# Patient Record
Sex: Female | Born: 1940 | Race: White | Hispanic: No | Smoking: Former smoker
Health system: Southern US, Community
[De-identification: ages and names within clinical notes are randomized; demographics above are authoritative.]

## PROBLEM LIST (undated history)

## (undated) DIAGNOSIS — C20 Malignant neoplasm of rectum: Secondary | ICD-10-CM

## (undated) DIAGNOSIS — J449 Chronic obstructive pulmonary disease, unspecified: Secondary | ICD-10-CM

## (undated) DIAGNOSIS — K922 Gastrointestinal hemorrhage, unspecified: Secondary | ICD-10-CM

## (undated) DIAGNOSIS — I10 Essential (primary) hypertension: Secondary | ICD-10-CM

## (undated) HISTORY — PX: HIP ARTHROPLASTY: SHX981

## (undated) HISTORY — PX: COLONOSCOPY: SHX174

## (undated) HISTORY — PX: RECTAL SURGERY: SHX760

---

## 2001-04-18 ENCOUNTER — Encounter: Payer: Self-pay | Admitting: Orthopedic Surgery

## 2001-04-18 ENCOUNTER — Ambulatory Visit: Admission: RE | Admit: 2001-04-18 | Discharge: 2001-04-18 | Payer: Self-pay | Admitting: Orthopedic Surgery

## 2001-04-28 ENCOUNTER — Ambulatory Visit (HOSPITAL_COMMUNITY): Admission: RE | Admit: 2001-04-28 | Discharge: 2001-04-28 | Payer: Self-pay | Admitting: Nephrology

## 2001-04-28 ENCOUNTER — Encounter: Payer: Self-pay | Admitting: Nephrology

## 2001-04-30 ENCOUNTER — Encounter: Payer: Self-pay | Admitting: Nephrology

## 2001-04-30 ENCOUNTER — Ambulatory Visit (HOSPITAL_COMMUNITY): Admission: RE | Admit: 2001-04-30 | Discharge: 2001-04-30 | Payer: Self-pay | Admitting: Nephrology

## 2006-12-23 ENCOUNTER — Ambulatory Visit: Payer: Self-pay | Admitting: Internal Medicine

## 2006-12-23 ENCOUNTER — Inpatient Hospital Stay (HOSPITAL_COMMUNITY): Admission: EM | Admit: 2006-12-23 | Discharge: 2007-01-17 | Payer: Self-pay | Admitting: Emergency Medicine

## 2006-12-24 ENCOUNTER — Encounter (INDEPENDENT_AMBULATORY_CARE_PROVIDER_SITE_OTHER): Payer: Self-pay | Admitting: Cardiology

## 2006-12-25 ENCOUNTER — Encounter (INDEPENDENT_AMBULATORY_CARE_PROVIDER_SITE_OTHER): Payer: Self-pay | Admitting: Specialist

## 2006-12-27 ENCOUNTER — Encounter (INDEPENDENT_AMBULATORY_CARE_PROVIDER_SITE_OTHER): Payer: Self-pay | Admitting: *Deleted

## 2006-12-30 ENCOUNTER — Ambulatory Visit: Payer: Self-pay | Admitting: Infectious Diseases

## 2007-01-09 ENCOUNTER — Ambulatory Visit: Payer: Self-pay | Admitting: Oncology

## 2007-01-14 ENCOUNTER — Encounter: Payer: Self-pay | Admitting: Oncology

## 2007-01-20 ENCOUNTER — Ambulatory Visit: Admission: RE | Admit: 2007-01-20 | Discharge: 2007-03-16 | Payer: Self-pay | Admitting: Radiation Oncology

## 2007-01-21 ENCOUNTER — Encounter: Payer: Self-pay | Admitting: Vascular Surgery

## 2007-01-21 ENCOUNTER — Ambulatory Visit (HOSPITAL_COMMUNITY): Admission: RE | Admit: 2007-01-21 | Discharge: 2007-01-21 | Payer: Self-pay | Admitting: Radiation Oncology

## 2007-01-21 ENCOUNTER — Ambulatory Visit: Payer: Self-pay | Admitting: Vascular Surgery

## 2007-01-21 LAB — CBC WITH DIFFERENTIAL/PLATELET
BASO%: 1.3 % (ref 0.0–2.0)
Eosinophils Absolute: 0.4 10*3/uL (ref 0.0–0.5)
LYMPH%: 15.7 % (ref 14.0–48.0)
MCHC: 32.3 g/dL (ref 32.0–36.0)
MONO#: 0.8 10*3/uL (ref 0.1–0.9)
NEUT#: 4.9 10*3/uL (ref 1.5–6.5)
Platelets: 147 10*3/uL (ref 145–400)
RBC: 4.23 10*6/uL (ref 3.70–5.32)
WBC: 7.4 10*3/uL (ref 3.9–10.0)
lymph#: 1.2 10*3/uL (ref 0.9–3.3)

## 2007-01-28 ENCOUNTER — Ambulatory Visit: Payer: Self-pay | Admitting: Oncology

## 2007-01-28 LAB — CBC & DIFF AND RETIC
BASO%: 1.5 % (ref 0.0–2.0)
Basophils Absolute: 0.1 10*3/uL (ref 0.0–0.1)
Eosinophils Absolute: 0.1 10*3/uL (ref 0.0–0.5)
HCT: 39.3 % (ref 34.8–46.6)
HGB: 12.6 g/dL (ref 11.6–15.9)
IRF: 0.4 — ABNORMAL HIGH (ref 0.130–0.330)
LYMPH%: 12.3 % — ABNORMAL LOW (ref 14.0–48.0)
MCHC: 32.1 g/dL (ref 32.0–36.0)
MONO#: 0.5 10*3/uL (ref 0.1–0.9)
NEUT#: 3 10*3/uL (ref 1.5–6.5)
NEUT%: 71.1 % (ref 39.6–76.8)
Platelets: 104 10*3/uL — ABNORMAL LOW (ref 145–400)
WBC: 4.2 10*3/uL (ref 3.9–10.0)
lymph#: 0.5 10*3/uL — ABNORMAL LOW (ref 0.9–3.3)

## 2007-01-28 LAB — IRON AND TIBC
Iron: 170 ug/dL — ABNORMAL HIGH (ref 42–145)
TIBC: 406 ug/dL (ref 250–470)

## 2007-01-28 LAB — COMPREHENSIVE METABOLIC PANEL
ALT: 37 U/L — ABNORMAL HIGH (ref 0–35)
CO2: 22 mEq/L (ref 19–32)
Calcium: 8.9 mg/dL (ref 8.4–10.5)
Chloride: 102 mEq/L (ref 96–112)
Creatinine, Ser: 0.69 mg/dL (ref 0.40–1.20)
Glucose, Bld: 112 mg/dL — ABNORMAL HIGH (ref 70–99)
Total Bilirubin: 0.9 mg/dL (ref 0.3–1.2)
Total Protein: 6.7 g/dL (ref 6.0–8.3)

## 2007-01-28 LAB — FERRITIN: Ferritin: 173 ng/mL (ref 10–291)

## 2007-01-28 LAB — CHCC SMEAR

## 2007-01-28 LAB — MORPHOLOGY

## 2007-02-04 LAB — COMPREHENSIVE METABOLIC PANEL
AST: 32 U/L (ref 0–37)
Alkaline Phosphatase: 176 U/L — ABNORMAL HIGH (ref 39–117)
BUN: 29 mg/dL — ABNORMAL HIGH (ref 6–23)
Creatinine, Ser: 0.85 mg/dL (ref 0.40–1.20)
Total Bilirubin: 0.9 mg/dL (ref 0.3–1.2)

## 2007-02-04 LAB — CBC WITH DIFFERENTIAL/PLATELET
BASO%: 0 % (ref 0.0–2.0)
Basophils Absolute: 0 10*3/uL (ref 0.0–0.1)
EOS%: 1.6 % (ref 0.0–7.0)
HCT: 36.7 % (ref 34.8–46.6)
HGB: 12.5 g/dL (ref 11.6–15.9)
MCH: 30.1 pg (ref 26.0–34.0)
MCHC: 34 g/dL (ref 32.0–36.0)
MCV: 88.3 fL (ref 81.0–101.0)
MONO%: 13.7 % — ABNORMAL HIGH (ref 0.0–13.0)
NEUT%: 72.5 % (ref 39.6–76.8)

## 2007-02-11 LAB — CBC WITH DIFFERENTIAL/PLATELET
Basophils Absolute: 0 10*3/uL (ref 0.0–0.1)
EOS%: 1.9 % (ref 0.0–7.0)
HCT: 36.5 % (ref 34.8–46.6)
HGB: 12.6 g/dL (ref 11.6–15.9)
LYMPH%: 6.5 % — ABNORMAL LOW (ref 14.0–48.0)
MCH: 31 pg (ref 26.0–34.0)
MCV: 90.2 fL (ref 81.0–101.0)
MONO%: 10.3 % (ref 0.0–13.0)
NEUT%: 81.3 % — ABNORMAL HIGH (ref 39.6–76.8)
Platelets: 102 10*3/uL — ABNORMAL LOW (ref 145–400)
lymph#: 0.3 10*3/uL — ABNORMAL LOW (ref 0.9–3.3)

## 2007-02-11 LAB — COMPREHENSIVE METABOLIC PANEL
AST: 31 U/L (ref 0–37)
BUN: 22 mg/dL (ref 6–23)
Calcium: 8.5 mg/dL (ref 8.4–10.5)
Chloride: 105 mEq/L (ref 96–112)
Creatinine, Ser: 0.77 mg/dL (ref 0.40–1.20)

## 2007-02-20 LAB — COMPREHENSIVE METABOLIC PANEL
BUN: 19 mg/dL (ref 6–23)
CO2: 22 mEq/L (ref 19–32)
Calcium: 8.7 mg/dL (ref 8.4–10.5)
Chloride: 107 mEq/L (ref 96–112)
Creatinine, Ser: 0.77 mg/dL (ref 0.40–1.20)

## 2007-02-20 LAB — CBC WITH DIFFERENTIAL/PLATELET
Basophils Absolute: 0 10*3/uL (ref 0.0–0.1)
HCT: 34 % — ABNORMAL LOW (ref 34.8–46.6)
HGB: 11.7 g/dL (ref 11.6–15.9)
MCH: 31.5 pg (ref 26.0–34.0)
MONO#: 0.3 10*3/uL (ref 0.1–0.9)
NEUT%: 82.8 % — ABNORMAL HIGH (ref 39.6–76.8)
WBC: 3.2 10*3/uL — ABNORMAL LOW (ref 3.9–10.0)
lymph#: 0.1 10*3/uL — ABNORMAL LOW (ref 0.9–3.3)

## 2007-02-28 LAB — BASIC METABOLIC PANEL
BUN: 20 mg/dL (ref 6–23)
CO2: 23 mEq/L (ref 19–32)
Chloride: 106 mEq/L (ref 96–112)
Creatinine, Ser: 0.79 mg/dL (ref 0.40–1.20)

## 2007-02-28 LAB — CBC WITH DIFFERENTIAL/PLATELET
BASO%: 0 % (ref 0.0–2.0)
Basophils Absolute: 0 10*3/uL (ref 0.0–0.1)
HCT: 34.6 % — ABNORMAL LOW (ref 34.8–46.6)
HGB: 12.3 g/dL (ref 11.6–15.9)
MONO#: 0.5 10*3/uL (ref 0.1–0.9)
NEUT%: 80.5 % — ABNORMAL HIGH (ref 39.6–76.8)
WBC: 3.7 10*3/uL — ABNORMAL LOW (ref 3.9–10.0)
lymph#: 0.1 10*3/uL — ABNORMAL LOW (ref 0.9–3.3)

## 2007-03-07 LAB — CBC WITH DIFFERENTIAL/PLATELET
Basophils Absolute: 0 10*3/uL (ref 0.0–0.1)
EOS%: 2.2 % (ref 0.0–7.0)
HGB: 12.3 g/dL (ref 11.6–15.9)
LYMPH%: 6.2 % — ABNORMAL LOW (ref 14.0–48.0)
MCH: 33 pg (ref 26.0–34.0)
MCV: 93.5 fL (ref 81.0–101.0)
MONO%: 8.4 % (ref 0.0–13.0)
Platelets: 116 10*3/uL — ABNORMAL LOW (ref 145–400)
RDW: 30 % — ABNORMAL HIGH (ref 11.3–14.5)

## 2007-03-07 LAB — COMPREHENSIVE METABOLIC PANEL
AST: 34 U/L (ref 0–37)
Alkaline Phosphatase: 183 U/L — ABNORMAL HIGH (ref 39–117)
BUN: 19 mg/dL (ref 6–23)
Creatinine, Ser: 0.7 mg/dL (ref 0.40–1.20)
Potassium: 3.5 mEq/L (ref 3.5–5.3)
Total Bilirubin: 0.9 mg/dL (ref 0.3–1.2)

## 2007-03-12 ENCOUNTER — Ambulatory Visit: Payer: Self-pay | Admitting: Oncology

## 2007-03-14 ENCOUNTER — Ambulatory Visit (HOSPITAL_COMMUNITY): Admission: RE | Admit: 2007-03-14 | Discharge: 2007-03-14 | Payer: Self-pay | Admitting: Oncology

## 2007-03-14 LAB — CBC WITH DIFFERENTIAL/PLATELET
Basophils Absolute: 0 10*3/uL (ref 0.0–0.1)
EOS%: 2.4 % (ref 0.0–7.0)
Eosinophils Absolute: 0.1 10*3/uL (ref 0.0–0.5)
HCT: 33.5 % — ABNORMAL LOW (ref 34.8–46.6)
HGB: 11.8 g/dL (ref 11.6–15.9)
MCH: 34 pg (ref 26.0–34.0)
MCV: 96.7 fL (ref 81.0–101.0)
MONO%: 7.9 % (ref 0.0–13.0)
NEUT#: 4 10*3/uL (ref 1.5–6.5)
NEUT%: 82.6 % — ABNORMAL HIGH (ref 39.6–76.8)
Platelets: 135 10*3/uL — ABNORMAL LOW (ref 145–400)

## 2007-03-14 LAB — URINALYSIS, MICROSCOPIC - CHCC
Blood: NEGATIVE
Glucose: NEGATIVE g/dL
Nitrite: NEGATIVE
Protein: <30 mg/dL

## 2007-03-14 LAB — COMPREHENSIVE METABOLIC PANEL
AST: 34 U/L (ref 0–37)
Albumin: 3.3 g/dL — ABNORMAL LOW (ref 3.5–5.2)
Alkaline Phosphatase: 204 U/L — ABNORMAL HIGH (ref 39–117)
BUN: 23 mg/dL (ref 6–23)
Calcium: 8.9 mg/dL (ref 8.4–10.5)
Creatinine, Ser: 0.77 mg/dL (ref 0.40–1.20)
Glucose, Bld: 114 mg/dL — ABNORMAL HIGH (ref 70–99)

## 2007-03-21 LAB — CBC WITH DIFFERENTIAL/PLATELET
Basophils Absolute: 0 10*3/uL (ref 0.0–0.1)
Eosinophils Absolute: 0.1 10*3/uL (ref 0.0–0.5)
HCT: 35 % (ref 34.8–46.6)
LYMPH%: 8.1 % — ABNORMAL LOW (ref 14.0–48.0)
MCV: 98.8 fL (ref 81.0–101.0)
MONO#: 1 10*3/uL — ABNORMAL HIGH (ref 0.1–0.9)
MONO%: 16.9 % — ABNORMAL HIGH (ref 0.0–13.0)
NEUT#: 4.1 10*3/uL (ref 1.5–6.5)
NEUT%: 72.5 % (ref 39.6–76.8)
Platelets: 162 10*3/uL (ref 145–400)
RBC: 3.54 10*6/uL — ABNORMAL LOW (ref 3.70–5.32)
WBC: 5.7 10*3/uL (ref 3.9–10.0)

## 2007-03-21 LAB — COMPREHENSIVE METABOLIC PANEL
Alkaline Phosphatase: 238 U/L — ABNORMAL HIGH (ref 39–117)
BUN: 16 mg/dL (ref 6–23)
CO2: 23 mEq/L (ref 19–32)
Creatinine, Ser: 0.72 mg/dL (ref 0.40–1.20)
Glucose, Bld: 112 mg/dL — ABNORMAL HIGH (ref 70–99)
Sodium: 136 mEq/L (ref 135–145)
Total Bilirubin: 1.2 mg/dL (ref 0.3–1.2)
Total Protein: 6.1 g/dL (ref 6.0–8.3)

## 2007-04-01 ENCOUNTER — Ambulatory Visit: Payer: Self-pay | Admitting: Internal Medicine

## 2007-04-01 ENCOUNTER — Encounter (INDEPENDENT_AMBULATORY_CARE_PROVIDER_SITE_OTHER): Payer: Self-pay | Admitting: Surgery

## 2007-04-01 ENCOUNTER — Inpatient Hospital Stay (HOSPITAL_COMMUNITY): Admission: RE | Admit: 2007-04-01 | Discharge: 2007-05-26 | Payer: Self-pay | Admitting: Surgery

## 2007-04-21 ENCOUNTER — Encounter: Payer: Self-pay | Admitting: Surgery

## 2007-04-22 ENCOUNTER — Encounter (INDEPENDENT_AMBULATORY_CARE_PROVIDER_SITE_OTHER): Payer: Self-pay | Admitting: Surgery

## 2007-05-13 ENCOUNTER — Ambulatory Visit: Payer: Self-pay | Admitting: Physical Medicine & Rehabilitation

## 2007-05-26 ENCOUNTER — Ambulatory Visit: Payer: Self-pay | Admitting: Vascular Surgery

## 2007-07-08 ENCOUNTER — Ambulatory Visit: Payer: Self-pay | Admitting: Oncology

## 2007-07-10 LAB — CBC WITH DIFFERENTIAL/PLATELET
Basophils Absolute: 0.1 10*3/uL (ref 0.0–0.1)
Eosinophils Absolute: 0.1 10*3/uL (ref 0.0–0.5)
HCT: 33.2 % — ABNORMAL LOW (ref 34.8–46.6)
HGB: 11.1 g/dL — ABNORMAL LOW (ref 11.6–15.9)
LYMPH%: 22 % (ref 14.0–48.0)
MCV: 81.6 fL (ref 81.0–101.0)
MONO#: 0.4 10*3/uL (ref 0.1–0.9)
MONO%: 8.6 % (ref 0.0–13.0)
NEUT#: 2.8 10*3/uL (ref 1.5–6.5)
Platelets: 238 10*3/uL (ref 145–400)
RBC: 4.07 10*6/uL (ref 3.70–5.32)
WBC: 4.2 10*3/uL (ref 3.9–10.0)

## 2007-07-10 LAB — COMPREHENSIVE METABOLIC PANEL
ALT: 17 U/L (ref 0–35)
Albumin: 3 g/dL — ABNORMAL LOW (ref 3.5–5.2)
CO2: 22 mEq/L (ref 19–32)
Calcium: 9 mg/dL (ref 8.4–10.5)
Chloride: 106 mEq/L (ref 96–112)
Potassium: 4.1 mEq/L (ref 3.5–5.3)
Sodium: 139 mEq/L (ref 135–145)
Total Protein: 6.5 g/dL (ref 6.0–8.3)

## 2007-07-24 ENCOUNTER — Encounter: Admission: RE | Admit: 2007-07-24 | Discharge: 2007-07-24 | Payer: Self-pay | Admitting: Surgery

## 2007-09-02 ENCOUNTER — Inpatient Hospital Stay (HOSPITAL_COMMUNITY): Admission: RE | Admit: 2007-09-02 | Discharge: 2007-09-07 | Payer: Self-pay | Admitting: Surgery

## 2007-09-23 ENCOUNTER — Ambulatory Visit: Payer: Self-pay | Admitting: Oncology

## 2007-09-29 LAB — COMPREHENSIVE METABOLIC PANEL
ALT: 13 U/L (ref 0–35)
AST: 27 U/L (ref 0–37)
CO2: 22 mEq/L (ref 19–32)
Chloride: 106 mEq/L (ref 96–112)
Sodium: 138 mEq/L (ref 135–145)
Total Bilirubin: 0.5 mg/dL (ref 0.3–1.2)
Total Protein: 6.6 g/dL (ref 6.0–8.3)

## 2007-09-29 LAB — CBC WITH DIFFERENTIAL/PLATELET
BASO%: 0.8 % (ref 0.0–2.0)
EOS%: 1.2 % (ref 0.0–7.0)
MCHC: 34.3 g/dL (ref 32.0–36.0)
MONO#: 0.5 10*3/uL (ref 0.1–0.9)
RBC: 4.14 10*6/uL (ref 3.70–5.32)
WBC: 3.8 10*3/uL — ABNORMAL LOW (ref 3.9–10.0)
lymph#: 0.8 10*3/uL — ABNORMAL LOW (ref 0.9–3.3)

## 2007-09-29 LAB — LACTATE DEHYDROGENASE: LDH: 148 U/L (ref 94–250)

## 2007-10-03 ENCOUNTER — Ambulatory Visit (HOSPITAL_COMMUNITY): Admission: RE | Admit: 2007-10-03 | Discharge: 2007-10-03 | Payer: Self-pay | Admitting: Oncology

## 2008-01-27 ENCOUNTER — Ambulatory Visit: Payer: Self-pay | Admitting: Oncology

## 2008-01-29 ENCOUNTER — Ambulatory Visit (HOSPITAL_COMMUNITY): Admission: RE | Admit: 2008-01-29 | Discharge: 2008-01-29 | Payer: Self-pay | Admitting: Oncology

## 2008-01-29 LAB — COMPREHENSIVE METABOLIC PANEL
ALT: 32 U/L (ref 0–35)
AST: 40 U/L — ABNORMAL HIGH (ref 0–37)
BUN: 14 mg/dL (ref 6–23)
CO2: 24 mEq/L (ref 19–32)
Creatinine, Ser: 0.79 mg/dL (ref 0.40–1.20)
Total Bilirubin: 0.7 mg/dL (ref 0.3–1.2)

## 2008-01-29 LAB — CBC WITH DIFFERENTIAL/PLATELET
BASO%: 0.5 % (ref 0.0–2.0)
Basophils Absolute: 0 10*3/uL (ref 0.0–0.1)
EOS%: 2.6 % (ref 0.0–7.0)
HCT: 40 % (ref 34.8–46.6)
LYMPH%: 21.1 % (ref 14.0–48.0)
MCH: 31.7 pg (ref 26.0–34.0)
MCHC: 34.7 g/dL (ref 32.0–36.0)
NEUT%: 64.6 % (ref 39.6–76.8)
Platelets: 134 10*3/uL — ABNORMAL LOW (ref 145–400)

## 2008-01-29 LAB — FECAL OCCULT BLOOD, GUAIAC

## 2008-01-29 LAB — LACTATE DEHYDROGENASE: LDH: 157 U/L (ref 94–250)

## 2008-02-27 IMAGING — CT CT ABDOMEN W/ CM
2 of 5 series · 16 of 46 positions shown, 18 images · IV contrast ([ID]/WATER & 80 ML OMNI 300)
Comparison: Plain films 04/26/2007. CT of 04/14/2007 and 04/11/2007.

ABDOMEN CT WITH CONTRAST

CLINICAL DATA: Evaluate abscess drain placed on [DATE]. History of rectal cancer.
Dehisced colorectal anastomosis. History of CHF.
TECHNIQUE: Multidetector CT imaging of the abdomen and pelvis was performed
following the standard protocol during bolus administration of intravenous
contrast.

Contrast:  80 cc Omnipaque 300

[Series 2: routine abdomen · axial · 0.89mm/px · z∈[-348,+22]mm · 13 of 84 slices shown, 15 images]
[im 5/84  soft-tissue]
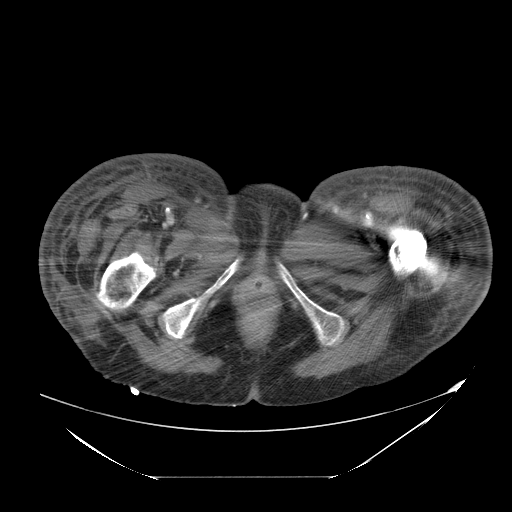
[im 5/84  bone]
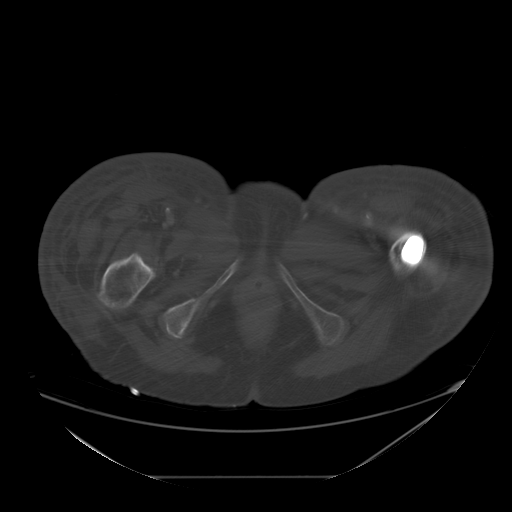
[im 10/84  soft-tissue]
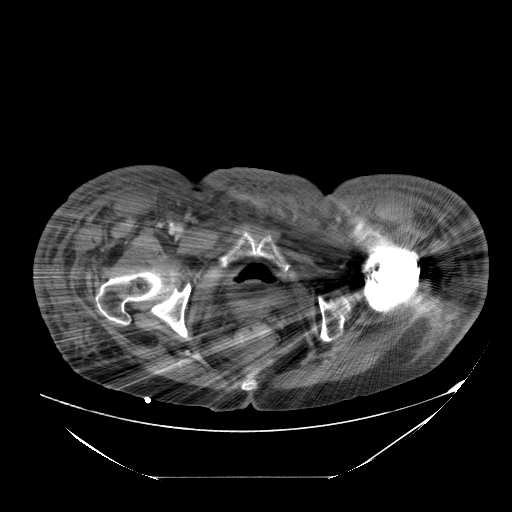
[im 19/84  soft-tissue]
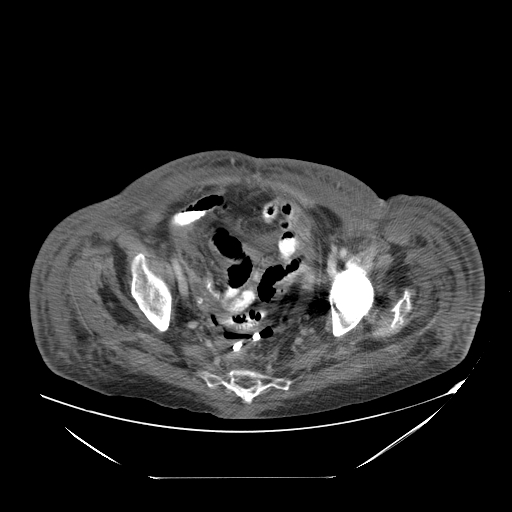
[im 24/84  soft-tissue]
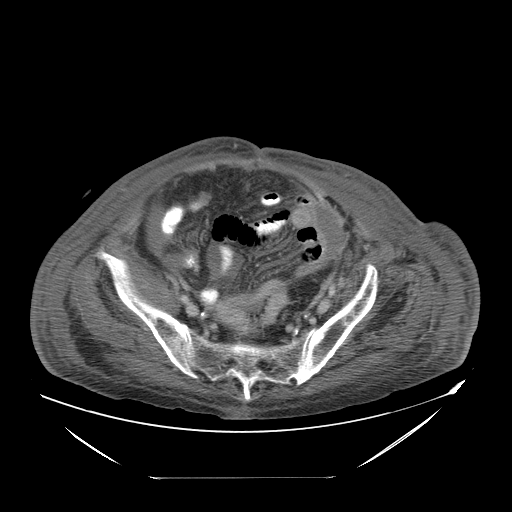
[im 28/84  soft-tissue]
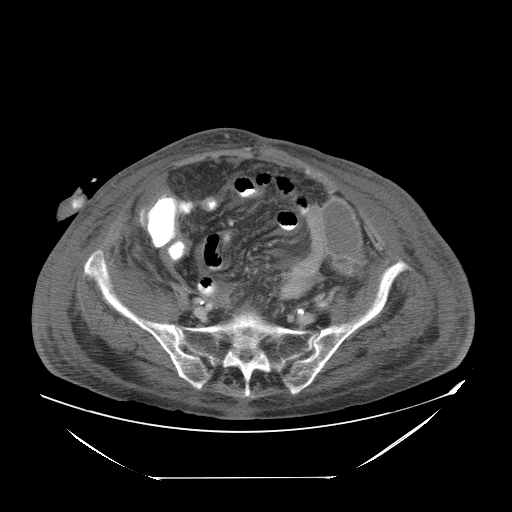
[im 37/84  soft-tissue]
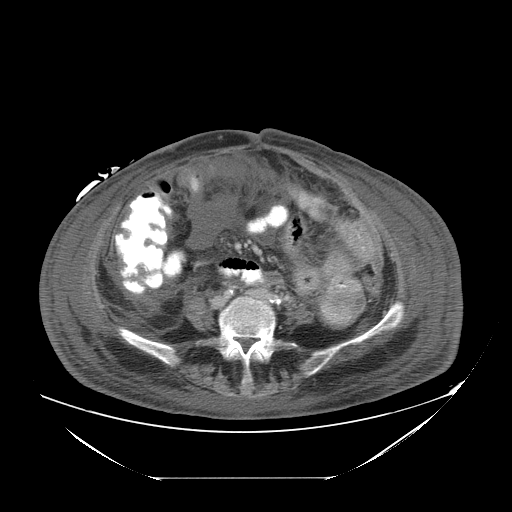
[im 42/84  soft-tissue]
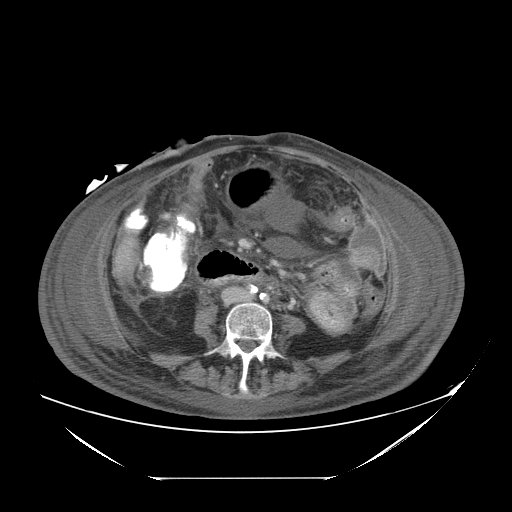
[im 47/84  soft-tissue]
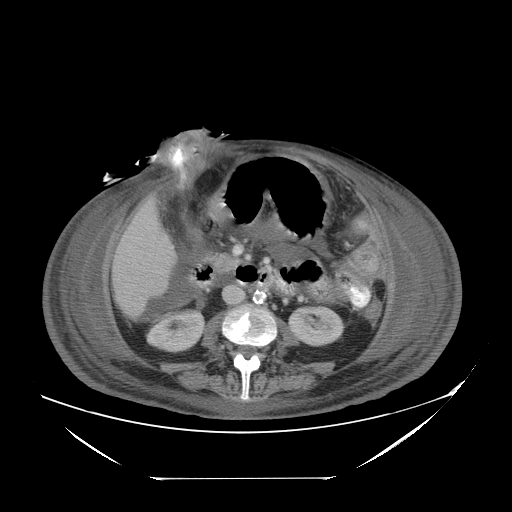
[im 56/84  soft-tissue]
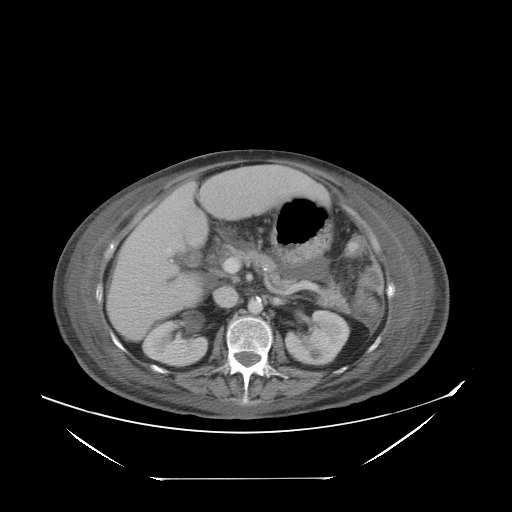
[im 56/84  bone]
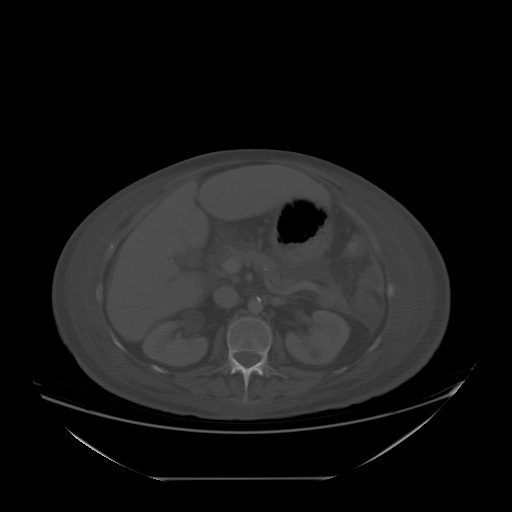
[im 60/84  soft-tissue]
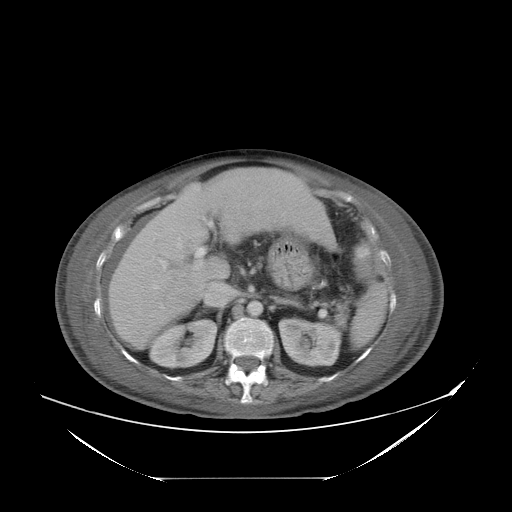
[im 65/84  soft-tissue]
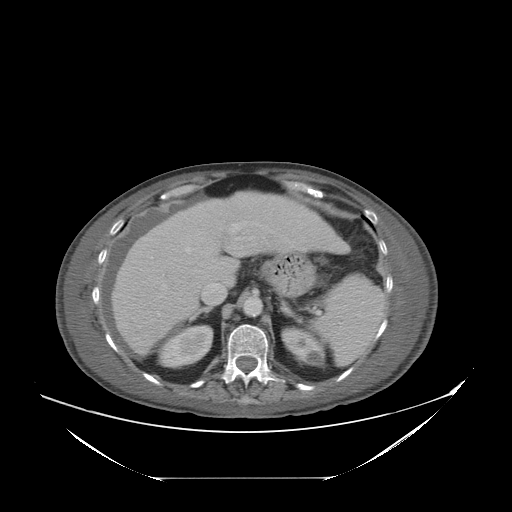
[im 74/84  soft-tissue]
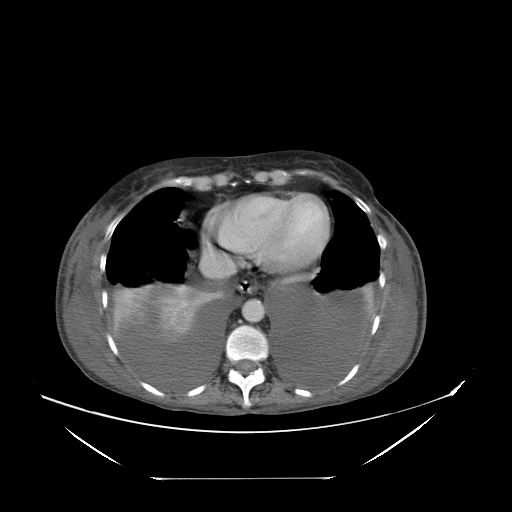
[im 79/84  soft-tissue]
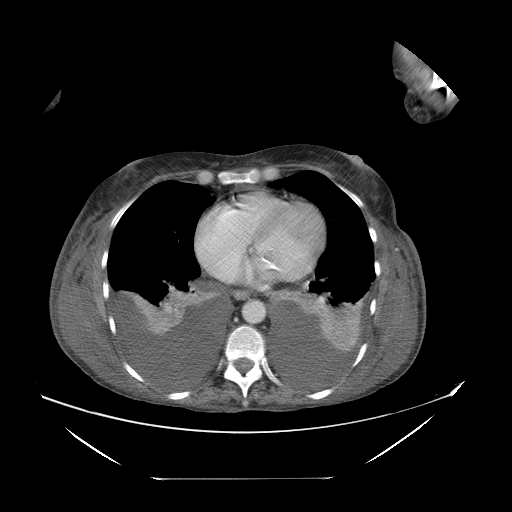

[Series 401: cor abd · coronal · 0.88mm/px · 3 of 131 slices shown]
[im 44/131  soft-tissue]
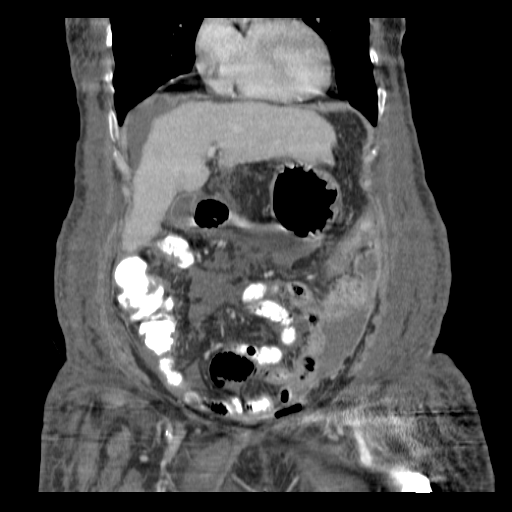
[im 58/131  soft-tissue]
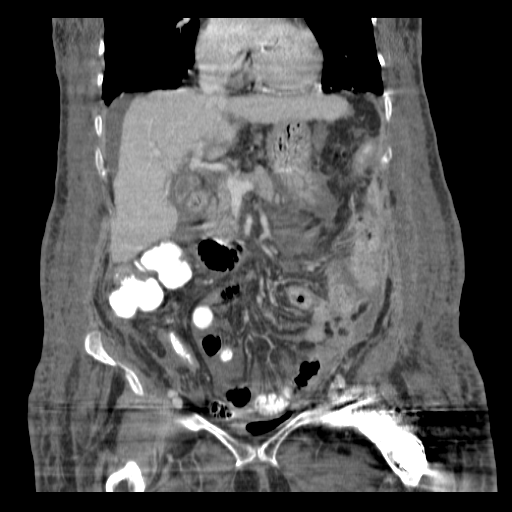
[im 73/131  soft-tissue]
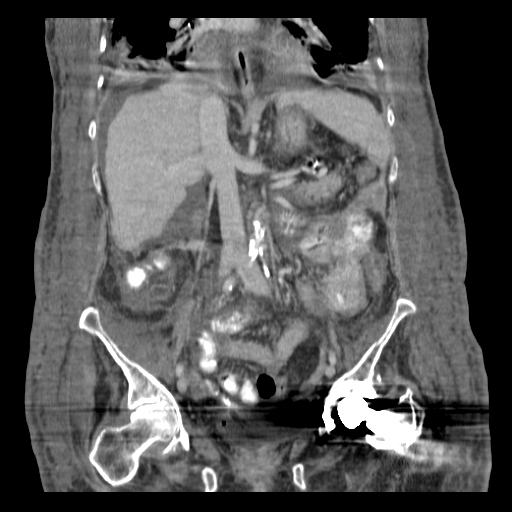

[16 of 46 positions shown; findings below may reference images not displayed]

FINDINGS: Bibasilar atelectasis. Increased left and new right pleural effusion,
both moderate. Normal heart size without pericardial effusion. Diffuse anasarca.

Findings cirrhosis again identified. No evidence of hepatocellular carcinoma.
Hepatic and portal veins patent.

Similar small amount of perihepatic ascites. Subtle areas of peritoneal
thickening anteriorly on image 37 and posteriorly on image 36. Normal spleen,
stomach, pancreas, and gallbladder.

Normal adrenal glands. Right extrarenal pelvis but no hydronephrosis. Bilateral
to small to characterize renal lesions. No retroperitoneal or retro crural
lymphadenopathy.

wall thickening and pericolonic edema involving the transverse colon on image
49-54. There is a right upper quadrant colostomy.

3 small left paracolic gutter peripherally enhancing collections consistent with
infection. A 3.5 x 3.4 cm collection image 33. Possibly contiguous is a smaller
3.1 x 5.1 cm collection imaged 44. Finally, the level of the pelvic brim is a
5.4 x 3.4 cm collection on image 57. Ill-defined interloop mesenteric fluid. No
bowel obstruction.

IMPRESSION

1. Development of 3 small left-sided collections suspicious for early abscess.
These [DATE]. Cirrhosis.
3. Transverse colonic wall thickening could relate to colitis. This is mild.
Ischemia or infection should be considered.
4. Increased bilateral pleural effusions.
5. Similar amount of abdominal ascites.
6. Areas of peritoneal thickening adjacent to the liver. Recommend attention on
followup to exclude infected ascites in these areas.

PELVIS CT WITH CONTRAST
FINDINGS: Perirectal catheter again identified. No residual fluid collection.
Extensive air surrounds the catheter. Beam hardening artifact from left hip
arthroplasty. Pelvic ascites without drainable collection. Foley catheter within
the urinary bladder.

Moderate osteopenia. T11 compression fracture mild.
IMPRESSION

1. Percutaneous drain in place without residual abscess.
2. Pelvic ascites without pelvic abscess. 

I called this report to the patient's nurse,Chapeco, at [DATE] a.m. on 04/29/2007.

## 2008-02-29 IMAGING — CR DG CHEST 1V
1 series · 1 of 1 positions shown · non-contrast
Comparison: 04/17/07.

CLINICAL DATA: Post thoracentesis.
 CHEST ? 1 VIEW:

[view not recorded]
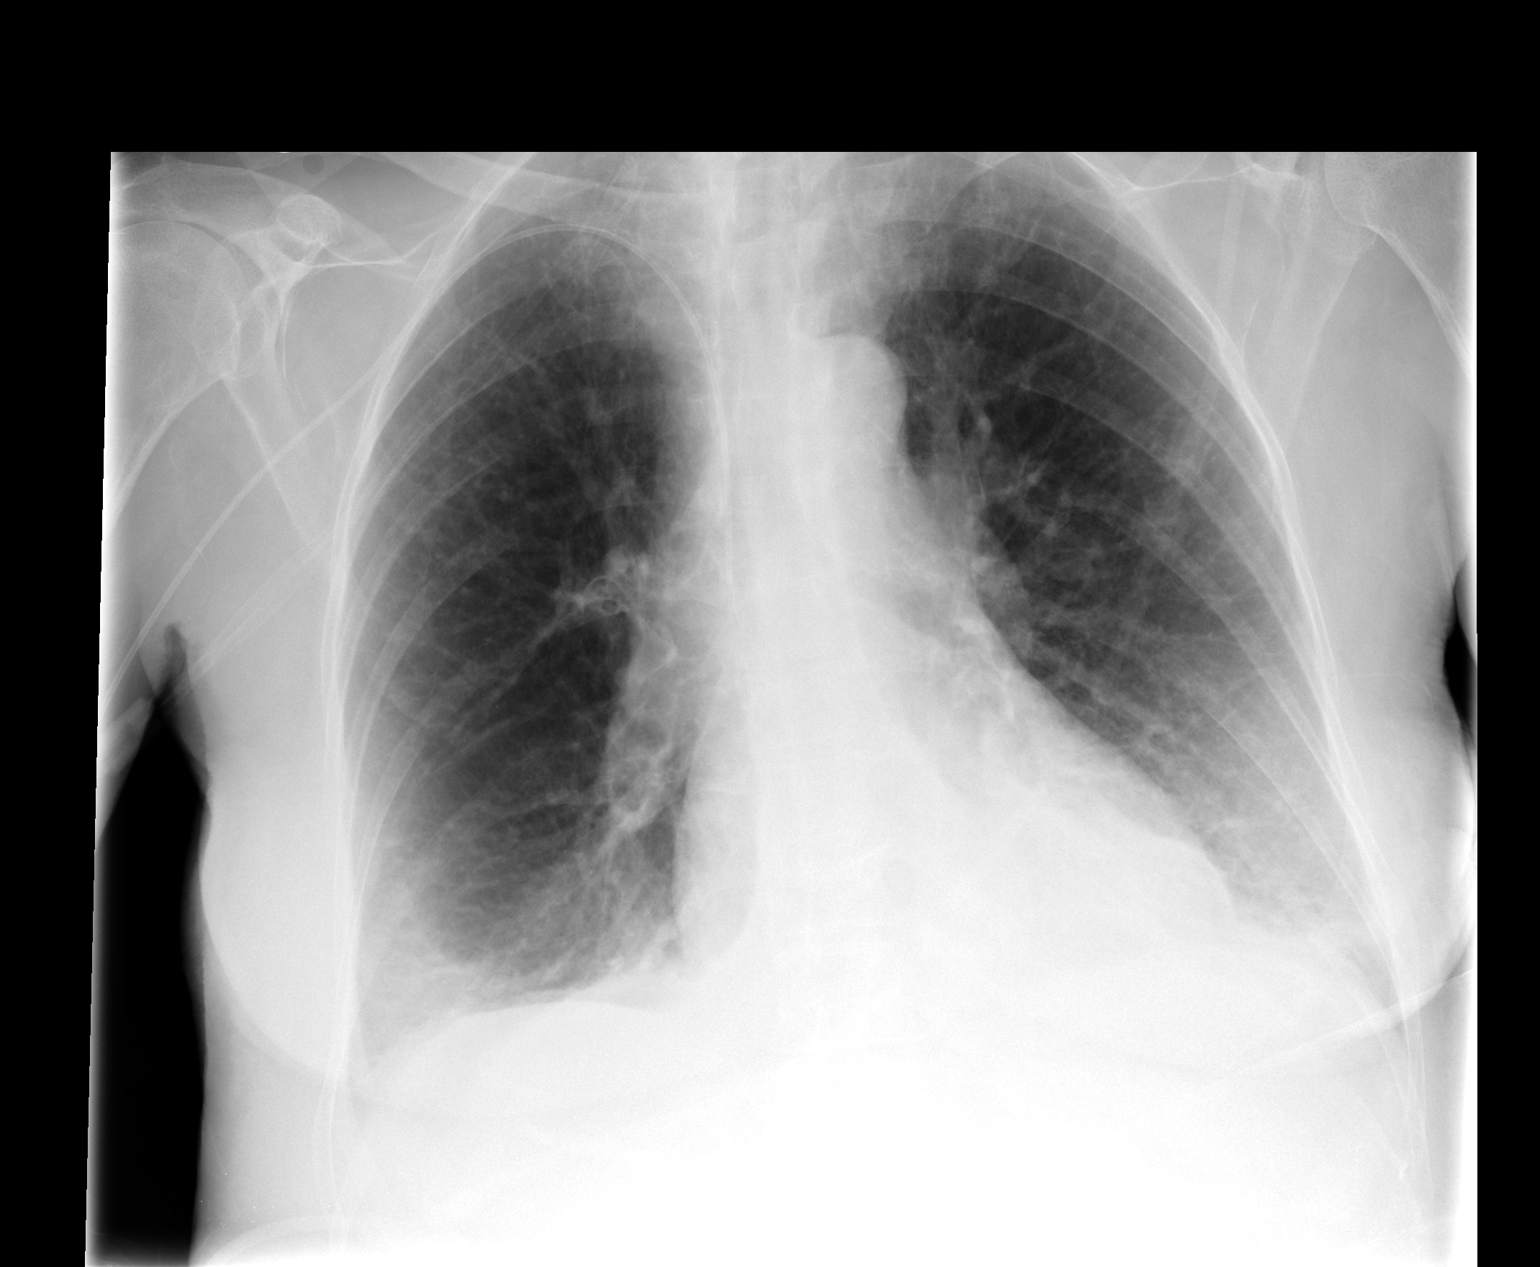

[1 of 1 positions shown; findings below may reference images not displayed]

FINDINGS: No pneumothorax.  Persistent density in the left lower lobe.  Right upper extremity PICC line in the SVC.
IMPRESSION: 1.  No pneumothorax. 
 2.  Persistent left lower lobe density.

## 2008-03-05 IMAGING — CT CT PELVIS LIMITED W/O CM
1 series · 16 of 32 positions shown, 20 images · non-contrast
Comparison: none

CLINICAL DATA: Evaluate catheter placement status-post drain repositioning in interventional radiology. The patient has a left lower quadrant abscess and a catheter was manipulated into a loculated collection.
LIMITED CT PELVIS WITHOUT CONTRAST ? 05/06/07:
TECHNIQUE: Contiguous axial images of the pelvis without intravenous contrast.

[Series 2: pelvis · axial · 0.89mm/px · z∈[-375,-195]mm · 16 of 41 slices shown, 20 images]
[im 3/41  soft-tissue]
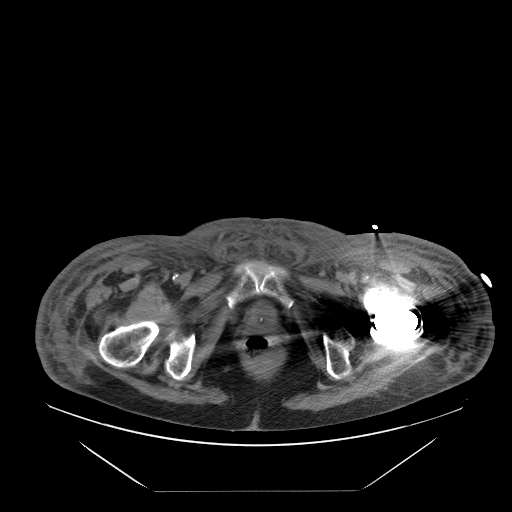
[im 3/41  bone]
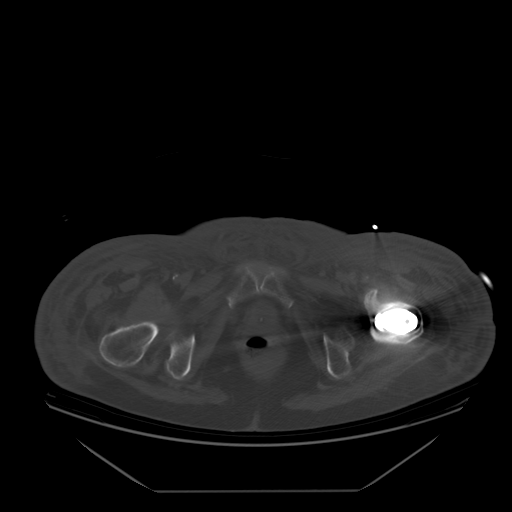
[im 6/41  soft-tissue]
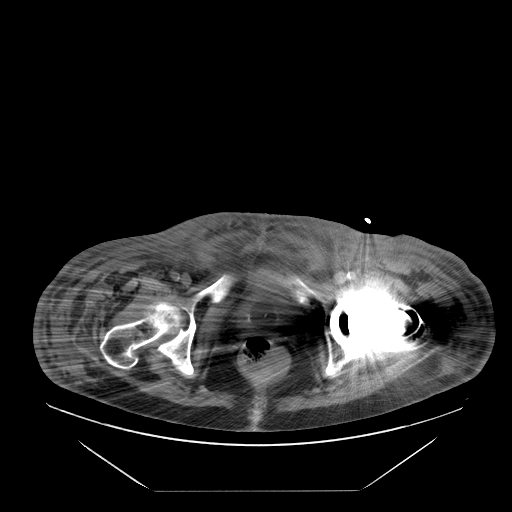
[im 8/41  soft-tissue]
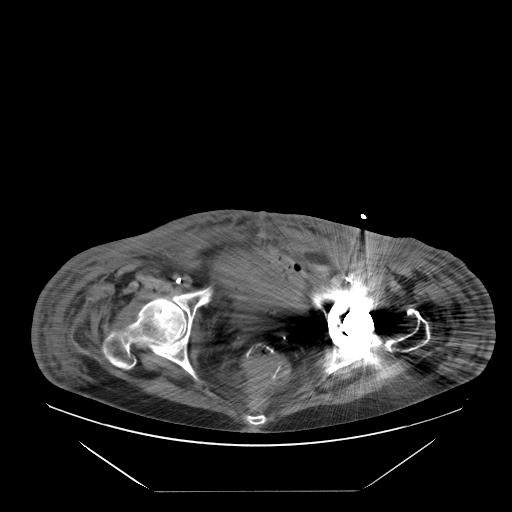
[im 11/41  soft-tissue]
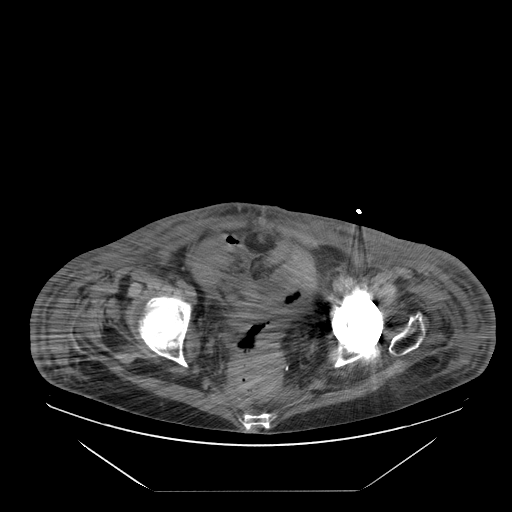
[im 13/41  soft-tissue]
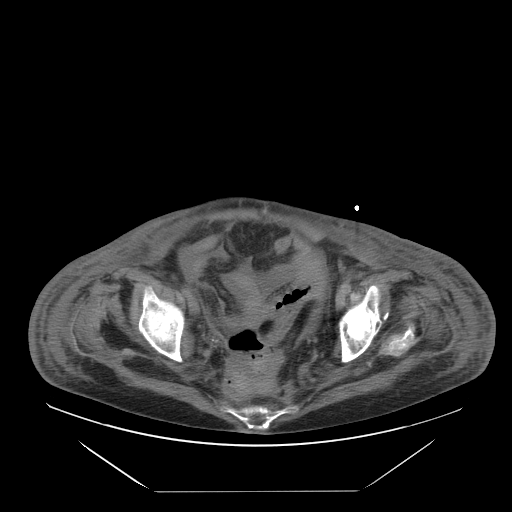
[im 16/41  soft-tissue]
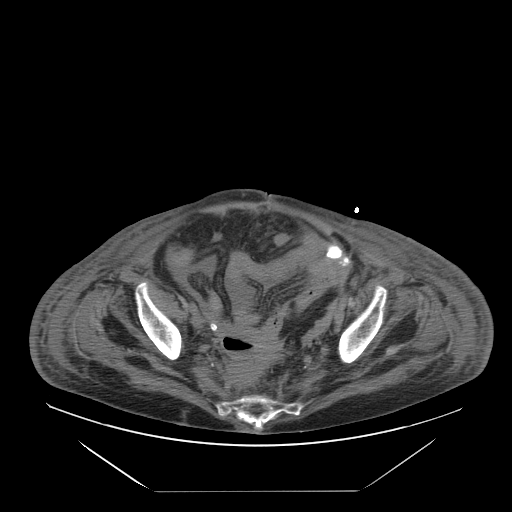
[im 19/41  soft-tissue]
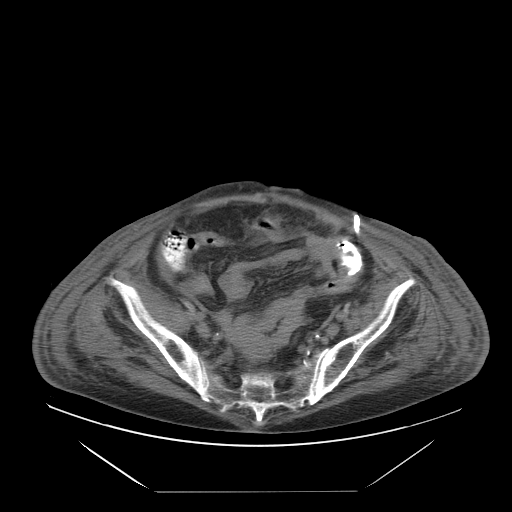
[im 22/41  soft-tissue]
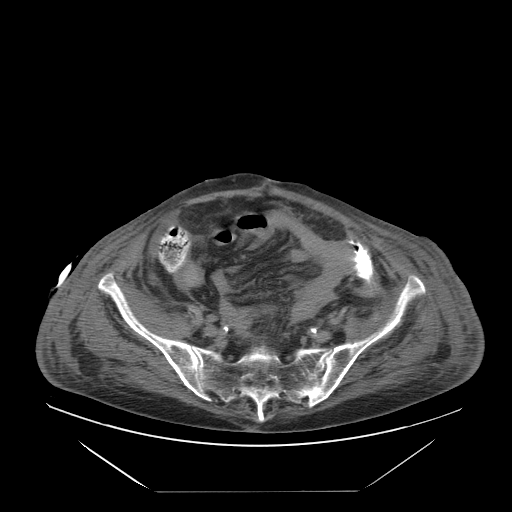
[im 25/41  soft-tissue]
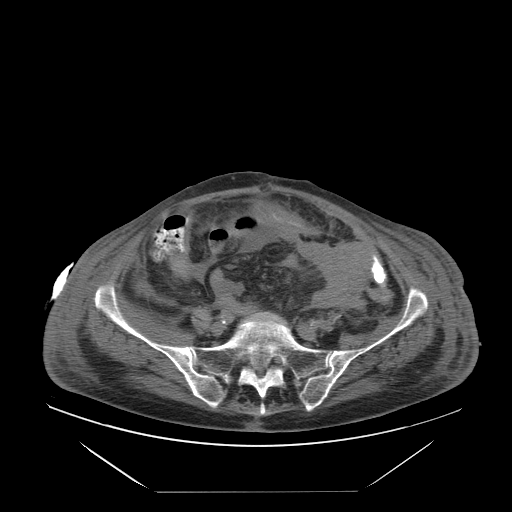
[im 25/41  bone]
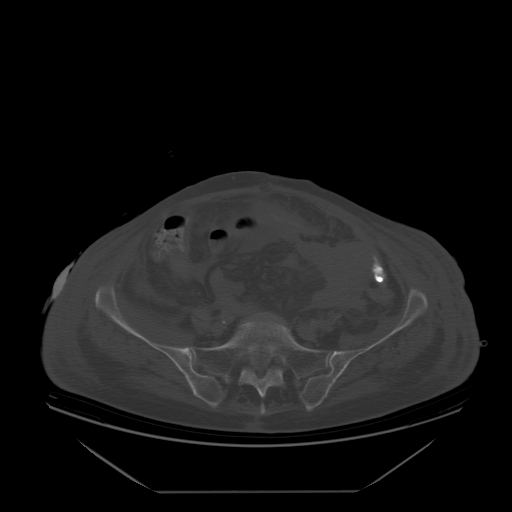
[im 28/41  soft-tissue]
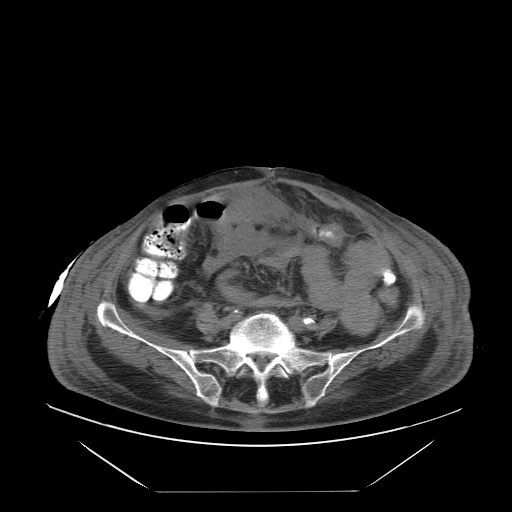
[im 30/41  soft-tissue]
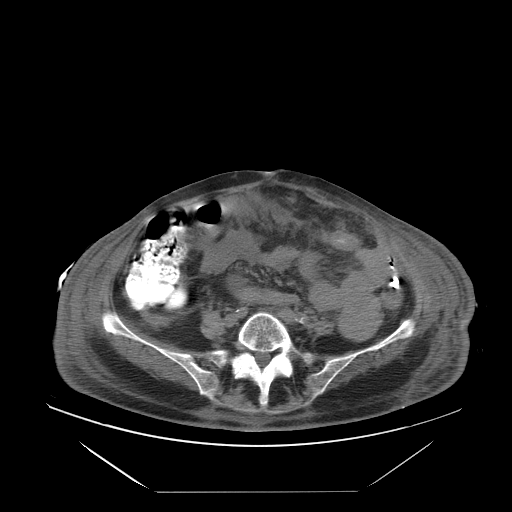
[im 33/41  soft-tissue]
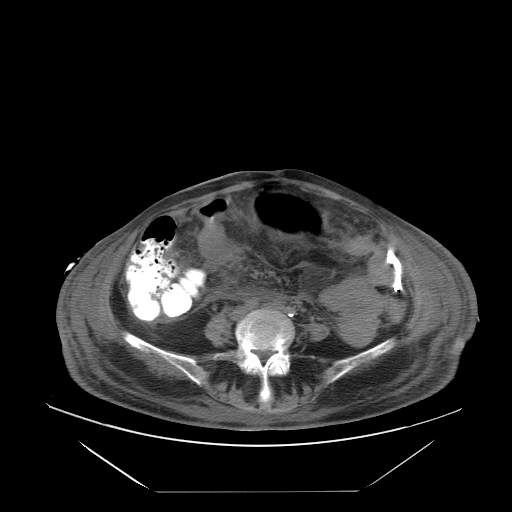
[im 35/41  soft-tissue]
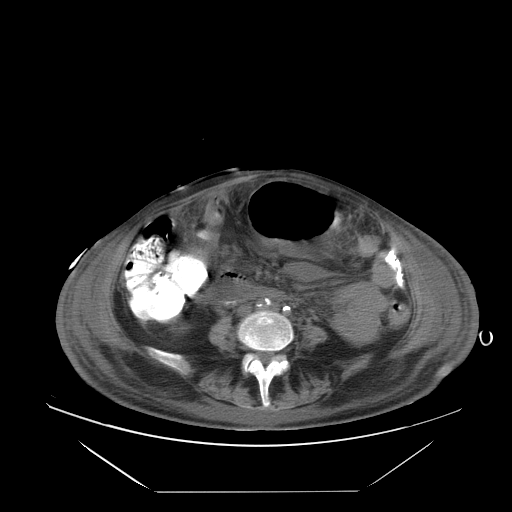
[im 35/41  lung]
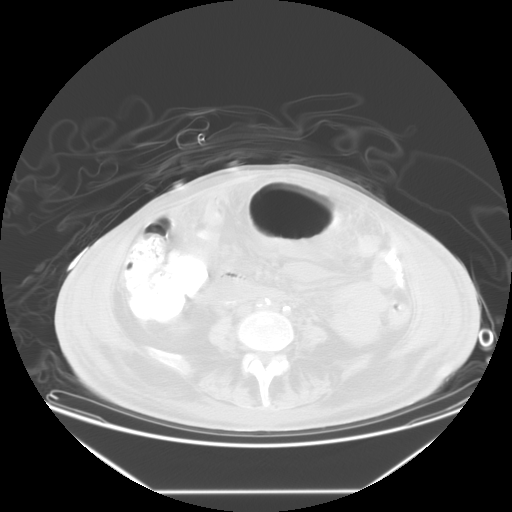
[im 37/41  lung]
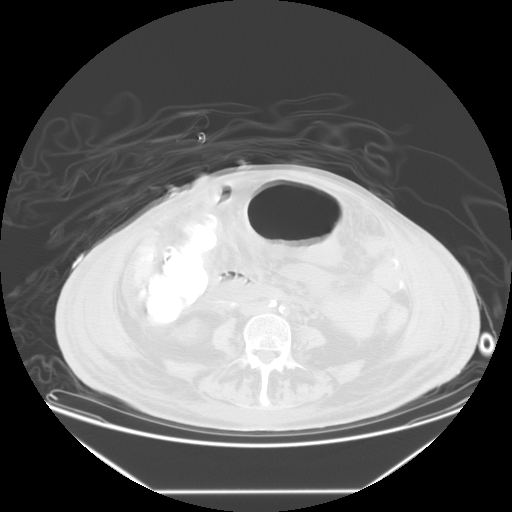
[im 38/41  soft-tissue]
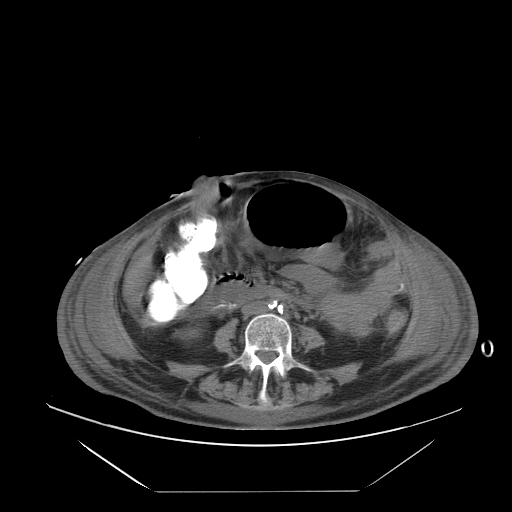
[im 38/41  lung]
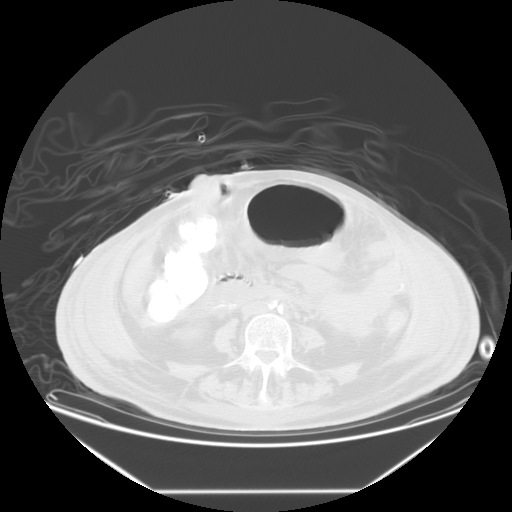
[im 39/41  lung]
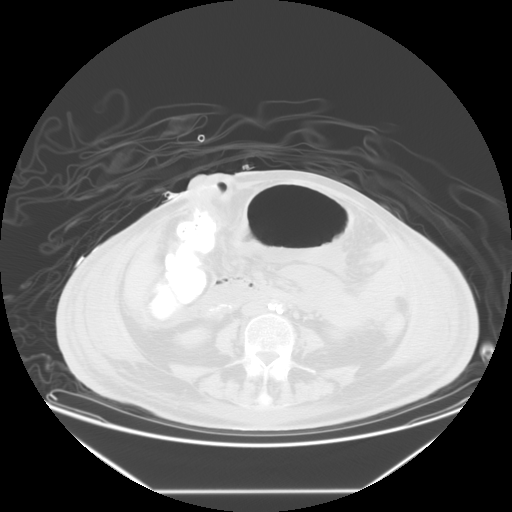

[16 of 32 positions shown; findings below may reference images not displayed]

FINDINGS: The patient still has a drainage catheter in the left lower quadrant.  The cavity is opacified with contrast from a recent interventional radiology procedure.  The catheter has been advanced more cephalad into the adjacent loculated components.  Again seen is residual contrast within the colon and coming out of the patient?s colostomy.  The patient continues to have some fluid throughout the abdominal mesentery with indeterminate fluid and gas in the pelvis near the patient?s surgical anastomosis.
IMPRESSION: Repositioning of the patient?s drainage catheter into the left paracolic collection as described.

## 2008-06-27 IMAGING — CR DG CHEST 2V
2 series · 2 of 2 positions shown · non-contrast
Comparison: 05/03/07.

CLINICAL DATA: History of preoperative evaluation for colostomy takedown.  Smoking history.  
 CHEST - 2 VIEW:

[view not recorded (1 of 2)]
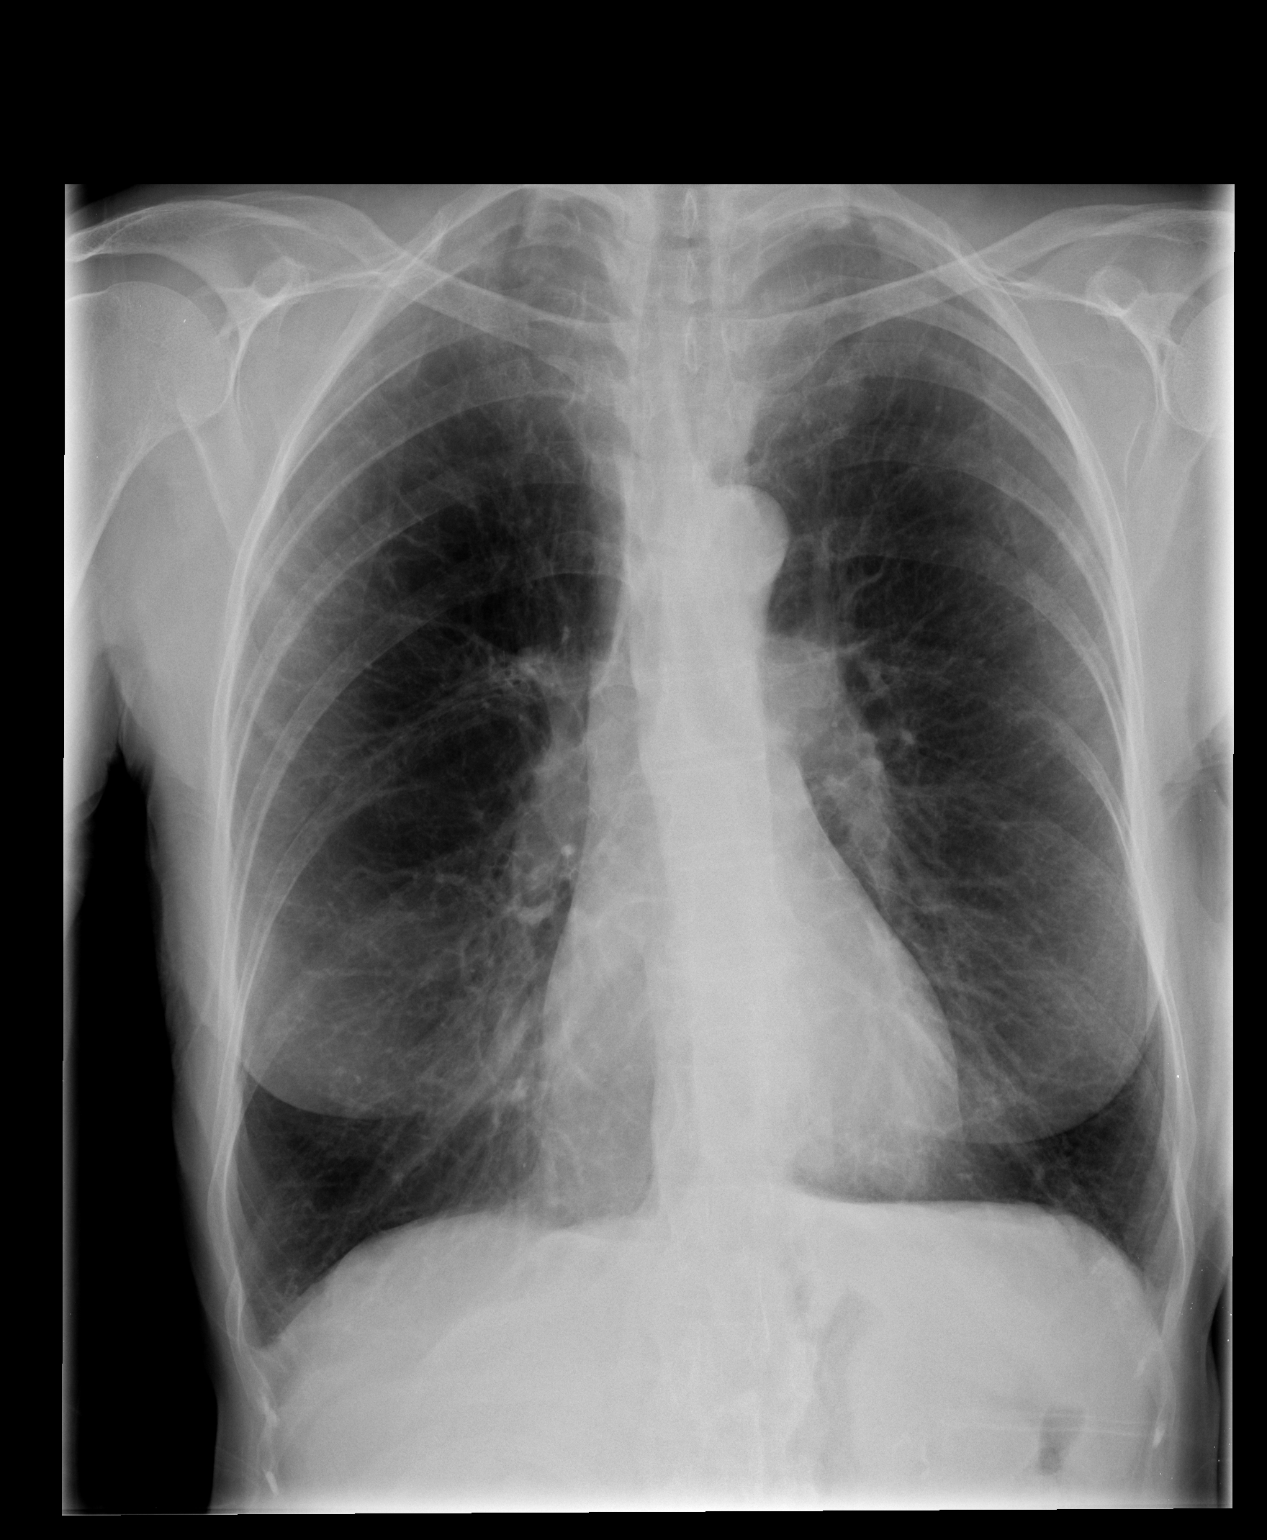

[view not recorded (2 of 2)]
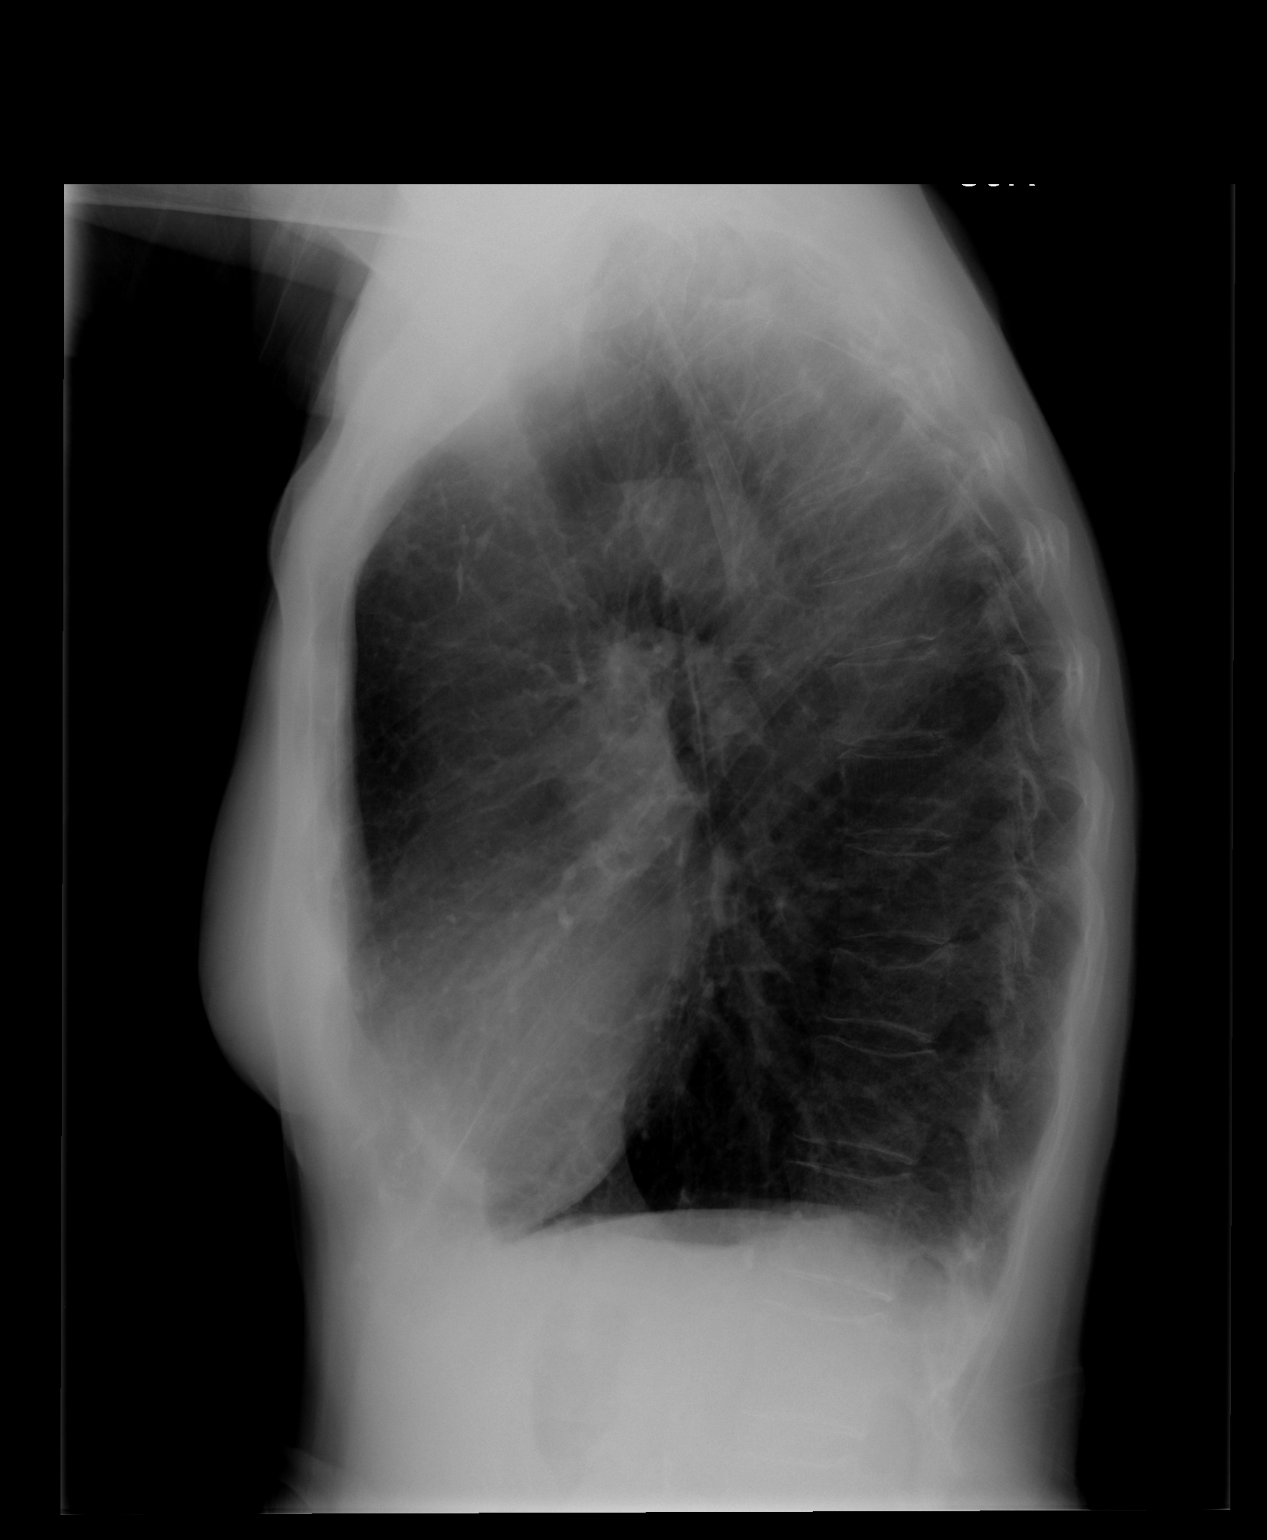

[2 of 2 positions shown; findings below may reference images not displayed]

FINDINGS: Heart size is normal.  The mediastinum is unremarkable.  The lungs are hyperinflated consistent with emphysema.  No evidence of infiltrate, mass, effusion or collapse. No significant bony finding.
IMPRESSION: Emphysema.  No active disease.

## 2008-10-13 ENCOUNTER — Encounter: Admission: RE | Admit: 2008-10-13 | Discharge: 2008-10-13 | Payer: Self-pay | Admitting: Gastroenterology

## 2011-03-13 NOTE — Op Note (Signed)
NAMEFALEN, LEHRMANN                ACCOUNT NO.:  [id number]   MEDICAL RECORD NO.:  [id number]          PATIENT TYPE:  INP   LOCATION:  5710                         FACILITY:  [facility]   PHYSICIAN:  Thornton Park. Daphine Deutscher, MD  DATE OF BIRTH:  1941/01/10   DATE OF PROCEDURE:  DATE OF DISCHARGE:                               OPERATIVE REPORT   PREOPERATIVE DIAGNOSIS:  Diverting loop colostomy in the right upper  quadrant.   POSTOPERATIVE DIAGNOSIS:  Diverting loop colostomy in the right upper  quadrant status post takedown.   PROCEDURE:  Takedown resection of a portion of colon and a  reanastomosis.   SURGEON:  Thornton Park. Daphine Deutscher, MD   ASSISTANTGeorgana Curio   ANESTHESIA:  General.   DESCRIPTION OF PROCEDURE:  Lauraann Missey was taken to room 16 given  general anesthesia.  The abdomen and stomal region were prepped with  Techni-Care and draped sterilely.  I sized a transverse ellipse, carried  this down, and found that the colon itself was stuck right under the  skin.  I was eventually able to free this up, and it kind of came up  like a chimney, and eventually had this mobilized from the fascia.  There was a common channel on either side; and what I did was like pant  legs I inserted the 55-GIA down this, and I stapled creating a  colocolostomy and increased the connection there.  That went well, and  then I went ahead and resected the anterior portion, including the  stoma.  I then closed the colon with a running 4-0 PDS in a Connell  fashion, and then imbricated that anastomosis with interrupted 3-0  silks.  A patent anastomosis was present.  No bleeding was noted.  I  then changed my gloves, and then irrigated well, and closed the fascia  with interrupted #1 Novofils and staples.  The patient tolerated the  procedure well, and was taken to recovery room in satisfactory  condition.Thornton Park Daphine Deutscher, MD  Electronically Signed     MBM/MEDQ  D:  09/02/2007  T:  09/03/2007   Job:  [id number]

## 2011-03-13 NOTE — Discharge Summary (Signed)
NAMESURENA, WELGE                ACCOUNT NO.:  [id number]   MEDICAL RECORD NO.:  [id number]          PATIENT TYPE:  INP   LOCATION:  5710                         FACILITY:  [facility]   PHYSICIAN:  Thornton Park. Daphine Deutscher, MD  DATE OF BIRTH:  1941/05/29   DATE OF ADMISSION:  09/02/2007  DATE OF DISCHARGE:  09/07/2007                               DISCHARGE SUMMARY   ADMITTING DIAGNOSIS:  Loop colostomy from previous leak after low  anterior resection.   POSTOPERATIVE DIAGNOSIS:  Loop colostomy from previous leak after low  anterior resection.   PROCEDURE:  Takedown of loop colostomy on September 02, 2007.   COURSE IN THE HOSPITAL:  Ms. [name] is a 70 year old lady who  underwent the above-mentioned operation.  She recovered slowly but began  taking liquids and passing bowel movements and was ready for discharge  on postop day 5.  Tolerating diet and was instructed to return to the  office in 4-5 days for staple removal.      Molli Hazard B. Daphine Deutscher, MD  Electronically Signed     MBM/MEDQ  D:  10/01/2007  T:  10/01/2007  Job:  [id number]

## 2011-03-13 NOTE — Op Note (Signed)
[name], SARSFIELD                ACCOUNT NO.:  [id number]   MEDICAL RECORD NO.:  [id number]          PATIENT TYPE:  INP   LOCATION:  0001                         FACILITY:  [facility]   PHYSICIAN:  Thornton Park. Daphine Deutscher, MD  DATE OF BIRTH:  [birth year]   DATE OF PROCEDURE:  04/01/2007  DATE OF DISCHARGE:                               OPERATIVE REPORT   PREOPERATIVE DIAGNOSIS:  Rectal cancer, status post chemoradiation.   POSTOPERATIVE DIAGNOSIS:  Marked response of tumor from chemoradiation,  status post   OPERATION/PROCEDURE:  Laparoscopically assisted low anterior resection  of the rectum with the 25 Ethicon EEA staple anastomosis.   SURGEON:  Thornton Park. Daphine Deutscher, M.D.   ASSISTANT:  Rose Phi. Maple Hudson, M.D.   ANESTHESIA:  General endotracheal anesthesia.   DESCRIPTION OF PROCEDURE:  Mrs. Bouie was taken back to room #1 and  given general anesthesia.  She was placed up in the yellow fin stirrups  and rectal exam was first performed.  It was felt that the tumor was  probably around 8-10 cm.  The area was then prepped with Betadine from  below and then her abdomen and perineum was prepped with a 4-0 with  Techni-Care.  I entered the abdomen through the left upper quadrant with  OptiVu 5 mm inflating.  Angle scope was used and began going into her  pelvis.  She had a big omental cake that was stuck down in the pelvis  and where she had had irradiation, there was a lot of oozing generated  just with my gentle dissection.  Also from her previous hysterectomy, I  was unable to really get things mobilized, so I went ahead and made a  lower midline incision.  With that I was able to then free the sigmoid  colon, go down and divided it with a Contour stapler.  Used a harmonic  and then the LigaSure to divide the mesentery and go down into the  pelvis.  The lateral stalks were taken with the LigaSure and I  eventually felt that I was down below it adequately and would have an  adequate margin.  I  then inserted the Contour below and stapled.  I  opened the specimen on the table and felt that I had about 2-3 cm  margin.  I sent this over fresh for Dr. Charlott Rakes to examine.   I then created the anvil using the pursestring proximally, first  clearing off with the stapled stump and then sutured with 2-0 silk in a  horizontal fashion and tying it down around the anvil.  Then went in  from below with the Stealth Ethicon EEA 25 and even a  25 mm was  difficult to through her very tight little anus.  This was inserted and  I palpated as I inserted it, and prior to closing it, I put my finger in  the vagina to make sure I was not involving the vagina at all and I did  not appear to be.  The stapling device was closed and fired and  withdrawn.  I then inserted the scope, inflated  the anastomosis and no  bubbles were seen.  Also the staple line appeared to be intact.  Again I  flushed the area with Betadine and left it.   I went from above, looked at again and no bleeding was noted in the  pelvis. The abdominal organs returned to their anatomic position. All  the sponges I used for packing were accounted and were out of the  abdomen.  The wound  was closed in layers with  three interrupted 0 Novofils, with 0 Vicryl  on the peritoneum with a running #1 PDS from above and below.  Wound was  then closed with staples.  The other little trocar sites were closed  staples.  The patient seemed to tolerate the procedure well and was  taken to recovery room in satisfactory condition.      Thornton Park Daphine Deutscher, MD  Electronically Signed     MBM/MEDQ  D:  04/01/2007  T:  04/01/2007  Job:  [id number]

## 2011-03-13 NOTE — Discharge Summary (Signed)
[name], [name]                ACCOUNT NO.:  [id number]   MEDICAL RECORD NO.:  [id number]          PATIENT TYPE:  INP   LOCATION:  5036                         FACILITY:  [facility]   PHYSICIAN:  [name], M.D.  DATE OF BIRTH:  01-Oct-1941   DATE OF ADMISSION:  12/23/2006  DATE OF DISCHARGE:  01/15/2007                               DISCHARGE SUMMARY   DISCHARGE MEDICATIONS:  1. Albuterol 2 puffs q.4 hours p.r.n.  2. Advair 1 puff b.i.d.  3. Metoprolol 50 mg b.i.d.  4. Spiriva 18 mcg once daily.  5. Ferrous sulfate 325 mg t.i.d.  6. Tylenol 650 mg q.6 hours p.r.n.   DISCHARGE DIAGNOSES:  1. Rectal adenocarcinoma.  2. Right lower lobe pneumonia.  3. Urinary tract infection.  4. Severe protein malnutrition.  5. Hypomagnesemia, resolved.  6. Hypokalemia, resolved.  7. Chronic obstructive pulmonary disease with possible cor pulmonale.  8. Hypertension.  9. Congestive heart failure secondary to diastolic dysfunction.  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   CONSULTATIONS:  1. [facility] Cardiology, Dr. Corliss Marcus.  2. [facility] Surgery, Dr. Avel Peace.  3. Hematology/oncology, Dr. Pierce Crane.  4. GI, Dr. Jeani Hawking.   PROCEDURES:  1. Chest x-ray February 25 demonstrated a right lower lobe pneumonia      with small effusion.  2. EGD on December 24, 2006 showed multiple AVMs in the gastric lumen.  3. A 2D echo  on February 26 demonstrated estimated ejection fraction      of 65-75%, with mild to moderately increased left ventricular wall      thickness.  4. CT of the abdomen on February 27 showed bilateral pleural effusions      with basilar atelectasis, more dense opacity in the right lung base      likely due to pneumonia, and anasarca with subcutaneous edema,      ascites, and mesenteric edema.  5. CT of the pelvis on February 27 showed findings highly suspicious      for a rectosigmoid mass and anasarca.  6. Colonoscopy by Dr. Anselmo Rod on  December 25, 2006 showed a      large lobulated mass in the rectosigmoid colon.  Biopsy      demonstrated invasive colonic adenocarcinoma.  7. Ultrasound guided thoracentesis on December 27, 2006 yielded 280 cc      of fluid.  8. Myoview on January 06, 2007 demonstrated no stress induced ischemia      and an ejection fraction of 75%.  9. Pulmonary function tests on March 10 showed severe obstructive      airways disease.  10.CT scan of the chest on January 09, 2007 showed COPD and emphysema; a      residual spiculated masslike density in the right lower lobe      measuring 6 X 15 mm; this abuts the major fissure and may represent      resolving pneumonia, however neoplasm could also be present; follow-      up CT in 3 months suggested.  11.NM PET imaging on January 14, 2007 showed the right lower lobe  nodular density persists but is not hypermetabolic, this most      likely represents an area of scarring, follow-up CT in 3 to 6      months is recommended to confirm stability; hypermetabolism likely      corresponds to the patient's known rectosigmoid mass; resolution of      ascites with findings suspicious for cirrhosis; cannot exclude      early/subtle carcinomatosis with small nodule in the gastrocolic      ligament; activity along the central aspect of right-sided PICC,      correlate with whether patient could have infection or thrombus.   HISTORY AND PHYSICAL:  This is a 70 year old woman who had a chief  complaint of progressive fatigue, which was progressive over several  months, which started after November of 2007.  She also complained of  shortness of breath with cough with anorexia and weight loss.  She had  no significant past medical history and was taking no medications at the  time.   HOSPITAL COURSE:  1. Rectal adenocarcinoma.  As above, colonoscopy demonstrated a      rectosigmoid mass, and biopsy showed invasive colonic      adenocarcinoma.  A surgical consult was  obtained from Dr.      Abbey Chatters, who recommended waiting on surgery until the pneumonia      was treated and until she had received aggressive nutritional      support for her severe malnutrition.  She was supported with TNA      through March 19.  On reassessment, given the location of the      cancer at less than 5 cm from the anus, surgery recommended      preoperative chemotherapy and radiation therapy.  Oncology      consultation was obtained from Dr. Pierce Crane, and radiation      oncology consult was obtained.  Plans were made for outpatient      chemotherapy and radiation therapy prior to surgery.  As part of      her preoperative assessment, cardiology consult was obtained from      Dr. Amil Amen; he recommended a Myoview study and pulmonary function      tests.  The Myoview showed no inducible ischemia; the PFTs showed      severe obstructive lung disease.  2. Anemia.  At admission, patient's hemoglobin was 4.4.  She was      transfused 7 units of PRBCs, which raised her hemoglobin to 11.8.      This initiated a search for the cause of her severe microcytic      anemia.  EGD on December 24, 2006 showed multiple AVMs in the      gastric lumen.  Patient's hemoglobin gradually declined to 8.0 on      March 20, so two additional units of PRBCs were transfused, and her      hemoglobin improved to 12.0 on the day of discharge.  3. Pneumonia.  At first, she was treated with Rocephin and      azithromycin.  An ultrasound guided thoracentesis was done to      evaluate her pleural effusion; results were transudative, and gram      stain and cultures were negative.  She was then switched to Avelox      to complete treatment for community acquired pneumonia.  4. UTI.  Urinalysis showed Trichomonas and she was treated with      Flagyl.  5. Severe protein  malnutrition.  While she was in the hospital, she      was given TNA and when she was able to take p.o. began a regular      diet.  6.  Hypomagnesemia.  This was repleted and resolved.  7. Hypokalemia.  This was treated and resolved.  8. COPD.  We instituted a pulmonary toilet with albuterol, Advair and      Spiriva.  9. Hypertension.  This was well controlled while she was in the      hospital.  10.CHF.  This was treated with diuresis.  She responded very well to      this.   DISCHARGE:  Patient was discharged to home with outpatient follow-up  arranged for radiation therapy and chemotherapy.  Subsequent to this,  she will then go to surgery.      Hollace Hayward, M.D.  Electronically Signed      [name], M.D.  Electronically Signed    TE/MEDQ  D:  04/03/2007  T:  04/03/2007  Job:  [id number]

## 2011-03-13 NOTE — Discharge Summary (Signed)
NAMENATIYA, SEELINGER                ACCOUNT NO.:  [id number]   MEDICAL RECORD NO.:  [id number]          PATIENT TYPE:  INP   LOCATION:  5704                         FACILITY:  [facility]   PHYSICIAN:  Thornton Park. Daphine Deutscher, MD  DATE OF BIRTH:  [birth year]   DATE OF ADMISSION:  04/21/2007  DATE OF DISCHARGE:  05/26/2007                               DISCHARGE SUMMARY   STAT DISCHARGE SUMMARY FOR NURSING HOME PLACEMENT.   ORIGINAL DATE OF ADMISSION:  April 01, 2007.   Ms. Benefiel was initially admitted to [facility] on April 01, 2007, and was transferred to [facility] on April 21, 2007, and has  stayed at [facility] the remaining portion of her hospitalization.   ADMITTING DIAGNOSES:  1. Rectal cancer status post neoadjuvant chemoradiation.  2. Chronic obstructive pulmonary disease.   DISCHARGE DIAGNOSES:  Colon cancer status post low anterior resection  with a leak and drain in a loop colostomy and significant  deconditioning.   COURSE IN THE HOSPITAL:  Etna Forquer is a 70 year old lady who was  admitted on June 3rd and underwent a low anterior resection for a very  low colorectal cancer that had been treated with chemoradiation.  Preoperatively it was noted she had significant COPD and was very  borderline in terms of her functional status and had some weakness and  inanition.  Surgery, however, went well and initially she did well  postoperatively but then at about 10 days she developed an ileus,  distention, fever and on CT scan was found to have a fluid collection  which was percutaneously drained but subsequently required a proximal  loop colostomy.  For that she was transferred to [facility] on June 23rd and  underwent a loop colostomy over a bridge in her right upper quadrant on  June 24th.  At that time, and based on the CT scans, we realized that  Ms. Seawright also has some problems with hepatic cirrhosis and has some  ascites.  That is also on top of her significant COPD.   Postoperatively,  she had some problems, developing a really marked inflammatory reaction  and rash across her abdomen and going around to her backside.  She was  seen by Dr. Amy Swaziland, dermatologist, who prescribed some topical  creams, including triamcinolone, and she would receive some steroids as  well and this seemed to pretty much resolve.   Her colostomy functioned and once the bridge was removed she got a much  better seal and had fewer issues with leakage.  Her main problems prior  to discharge had been her weakness and then minimal exercise tolerance  and her poor p.o. intake.  She was tried on some Megace just a couple of  days prior to discharge to see if that will stimulate her appetite.   DISCHARGE MEDICATIONS:  Will include:  1. Clonidine 0.1 mg p.o. b.i.d.  2. Potassium chloride 20 mEq b.i.d.  3. Lopressor 37.5 mg p.o. b.i.d.  4. Protonix 40 mg p.o. daily.  5. Eucerin cream applied to a rashed area daily.  6. Lasix 450 mg p.o. daily.  7. Ferrous sulfate.  8. Vitamin C as needed.  9. Monistat 2% cream to her skin, the dermatologic type, t.i.d.  10.Aranesp subcu, given once a week on Friday.  11.Triamcinolone acetonide, dermatologic topical, 0.1% b.i.d.  12.In addition, she has albuterol inhalers and Atrovent inhalers which      she usually takes t.i.d.   The patient will be discharged to [facility] where she  should stay on a diet ad lib and be followed for calorie counts  possibly, to ensure that she has adequate p.o. intake.   FINAL DIAGNOSIS:  Colorectal cancer of the rectum status post  neoadjuvant chemotherapy, low anterior resection showing no negative  disease but completed by a leak in the area and poor healing, requiring  loop colostomy.  Subsequently, she has had some significant  deconditioning and requires skilled nursing home placement.      Thornton Park Daphine Deutscher, MD  Electronically Signed     MBM/MEDQ  D:  05/26/2007  T:  05/26/2007   Job:  [id number]

## 2011-03-13 NOTE — Op Note (Signed)
NAMEAILINE, HEFFERAN                ACCOUNT NO.:  [id number]   MEDICAL RECORD NO.:  [id number]          PATIENT TYPE:  INP   LOCATION:  5702                         FACILITY:  [facility]   PHYSICIAN:  Thornton Park. Daphine Deutscher, MD  DATE OF BIRTH:  April 05, 1941   DATE OF PROCEDURE:  04/22/2007  DATE OF DISCHARGE:                               OPERATIVE REPORT   Date of Surgery: 04/22/07   PREOPERATIVE DIAGNOSIS:  Dehisced colorectal anastomosis with pelvic  abscess status post drainage.   PROCEDURE:  Loop colostomy for diversion and Tisseel application to the  area of leakage.   SURGEON:  Thornton Park. Daphine Deutscher, MD   ANESTHESIA:  General.   DESCRIPTION OF PROCEDURE:  Ms. Chuong was taken to room 17 on 6/24 given  general anesthesia.  The abdomen was prepped with Techni-Care and draped  sterilely.  She was up in yellow fin stirrups.  A small transverse  incision was made in the right upper quadrant and I went in and found a  liter of golden colored ascites which I sent for cytology.  I was also  able to find the liver edge and I cut out or removed a segment of liver  about a centimeter in depth and sent that for permanent sections.  Edges  of the liver were cauterized with electrocautery.  The colon and its  mesentery was stuck down to the previous midline incision.  I gently  took those down very carefully and slowly was able to mobilize the  transverse colon, getting it up in the wound putting it over a bridge.  After I did that I went down below and scoped the patient and found a  dehiscence posterolaterally on the left about 25% anastomosis where the  staple line had failed.  I was able to visualize the drain through that  and I went ahead and filled that area with Tisseel.  Likewise I went  proximally and then I opened the ostomy after filling the areas around  this with Tisseel was well and matured the colostomy, placed a bag.  The  patient was taken to recovery room in satisfactory condition  to  hopefully return to her room on 5700.      Thornton Park Daphine Deutscher, MD  Electronically Signed     MBM/MEDQ  D:  04/22/2007  T:  04/23/2007  Job:  [id number]

## 2011-03-16 NOTE — Consult Note (Signed)
[name], [name]                ACCOUNT NO.:  [id number]   MEDICAL RECORD NO.:  [id number]          PATIENT TYPE:  INP   LOCATION:  2102                         FACILITY:  [facility]   PHYSICIAN:  Jordan Hawks. Elnoria Howard, MD    DATE OF BIRTH:  1941/09/28   DATE OF CONSULTATION:  12/24/2006  DATE OF DISCHARGE:                                 CONSULTATION   REASON FOR CONSULTATION:  Anemia and heme-positive stool.   REFERRING SERVICE:  The teaching service.   HISTORY OF PRESENT ILLNESS:  This is a 70 year old female with a past  medical history of a hysterectomy and left hip replacement who was  admitted to the hospital with complaints of fatigue that has been  progressive and patient states that her symptoms have been ongoing for  several months and started sometime in November.  At the insistence of  her family and friends she was told to come to the hospital as she was  not getting any better.  Her generalized weakness continued to worsen  and she also complains of having a cough, there is some shortness of  breath.  The patient has a long history of tobacco use.  Upon arrival to  the emergency room, the patient was noted to have a hemoglobin of 4.4  with an MCV of 56.  Subsequently she was asked about her GI symptoms and  she states having some intermittent bright red blood per rectum but she  did not feel that this was of any great significance.  She did not have  a significant amount of bleeding.  She denies any history of melena.  No  prior history of colonoscopy and she denies taking any over-the-counter  medications such as ibuprofen, aspirin, Goody powder, BC powder, etc.  Because of the finding of her significant anemia and also the heme-  positive stools, GI consultation was requested.   ALLERGIES:  NO KNOWN DRUG ALLERGIES.   PAST MEDICAL HISTORY:  As stated above.   PAST SURGICAL HISTORY:  As stated above.   MEDICATION AT HOME:  Tylenol p.r.n.   SOCIAL HISTORY:  She is  widowed, lives with her son.  She has a half  pack per day to one pack per day smoking history for the past 50 years.  No alcohol or illicit drug use.   FAMILY HISTORY:  Positive for lung cancer, myocardial infarction,  hypertension.   REVIEW OF SYSTEMS:  As stated above in the history of present illness  and negative for any new neurologic changes, skin rashes, polyuria,  polydipsia, abdominal pain, fevers or chills.   PHYSICAL EXAMINATION ON ADMISSION:  VITAL SIGNS:  Blood pressure is  139/57, heart rate is 94, temperature is 100.7.  GENERAL APPEARANCE:  The patient appears older than stated age, but is  alert and oriented.  HEENT:  Normocephalic, atraumatic.  Extraocular muscles intact.  Pupils  equal, round, reactive to light.  NECK:  Supple.  No lymphadenopathy.  LUNGS:  Some crackles in the bilateral bases.  CARDIOVASCULAR:  Regular rate and rhythm.  ABDOMEN:  Flat, soft, nontender, nondistended.  Positive  bowel sounds,  seem positive.  EXTREMITIES:  No clubbing, cyanosis or edema.   LABORATORY VALUES:  Currently white blood cell count 12.3, hemoglobin  11.8, platelets at 290.  AST is 21, ALT 11, alk phos 131.  Total  bilirubin 0.7, albumin is 2.2.   IMPRESSION:  1. Severe iron-deficiency anemia.  2. Heme-positive stool.  3. Pneumonia.   After evaluation of the patient, a EGD is required at this time.  She  has received an appropriate mount of blood transfusions and has  increased appropriately.  If the EGD is negative, a colonoscopy will be  scheduled for further evaluation.      Jordan Hawks Elnoria Howard, MD  Electronically Signed     PDH/MEDQ  D:  12/24/2006  T:  12/24/2006  Job:  [id number]

## 2011-03-16 NOTE — Consult Note (Signed)
NAMEALLEN, BASISTA NO.:  [id number]   MEDICAL RECORD NO.:  [id number]          PATIENT TYPE:  INP   LOCATION:  5036                         FACILITY:  [facility]   PHYSICIAN:  Pierce Crane, M.D.      DATE OF BIRTH:  1940/11/10   DATE OF CONSULTATION:  01/09/2007  DATE OF DISCHARGE:                                 CONSULTATION   REFERRING PHYSICIAN:  Dr. Kathrynn Running.   REASON FOR CONSULT:  Rectal cancer.   HISTORY OF PRESENT ILLNESS:  Ms. Hoiland is a 70 year old female we are  asked to see for evaluation of rectal cancer.  In review, she was  admitted on December 25, 2006 with cough, generalized weakness and  weight loss, found to have a right lower lobe pneumonia and severe  anemia, with a hemoglobin of 4.4 and Hemoccult-positive.  Colonoscopy on  December 25, 2006 revealed rectosigmoid colon mass, which per biopsy was  positive for invasive adenocarcinoma of the colon.  CT of the abdomen  showed no obvious hepatic or other abdominal metastasis, but the omentum  appeared thickened and implants could not be excluded.  CT of the  abdomen also showed bilateral pleural effusions, which per paracentesis  was negative.  CT of the pelvis showed a 3.9 x 4.7 x 6-cm colonic mass,  extensive subcutaneous edema, no free fluid and no focal bony metastases  or lymphadenopathy.  Due to her overall status, surgery was postponed  and then cancelled for now.  We were asked to see the patient,  anticipating that she will need followup based on this new diagnosis and  at this time, Radiation Oncology is to see the patient with  recommendations, along with our suggestions regarding her disease.   PAST MEDICAL HISTORY:  1. CHF.  2. COPD.  3. History of tobacco abuse.  4. 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  5. Pneumonia on admission.  6. Severe protein malnutrition.  7. Anemia, acute on chronic/iron deficiency.  8. Cor pulmonale possibly secondary to COPD.  9.  Hypertension.  10.Hard of hearing.  11.Trichomonas in UA during this admission.   SURGERIES:  1. Status post hysterectomy/oophorectomy in the 1970s.  2. Status post left hip replacement.   ALLERGIES:  No known drug allergies.   CURRENT MEDICATIONS:  1. Ventolin nebulizers t.i.d.  2. TNA at 55 mL an hour.  3. Heparin per pharmacy.  4. NovoLog.  5. Megace.  6. Lopressor.  7. Protonix.  8. Spiriva.  9. Delsym.  10.Ferrous sulfate.  11.Mefoxin as directed.   REVIEW OF SYSTEMS:  Current review of systems remarkable for 3- to 6-  month history of fatigue, weight loss and anorexia.  The patient cannot  state how much weight she has lost.  She also has dyspnea on exertion.  No cardiac or other GI complaints.  No dysuria at this time or gross  hematuria.  No musculoskeletal pain.  No dysesthesias.  The rest of the  review of systems is negative.   FAMILY HISTORY:  Mother died at 26 of lung cancer.  Father died at  85  with MI.  She has 8 siblings; 1 died with what she calls female cancer;  she is not aware of any other cancer in the family in regard to the  other siblings.   SOCIAL HISTORY:  The patient is widowed.  She has 3 children, 2 in good  health and 1 mentally impaired.  She is retired.  No alcohol history.  She smokes 1-2 packs a day of tobacco for at least 50 years.  She lives  in [place].   PHYSICAL EXAMINATION:  GENERAL:  This is an ill-appearing, thin 70-year-  old white female in no acute distress, alert and oriented x3, who looks  much older than her stated age.  VITAL SIGNS:  Blood pressure 121/56, pulse 72, respirations 20 and  temperature 98.6.  Pulse oximetry 98% on 1 L.  Weight 40.3 kg, height 5  feet 1 inch.  HEENT:  Edentulous.  Normocephalic.  PERRLA.  Oral mucosa without thrush  or lesions.  NECK:  Supple.  No cervical or supraclavicular masses.  LUNGS:  Minimal wheezing.  No rhonchi or rales.  BREASTS:  No axillary masses.  Breasts without masses.   CARDIOVASCULAR:  Regular rate and rhythm without murmurs, rubs, or  gallops.  ABDOMEN:  Soft and nontender.  Bowel sounds x4.  No palpable spleen or  liver.  GU AND RECTAL:  Deferred.  EXTREMITIES:.  No clubbing or cyanosis.  No edema.  SKIN:  Without lesions, bruising or petechiae.  NEUROLOGIC:  Nonfocal.   LABORATORY DATA:  Hemoglobin 9.1, hematocrit 28.2, white count 14.8,  platelets 277,000, neutrophils 13.7, MCV 78.8 and was 56 on admission.  PT 13.8, PTT 28, INR 1.0.  TSH 0.576.  Sodium 137, potassium 4.6, BUN  34, creatinine 0.7, glucose 93, total bilirubin 0.7, alkaline  phosphatase 179, AST 39, ALT 39, total protein 5.4, albumin 2.6, calcium  8.8, magnesium 2.1.  CEA was 1.6 on December 26, 2004.  LDH in fluid was  116.   ASSESSMENT AND PLAN:  Dr. Donnie Coffin has seen and evaluated the patient and  the chart has been reviewed.  This is a 70 year old female who presents  with locally advanced rectal carcinoma, with severe anemia and protein  calorie malnutrition.  She also has other medical conditions including  chronic obstructive pulmonary disease and diastolic dysfunction.  Staging scans showed no obvious mass, except for a questionable lung  nodule and possible omental disease.  Surgery has recommended  preoperative chemotherapy and radiation, given the location of the  tumor.  Thus, the recommendations include CT for staging, given the lung  lesion.  The patient has a poor overall physical condition regarding  potential chemotherapy and radiation therapy, which may be difficult.  She may benefit from initial radiation therapy and perhaps a low dose of  chemotherapy, such as 5-FU given after 1-2 weeks.  I would see how she  tolerates.  Doubt that she would tolerate any aggressive chemotherapy  program.  Another consideration would be to keep this patient as inpatient while she is on radiation therapy to monitor for side-effects  and perhaps  the patient would benefit from  assigned primary care or hospitalist to  follow while the patient is in-house during her radiation therapy.   Thank you very much for allowing [place] the opportunity to participate in  the care of Ms. Ladona Ridgel.      Marlowe Kays, P.A.      Pierce Crane, M.D.  Electronically Signed    SW/MEDQ  D:  01/12/2007  T:  01/13/2007  Job:  [id number]   cc:   Anselmo Rod, M.D.  [facility]  Dr. Bascom Levels

## 2011-03-16 NOTE — Op Note (Signed)
[name], [name]                ACCOUNT NO.:  [id number]   MEDICAL RECORD NO.:  [id number]          PATIENT TYPE:  INP   LOCATION:  4734                         FACILITY:  [facility]   PHYSICIAN:  Anselmo Rod, M.D.  DATE OF BIRTH:  Mar 04, 1941   DATE OF PROCEDURE:  12/23/2006  DATE OF DISCHARGE:                               OPERATIVE REPORT   PROCEDURE PERFORMED:  Colonoscopy with multiple cold biopsies.   ENDOSCOPIST:  Anselmo Rod, M.D.   INSTRUMENT USED:  Pentax video colonoscope.   INDICATIONS FOR PROCEDURE:  A 70 year old white female with a history of  severe iron-deficiency anemia undergoing a colonoscopy to rule out  colonic polyps, masses, etc.  Patient had some fresh bleeding on anal  inspection and was found to have a hemoglobin of 4.4 gm/dl on admission  to the hospital.   PRE-PROCEDURE PREPARATION:  Informed consent was procured from the  patient.  The patient was fasted for eight hours prior to the procedure  and prepped with a gallon of GoLYTELY the night prior to the procedure.  Risks and benefits of the procedure, including a 10% missed rate of  cancer and polyps, were discussed with the patient as well.   PRE-PROCEDURE PHYSICAL EXAMINATION:  VITAL SIGNS:  Stable vital signs.  NECK:  Supple.  CHEST:  Decreased breath sounds bilaterally.  Marland Kitchen  HEART:  S1 and S2.  Regular.  ABDOMEN:  Soft with normal bowel sounds.   DESCRIPTION OF PROCEDURE:  The patient was placed in the left lateral  decubitus position and sedated with 50 mcg of fentanyl and 5 mg of  Versed given intravenously in slow, incremental doses.  Once the patient  was adequately sedated and maintained on low flow oxygen and continuous  cardiac monitoring, the Pentax video colonoscope was advanced from the  rectum to the cecum.  There was a large amount of residual stool in  dependent areas of the colon.  A large lobulated mass was noted at 10 cm  and extended for about 6 cm proximally.  This was  biopsied for  pathology.  Multiple washes were done.  Small lesions could be missed.  The appendicle orifices and ileocecal valve were clearly visualized and  photographed.  Small internal hemorrhoids were seen on retroflexion in  the rectum.   IMPRESSION:  1. A large lobulated mass in the rectosigmoid colon.  2. Internal hemorrhoids seen on retroflexion.  3. Large amount of residual stool in the colon.  Small lesions could      be missed.   RECOMMENDATIONS:  1. Await pathology results.  2. A CT scan of the abdomen and pelvis has been advised.  This has      been discussed with Dr. Landis Martins on the teaching service.  3. Check CEA levels with the next blood draw.  4. Surgical evaluation has been requested by Dr. Abbey Chatters, who is      aware of the patient's condition.  5. Further recommendation to be made in followup.      Anselmo Rod, M.D.  Electronically Signed     JNM/MEDQ  D:  12/25/2006  T:  12/25/2006  Job:  [id number]   cc:   Duncan Dull, M.D.  Adolph Pollack, M.D.

## 2011-03-16 NOTE — Consult Note (Signed)
[name], MARCHETTI                ACCOUNT NO.:  [id number]   MEDICAL RECORD NO.:  [id number]          PATIENT TYPE:  INP   LOCATION:  4734                         FACILITY:  [facility]   PHYSICIAN:  Adolph Pollack, M.D.DATE OF BIRTH:  [birth year]   DATE OF CONSULTATION:  12/25/2006  DATE OF DISCHARGE:                                 CONSULTATION   REQUESTING PHYSICIAN:  Anselmo Rod, M.D.   REASON:  Colonic mass.   HISTORY OF PRESENT ILLNESS:  This is a 70 year old female who according  to her sister has had significant weight loss and increasing fatigue and  weakness for the past 3-6 months; and has refused to seek medical  attention.  Recently, however, she was so weak that she was admitted to  the hospital 2 days ago.  At that time, she was found to have a  significant anemia with a hemoglobin of 4.4 as well as a right lower  lobe pneumonia.  She had been coughing quite a bit.  She was also found  to have Hemoccult-positive stool.  She underwent a colonoscopy, today,  which demonstrated a rectosigmoid mass suspicious for malignancy.  Biopsies were done.  A CT scan and a CEA has been ordered.  I  subsequently was asked to see her.  Currently, she has returned from  endoscopy, slightly sedated, but does answer some questions  appropriately.   PAST MEDICAL HISTORY:  1. COPD.  2. Heavy history of tobacco use.  3. Degenerative joint disease.   PREVIOUS OPERATIONS:  1. Hysterectomy.  2. Left hip replacement.   ALLERGIES:  None known.   CURRENT MEDICATIONS INCLUDE:  Protonix, Lasix, ceftriaxone, Zithromax,  p.r.n. medicines.   SOCIAL HISTORY:  She has a heavy history of tobacco use and has been  smoking for approximately 50 years.  Denies alcohol use.   FAMILY HISTORY:  Notable for some lung cancer as well as hypertension,  and possible GYN cancer.   REVIEW OF SYSTEMS:  It is difficult to obtain in her current state, but  per the health history section, there was no  history of hypertension or  heart disease.  PULMONARY:  Has the COPD and has been coughing.  NEUROLOGIC:  No history of strokes or seizures.  HEMATOLOGIC:  No known  bleeding disorders.  ENDOCRINE:  No diabetes.  GI:  Has had some bright  red blood per rectum recently.   PHYSICAL EXAM:  GENERAL:  A thin cachectic female who is slightly  sedated, but answers most questions appropriately.  EYES:  Extraocular motions intact.  No icterus.  NECK:  Supple with significant muscular wasting.  No obvious masses.  RESPIRATORY:  The breath sounds are distant, but clear.  CARDIOVASCULAR:  Regular rate, regular rhythm.  ABDOMEN:  Soft with a lower midline scar.  There is no tenderness,  palpable masses, or organomegaly.  EXTREMITIES :  There is a significant muscular wasting.  SKIN:  No jaundice.   LABORATORY DATA:  Notable for A hemoglobin of 11.6 post transfusion.  White cell count is 11,900.  Albumin is 2.2.  Alkaline phosphatase is  131.   CHEST X-RAY:  She demonstrates a right lower lobe infiltrate.   IMPRESSION:  1. Colonic neoplasm with weight loss, anemia, severe malnutrition very      suspicious for a malignancy, it may be widespread intra-      abdominally.  2. Pneumonia.  It is currently active and is being treated.  3. Severe deconditioned state.   PLAN:  I agree with the CT scan and a CEA level for metastatic workup.  Would check a prealbumin.  She would try to clear the pneumonia  medically; and give her 7 days of aggressive nutritional support.  Following this; and it there is no wide spread disease, I would suggest  a partial colectomy.      Adolph Pollack, M.D.  Electronically Signed     TJR/MEDQ  D:  12/25/2006  T:  12/25/2006  Job:  [id number]   cc:   Anselmo Rod, M.D.  Duncan Dull, M.D.

## 2011-03-16 NOTE — Consult Note (Signed)
[name], [name]                ACCOUNT NO.:  [id number]   MEDICAL RECORD NO.:  [id number]          PATIENT TYPE:  INP   LOCATION:  5036                         FACILITY:  [facility]   PHYSICIAN:  Francisca December, M.D.  DATE OF BIRTH:  1941-10-16   DATE OF CONSULTATION:  01/05/2007  DATE OF DISCHARGE:                                 CONSULTATION   REASON FOR CONSULTATION:  Abnormal echo, preoperatively evaluation.   HISTORY OF PRESENT ILLNESS:  [name] is an unfortunate 70 year old  woman who was admitted to [facility] on December 23, 2006 with severe  failure to thrive, fatigue, shortness of breath, near-syncope, and  pneumonia, and a hemoglobin of 4.4.  She was at a febrile state and  leukocytosis.  Since that time she had been treated for community-  acquired pneumonia and received 4 units of PRBC with a current  hemoglobin 9.6.  A GI evaluation for her iron deficiency anemia is  revealed adenocarcinoma of the rectum.   Because of lower extremity edema and dyspnea on exertion on presentation  the 2-D echocardiogram was obtained.  This showed normal LV systolic  function EF 65%.  Not able to identify regional wall motion  abnormalities but not an adequate study.  She also had right ventricular  and right atrial enlargement.  There is no significant TR.  RV and PA  systolic pressure could not be estimated.  Because of this abnormal  finding a cardiology consultation is requested.   The patient relates a previous history of dyspnea over the last 3-4  months.  This is while the rest of her illness was worsening.  Prior to  that she had been fairly functional and only limited with the dyspnea on  marked exertion.  She denies any history of chest discomfort.   She is a current smoker 1/2 to 1 pack of cigarettes a day for 50 years.   PAST MEDICAL HISTORY:  History of left hip replacement and hysterectomy  35-36 years ago including oophorectomy.   DRUG ALLERGIES:  None  known.   CURRENT MEDICATIONS:  In the hospital:  1. TNA at 55 mL per hour.  2. Megace 400 mg t.i.d.  3. Metoprolol 50 mg p.o. b.i.d.  4. Avelox day 5 of 7.  5. Protonix 40 mg p.o. daily.  6. Prednisone 5 mg p.o. daily.  7. Spiriva 18 mcg p.o. daily.  8. Maxzide 37.5/25 one p.o. daily.  9. Albuterol 2 puffs daily.   SOCIAL HISTORY:  Tobacco use as above.  No ethanol.  No illicit drugs.  She is widowed, lives with a mentally impaired son.   FAMILY HISTORY:  Mother died of lung cancer at age 71.  She has 8  siblings. Father died of an MI in  44s. There is one sibling with  ovarian cancer.   REVIEW OF SYSTEMS:  She had a significant weight loss prior to admission  but cannot tell me how much that was.  Admission weight was 40 kg.  She  persists in that weight now.  No diaphoresis or night sweats.  She has  had no nosebleeds, sore throat, mouth or tooth pain.  She has had  palpitations and dyspnea.  She had lower extremity edema on admission  that was significant. She had dyspnea on exertion as mentioned. She had  diarrhea and occasional hematochezia prior to admission. She had  anorexia.  She had abdominal bloating with minimal p.o. intake.  She  denies any dysuria or hematuria.  No nocturia.  No hair loss, rash or  itching.  No heat or cold intolerance.  She was very pale and yellowish  upon admission.  No joint pain, recent trauma, muscle pain or back pain.  No headache, weakness, numbness, blurred vision, dizziness or vertigo.  Has not had any mental health disorder.   PHYSICAL EXAMINATION:  VITAL SIGNS:  The blood pressure is 130/57, pulse  84 regular, respiratory rate 24, temperature 99.1, O2 saturation 96% 2  liters. I&O's are incomplete but she had an 1100 mL output yesterday.  Weight has not been changed at 88 pounds if in fact she has actually  been being weighed.  GENERAL:  She is alert, pleasant, no distress, very thin Caucasian  female.  HEENT:  Unremarkable.  Head is  atraumatic and normocephalic.  Pupils  equal and reactive to light and accommodation.  Extraocular motion  intact.  Sclerae are anicteric.  Oral mucosa is pink and moist.  Tongue  is not coated.  Neck is supple without thyromegaly or masses.  The  carotid upstrokes are normal.  There is no bruit, no jugular venous  distension.  CHEST:  Has diminished breath sounds, reduced excursion bilaterally.  No  wheezes, rales or rhonchi.  HEART:  Has regular rhythm, normal S1-S2. I do not feel any right  ventricular or left ventricular lift.  There is no murmur or rub.  No  gallop.  ABDOMEN:  Soft, scaphoid, nontender, positive bowel sounds.  No  hepatomegaly.  EXTERNAL GENITALIA:  Is atrophic.  No lesions.  RECTAL:  Not performed.  Foley catheter is in place.  EXTREMITIES:  Show full range of motion.  No edema.  Intact distal  pulses.  NEUROLOGICAL:  Cranial nerves II-XII intact.  Motor and sensory grossly  intact.  Gait not tested.  Skin is still somewhat pale.   ACCESSORY CLINICAL DATA:  Hemoglobin 9.5, hematocrit 29.7, white blood  cell count 14,600, platelet 302,000.  Serum electrolytes, BUN,  creatinine, glucose all within normal limits.  Ferritin is less than 5,  iron saturation less than 10%, albumin 2.4, calcium 9.1.   Electrocardiogram February 29 shows left atrial enlargement, small  inferior Q-waves not quite significant in nature.  There is no evidence  of right ventricular enlargement.   CHEST X-RAY:  Shows severe hyperinflation consistent COPD.  Radiologist  mentions that mild edema cannot be excluded.  There is no evidence of  right heart enlargement in fact there is no cardiac enlargement at all.   Echocardiogram as mentioned above.   ASSESSMENT:  1. RV and RA dilatation without evidence of RV or PA systolic pressure      by TR velocity.  This was not obtained or unobtainable. 2. Presumably her right heart pressure is elevated and these findings      would be consistent  with cor pulmonale secondary to severe chronic      obstructive pulmonary disease.  3. Severe chronic obstructive pulmonary disease by chest x-ray.  4. Adenocarcinoma the rectum with pending likely AP resection.  5. Community-acquired pneumonia under treatment.  6. Severe iron deficiency  anemia secondary to above, status post 4      unit transfusion.  7. Severe protein calorie malnutrition.  8. History of hypertension.  9. Resolved lower extremity edema likely secondary to protein calorie      malnutrition and severe anemia also possibly including cor      pulmonale.  10.No evidence of left heart failure on this admission.   RECOMMENDATIONS:  1. We will obtain an adenosine Myoview because of multiple risk      factors for coronary heart disease.  2. Will probably tolerate adenosine infusion since not actively      wheezing.  3. Recommend preoperative PFTs.  4. No specific therapy is available for cor pulmonale other than as      provided diuretics and treating underlying lung cause.  5. Provided above was unremarkable then the patient would be adequate      candidate from cardiac standpoint for general anesthesia.  However,      again pulmonary function tests should be obtained for most      appropriate pulmonary assessment preoperatively.      Francisca December, M.D.  Electronically Signed     JHE/MEDQ  D:  01/05/2007  T:  01/06/2007  Job:  [id number]   cc:   Ileana Roup, M.D.

## 2011-03-16 NOTE — H&P (Signed)
[facility]  Patient:    [name], [name]                         MRN: [id number] Adm. Date:  04/21/01 Attending:  Ollen Gross, M.D. Dictator:   Dorie Rank, P.A. CC:         Jarome Matin, M.D.   History and Physical  DATE OF BIRTH:  10-24-1941  CHIEF COMPLAINT:  Left hip pain.  HISTORY OF PRESENT ILLNESS:  [name] is a 70 year old white female who in 1991, sustained a left hip fracture and underwent an ORIF and later that same year, underwent a left total hip arthroplasty.  She has been doing well with her hip arthroplasty until approximately three or four months ago, she started having some increased pain to the hip and pain with ambulation.  Radiographs on February 14, 2001 revealed obvious loosening of the femoral component with a halo effect and ______ of the neck which accounts for the shortening of the left leg of approximately a half an inch.  It was noted she had a lateral protrusion of the ______  tip against the lateral cortex and some erosion there.  Due to her increased pain and radiographic result, it was felt that she would benefit from undergoing a left femoral revision with acetabular lining and possible cortical strut grafting.  The risks and benefits as well as the procedure were discussed with her and she elected to proceed.  PAST MEDICAL HISTORY:  Negative.  She has occasional upper respiratory infection but is otherwise unremarkable.  PAST SURGICAL HISTORY:  Left hip ORIF and left total hip arthroplasty, 1991.  MEDICATIONS:  Vioxx one p.o. q.d. p.r.n.  ALLERGIES:  No known drug allergies.  FAMILY HISTORY:  Mother deceased, age 32, of cancer, unsure what type.  Father deceased, age 58, history of myocardial infarction.  SOCIAL HISTORY:  She is married.  Her husband currently has lung cancer with metastasis.  She has three children.  She is a nondrinker.  She smokes approximately three to four cigarettes a day.  She  plans for home health physical therapy after hospital discharge.  FAMILY MEDICAL DOCTOR:  Dr. Jarome Matin.  REVIEW OF SYSTEMS:  GENERAL:  No fevers, chills, night sweats or bleeding tendencies.  PULMONARY:  No shortness of breath, productive cough or orthopnea.  CARDIOVASCULAR:  No chest pain, angina.  GI:  No nausea, vomiting, diarrhea, bloody stools or constipation.  GU:  No dysuria, hematuria or discharge.  NEUROLOGIC:  No seizures, headache or paralysis.  ENDOCRINE:  No known diabetes mellitus or thyroid disorders.  PHYSICAL EXAMINATION:  GENERAL:  A thin, pleasant 70 year old white female who appears slightly anxious today.  VITALS:  Pulse 78, respirations 20, blood pressure 140/70.  HEENT:  Head is atraumatic, normocephalic.  Oropharynx is clear.  NECK:  Supple.  Negative for carotid bruits bilaterally.  CHEST:  Lungs are clear to auscultation bilaterally.  No wheezes, rhonchi or rales.  BREASTS:  Not pertinent to present illness.  HEART:  S1 and S2, negative for murmur, rub or gallop.  Heart is regular in rate and rhythm.  ABDOMEN:  Soft, nontender.  Positive bowel sounds.  GENITOURINARY:  Not pertinent to present illness.  EXTREMITIES:  She has decreased range of motion to the left hip.  She is half an inch shorter on the left leg compared to the right.  She has tenderness to palpation about the lateral thigh over  the lower end of the incision. Dorsalis pedis pulses 2+ bilaterally.  Posterior tibialis pulses 2+ bilaterally.  SKIN:  There is telangiectases to bilateral lower extremities.  No rashes or lesions appreciated on exam.  LABORATORIES AND X-RAYS:  Preop hemoglobin is 8.4; this is currently being repeated.  IMPRESSION: 1. 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 2. Anemia.  PLAN:  The patient is scheduled for a left femoral revision, acetabular liner revision, possible cortical strut grafting on Monday, April 21, 2001.  She is currently having a repeat  hemoglobin to see if this is some type of lab error. If it is still low, she will possibly need further workup for this prior to her surgery. DD:  04/18/01 TD:  04/20/01 Job: [id number]

## 2011-06-04 ENCOUNTER — Inpatient Hospital Stay (HOSPITAL_COMMUNITY)
Admission: EM | Admit: 2011-06-04 | Discharge: 2011-06-10 | DRG: 919 | Disposition: A | Discharge status: Home / Self Care | Payer: Medicare Other | Source: Ambulatory Visit | Attending: Internal Medicine | Admitting: Internal Medicine

## 2011-06-04 DIAGNOSIS — R5381 Other malaise: Secondary | ICD-10-CM | POA: Diagnosis present

## 2011-06-04 DIAGNOSIS — I5033 Acute on chronic diastolic (congestive) heart failure: Secondary | ICD-10-CM | POA: Diagnosis present

## 2011-06-04 DIAGNOSIS — D72829 Elevated white blood cell count, unspecified: Secondary | ICD-10-CM | POA: Diagnosis present

## 2011-06-04 DIAGNOSIS — D509 Iron deficiency anemia, unspecified: Secondary | ICD-10-CM | POA: Diagnosis present

## 2011-06-04 DIAGNOSIS — Y849 Medical procedure, unspecified as the cause of abnormal reaction of the patient, or of later complication, without mention of misadventure at the time of the procedure: Secondary | ICD-10-CM | POA: Diagnosis present

## 2011-06-04 DIAGNOSIS — J4489 Other specified chronic obstructive pulmonary disease: Secondary | ICD-10-CM | POA: Diagnosis present

## 2011-06-04 DIAGNOSIS — I10 Essential (primary) hypertension: Secondary | ICD-10-CM | POA: Diagnosis present

## 2011-06-04 DIAGNOSIS — J449 Chronic obstructive pulmonary disease, unspecified: Secondary | ICD-10-CM | POA: Diagnosis present

## 2011-06-04 DIAGNOSIS — Z85048 Personal history of other malignant neoplasm of rectum, rectosigmoid junction, and anus: Secondary | ICD-10-CM

## 2011-06-04 DIAGNOSIS — E46 Unspecified protein-calorie malnutrition: Secondary | ICD-10-CM | POA: Diagnosis present

## 2011-06-04 DIAGNOSIS — I509 Heart failure, unspecified: Secondary | ICD-10-CM | POA: Diagnosis present

## 2011-06-04 DIAGNOSIS — E876 Hypokalemia: Secondary | ICD-10-CM | POA: Diagnosis present

## 2011-06-04 DIAGNOSIS — IMO0002 Reserved for concepts with insufficient information to code with codable children: Principal | ICD-10-CM | POA: Diagnosis present

## 2011-06-05 ENCOUNTER — Emergency Department (HOSPITAL_COMMUNITY): Payer: Medicare Other

## 2011-06-05 LAB — URINE MICROSCOPIC-ADD ON

## 2011-06-05 LAB — COMPREHENSIVE METABOLIC PANEL
ALT: 22 U/L (ref 0–35)
Albumin: 3.6 g/dL (ref 3.5–5.2)
Alkaline Phosphatase: 118 U/L — ABNORMAL HIGH (ref 39–117)
BUN: 20 mg/dL (ref 6–23)
Calcium: 10.2 mg/dL (ref 8.4–10.5)
GFR calc Af Amer: >60 mL/min (ref >60)
Glucose, Bld: 90 mg/dL (ref 70–99)
Potassium: 3.7 mEq/L (ref 3.5–5.1)
Sodium: 139 mEq/L (ref 135–145)
Total Protein: 7.3 g/dL (ref 6.0–8.3)

## 2011-06-05 LAB — CBC
HCT: 40.5 % (ref 36.0–46.0)
Hemoglobin: 13.7 g/dL (ref 12.0–15.0)
MCH: 32 pg (ref 26.0–34.0)
MCV: 94.6 fL (ref 78.0–100.0)
MCV: 95 fL (ref 78.0–100.0)
Platelets: 144 10*3/uL — ABNORMAL LOW (ref 150–400)
RBC: 4.28 MIL/uL (ref 3.87–5.11)
RBC: 4.36 MIL/uL (ref 3.87–5.11)
RDW: 14.3 % (ref 11.5–15.5)
WBC: 3.7 10*3/uL — ABNORMAL LOW (ref 4.0–10.5)
WBC: 4.4 10*3/uL (ref 4.0–10.5)

## 2011-06-05 LAB — URINALYSIS, ROUTINE W REFLEX MICROSCOPIC
Glucose, UA: NEGATIVE mg/dL
Ketones, ur: NEGATIVE mg/dL
Nitrite: NEGATIVE
Specific Gravity, Urine: 1.009 (ref 1.005–1.030)
pH: 7 (ref 5.0–8.0)

## 2011-06-05 LAB — DIFFERENTIAL
Basophils Absolute: 0 10*3/uL (ref 0.0–0.1)
Basophils Relative: 1 % (ref 0–1)
Eosinophils Absolute: 0.1 10*3/uL (ref 0.0–0.7)
Eosinophils Relative: 3 % (ref 0–5)
Neutrophils Relative %: 65 % (ref 43–77)

## 2011-06-05 LAB — SAMPLE TO BLOOD BANK

## 2011-06-05 LAB — TROPONIN I: Troponin I: <0.3 ng/mL (ref <0.30)

## 2011-06-05 MED ORDER — IOHEXOL 300 MG/ML  SOLN
100.0000 mL | Freq: Once | INTRAMUSCULAR | Status: AC | PRN
  Administered 2011-06-05: 100 mL via INTRAVENOUS

## 2011-06-06 LAB — BASIC METABOLIC PANEL
BUN: 14 mg/dL (ref 6–23)
Calcium: 9 mg/dL (ref 8.4–10.5)
Creatinine, Ser: 1.02 mg/dL (ref 0.50–1.10)
GFR calc non Af Amer: 53 mL/min — ABNORMAL LOW (ref >60)
Glucose, Bld: 145 mg/dL — ABNORMAL HIGH (ref 70–99)
Potassium: 4.9 mEq/L (ref 3.5–5.1)

## 2011-06-06 LAB — CBC
HCT: 39.1 % (ref 36.0–46.0)
Hemoglobin: 13.1 g/dL (ref 12.0–15.0)
Hemoglobin: 12.2 g/dL (ref 12.0–15.0)
MCH: 32.1 pg (ref 26.0–34.0)
MCH: 31.7 pg (ref 26.0–34.0)
MCHC: 33.5 g/dL (ref 30.0–36.0)
MCHC: 33.4 g/dL (ref 30.0–36.0)
MCV: 95.8 fL (ref 78.0–100.0)
MCV: 95.4 fL (ref 78.0–100.0)
Platelets: 109 10*3/uL — ABNORMAL LOW (ref 150–400)
RDW: 14.6 % (ref 11.5–15.5)
RDW: 14.7 % (ref 11.5–15.5)
RDW: 14.7 % (ref 11.5–15.5)
WBC: 13.4 10*3/uL — ABNORMAL HIGH (ref 4.0–10.5)

## 2011-06-07 LAB — BASIC METABOLIC PANEL
CO2: 24 mEq/L (ref 19–32)
Calcium: 9.3 mg/dL (ref 8.4–10.5)
GFR calc non Af Amer: >60 mL/min (ref >60)
Sodium: 140 mEq/L (ref 135–145)

## 2011-06-07 LAB — CBC
Hemoglobin: 12.7 g/dL (ref 12.0–15.0)
MCHC: 33.4 g/dL (ref 30.0–36.0)
Platelets: 126 10*3/uL — ABNORMAL LOW (ref 150–400)
RBC: 4.22 MIL/uL (ref 3.87–5.11)
RDW: 14.8 % (ref 11.5–15.5)
WBC: 12.3 10*3/uL — ABNORMAL HIGH (ref 4.0–10.5)
WBC: 13.1 10*3/uL — ABNORMAL HIGH (ref 4.0–10.5)

## 2011-06-07 LAB — GLUCOSE, CAPILLARY: Glucose-Capillary: 179 mg/dL — ABNORMAL HIGH (ref 70–99)

## 2011-06-08 ENCOUNTER — Inpatient Hospital Stay (HOSPITAL_COMMUNITY): Payer: Medicare Other

## 2011-06-08 LAB — CBC
HCT: 38.4 % (ref 36.0–46.0)
Hemoglobin: 12.8 g/dL (ref 12.0–15.0)
RBC: 4.07 MIL/uL (ref 3.87–5.11)
WBC: 9.2 10*3/uL (ref 4.0–10.5)

## 2011-06-08 LAB — TYPE AND SCREEN
ABO/RH(D): O POS
Unit division: 0

## 2011-06-08 LAB — BASIC METABOLIC PANEL
BUN: 22 mg/dL (ref 6–23)
Chloride: 107 mEq/L (ref 96–112)
GFR calc Af Amer: >60 mL/min (ref >60)
Glucose, Bld: 108 mg/dL — ABNORMAL HIGH (ref 70–99)
Potassium: 2.8 mEq/L — ABNORMAL LOW (ref 3.5–5.1)

## 2011-06-09 LAB — BASIC METABOLIC PANEL
BUN: 30 mg/dL — ABNORMAL HIGH (ref 6–23)
GFR calc non Af Amer: >60 mL/min (ref >60)
Glucose, Bld: 93 mg/dL (ref 70–99)
Potassium: 3.4 mEq/L — ABNORMAL LOW (ref 3.5–5.1)

## 2011-06-09 LAB — CBC
HCT: 39 % (ref 36.0–46.0)
Hemoglobin: 12.8 g/dL (ref 12.0–15.0)
MCH: 31 pg (ref 26.0–34.0)
MCHC: 32.8 g/dL (ref 30.0–36.0)

## 2011-06-10 LAB — BASIC METABOLIC PANEL
BUN: 32 mg/dL — ABNORMAL HIGH (ref 6–23)
CO2: 31 mEq/L (ref 19–32)
Chloride: 103 mEq/L (ref 96–112)
Creatinine, Ser: 0.82 mg/dL (ref 0.50–1.10)
Glucose, Bld: 80 mg/dL (ref 70–99)

## 2011-06-10 LAB — HEMOGLOBIN AND HEMATOCRIT, BLOOD: HCT: 38.6 % (ref 36.0–46.0)

## 2011-06-10 NOTE — H&P (Signed)
[name], Case NO.:  [id number]  MEDICAL RECORD NO.:  [id number]  LOCATION:  [facility]                         FACILITY:  [facility]  PHYSICIAN:  Isidor Holts, M.D.  DATE OF BIRTH:  16-Feb-1941  DATE OF ADMISSION:  06/05/2011 DATE OF DISCHARGE:                             HISTORY & PHYSICAL   PRIMARY MD:  Dr. Holley Bouche.  PRIMARY SURGEON:  Dr. Luretha Murphy.  PRIMARY ONCOLOGIST:  Dr. Pierce Crane.  PRIMARY RADIATION ONCOLOGIST:  Dr. Kathrynn Running.  PRIMARY GASTROENTEROLOGIST:  Dr. Jeani Hawking.  CHIEF COMPLAINT:  Intermittent bleeding per rectum, since May 27, 2011.  HISTORY OF PRESENT ILLNESS:  This is a 70 year old female.  She is somewhat hard of hearing, but otherwise is a very good historian.  She has a known history of rectal adenocarcinoma diagnosed in 2008, is status post chemotherapy, radiation therapy and subsequent surgery.  She has been on follow up with her primary surgeon Dr. Luretha Murphy and Dr. Donnie Coffin, but has not seen these doctors for some time now.  She was in her usual state of health until May 27, 2011 when she had her first episode of passage of red blood per rectum.  She denies associated abdominal or rectal pain, vomited once on that day.  However, had no diarrhea and has not vomited since.  Since then, she has had intermittent passage of blood, on occasion with clots. The last episode was on June 04, 2011. She denies chest pain.  Denies shortness of breath.  Denies fever or chills.  Denies cough.  Her appetite is very good.  She denies any weakness.  The patient's daughter got concerned and brought her to the emergency department.  PAST MEDICAL HISTORY: 1. Rectal adenocarcinoma diagnosed 2008, status post neoadjuvant     chemotherapy and radiation. 2. Status post laparoscopic-assisted low anterior resection with     primary colorectal anastomosis April 01, 2007 for #1 above. 3. Status post loop colostomy for diversion  secondary to dehisced     colorectal anastomosis and pelvic abscess April 22, 2007. 4. Status post takedown of loop colostomy September 02, 2007. 5. Left hip fracture, status post ORIF 1991. 6. Status post left total hip replacement 1991 followed by left     acetabular revision and cortical strut graft in June 2002. 7. Status post hysterectomy and bilateral salpingo-oophorectomy. 8. COPD. 9. Questionable history of cor pulmonale. 10.DJD. 11.Hard of hearing. 12.Hypertension. 13.History of diastolic dysfunction/CHF, ejection fraction 65% to 75%     in March 2008. 14.Chronic iron-deficiency anemia. 15.History of gastric AVMs. 16.Protein calorie malnutrition.  MEDICATION HISTORY: 1. Calcium carbonate 600 mg p.o. b.i.d. 2. Metoprolol tartrate 25 mg p.o. b.i.d. 3. Furosemide 40 mg p.o. daily. 4. Clonidine 0.2 mg p.o. b.i.d.  ALLERGIES:  CIPROFLOXACIN AND MORPHINE.  REVIEW OF SYSTEMS:  As per HPI and chief complaint, otherwise negative.  SOCIAL HISTORY:  The patient is widowed about 8 years now.  Has three offspring, all are grown. The eldest is about 71 years of age.  Ex- smoker, used to smoke quite heavily, but quit approximately two and half years ago, nondrinker has no history of drug abuse.  FAMILY HISTORY:  Noncontributory.  PHYSICAL EXAMINATION:  VITALS:  Temperature 98.4, pulse 63 per minute regular, respiratory rate 18, BP 177/84 mmHg, pulse oximeter 95% on room air. GENERAL:  The patient did not appear to be in obvious acute distress at time of this evaluation, alert, communicative, not short of breath at rest. HEENT: No clinical pallor or jaundice.  No conjunctival injection. Hydration status appears fair. NECK:  Supple.  JVP not seen.  No palpable lymphadenopathy.  No palpable goiter.  The patient does appear underweight. CHEST:  Clinically clear to auscultation.  No wheezes or crackles. HEART:  Sounds are heard.  Normal regular.  No murmurs. ABDOMEN:  Flat, soft,  nontender.  Surgical scar is noted in the mid lower abdomen.  There is no palpable organomegaly.  No palpable masses. Normal bowel sounds.  She does have mild discomfort in the left lower quadrant. LOWER EXTREMITY:  Examination no pitting edema.  Palpable peripheral pulses. MUSCULOSKELETAL:  Remarkable only for generalized osteoarthritis. CENTRAL NERVOUS SYSTEM:  No focal neurologic deficit on gross examination.  INVESTIGATIONS:  CBC:  WBC 4.4, hemoglobin 13.9, hematocrit 41.4, platelets 144, INR is 1.09.  Electrolytes:  Sodium 139, potassium 3.7, chloride 102, CO2 31, BUN 20, creatinine 1.06, glucose 90.  Total bilirubin 0.4, alkaline phosphatase 118, AST 39, ALT 22.  Troponin I is less than 0.30.  Urinalysis is negative.  Fecal occult blood testing is positive.  Abdominal/pelvic CT scan done June 05, 2011 shows no acute abdominal pelvic findings.  No mass lesions or adenopathy.  She has mild rectal wall thickening in the region of anastomosis and stable changes in the right hip.  A 12-lead EKG June 05, 2011 shows sinus rhythm regular, normal axis 58 per minute, baseline artifact, old Q-waves in V1- V2 essentially unchanged from 12-lead EKG of August 28, 2007.  ASSESSMENT AND PLAN: 1. Lower gastrointestinal bleed.  This is intermittent, etiology is     unclear.  At the present time, the patient is hemodynamically     stable.  She has no anemia.  She has a rather unremarkable     abdominal/pelvic CT scan.  She will need endoscopic     evaluation in view of known history of colonic malignancy and to     rule out other etiologies.  We shall consult GI accordingly.  Dr.     Elnoria Howard has been called on consultation.  Meanwhile, we shall admit for     observation, allow clear fluids only p.o. and do serial     hemoglobin/hematocrit.  We shall type and cross and hold 2 units of     PRBC.  2. Uncontrolled hypertension.  We shall continue preadmission     antihypertensive medications and  adjust as indicated.  3. Chronic obstructive pulmonary disease.  This is asymptomatic and     stable.  4. History of diastolic congestive heart failure and possible cor     pulmonale, none of this is clinically evident at this time.  Further management will depend on clinical course.     Isidor Holts, M.D.     CO/MEDQ  D:  06/05/2011  T:  06/05/2011  Job:  [id number]  cc:   Holley Bouche, M.D. [place] Daphine Deutscher, MD Pierce Crane, M.D., F.R.C.P.C. Oneita Hurt, M.D. [place] Hawks Elnoria Howard, MD  Electronically Signed by Isidor Holts M.D. on 06/10/2011 07:51:09 PM

## 2011-06-18 NOTE — Discharge Summary (Signed)
[name], [name]                ACCOUNT NO.:  [id number]  MEDICAL RECORD NO.:  [id number]  LOCATION:  5509                         FACILITY:  [facility]  PHYSICIAN:  Kela Millin, M.D.DATE OF BIRTH:  May 09, 1941  DATE OF ADMISSION:  06/04/2011 DATE OF DISCHARGE:  06/10/2011                        DISCHARGE SUMMARY - REFERRING   DISCHARGE DIAGNOSES: 1. Lower gastrointestinal bleed/hematochezia -- per GI most likely     secondary to friable mucosa at her anastomosis site which is about     10 cm from the anal verge, resolved. 2. Uncontrolled/malignant hypertension. 3. Chronic obstructive pulmonary disease. 4. Acute on chronic diastolic heart failure. 5. Questionable history of cor pulmonale. 6. History of rectal cancer in 2008 and status post neoadjuvant chemo     and radiation, also status post laparoscopic-assisted low anterior     resection with primary colorectal anastomosis in June 2008. 7. History of loop colostomy for diversion secondary to dehisced     colorectal anastomosis and pelvic abscess in June 2008 and     subsequent takedown of loop colostomy in November 2008. 8. Degenerative joint disease. 9. Hard of hearing. 10.Chronic iron-deficiency anemia. 11.History of gastric arteriovenous malformations. 12.Protein-calorie malnutrition.  CONSULTATIONS:  Gastroenterology -- Dr. Theda Belfast.  PROCEDURES: 1. CT scan of the abdomen and pelvis on June 05, 2011 -- no acute     abdominal/pelvic findings, mass, lesions or adenopathy.  Mild     rectal wall thickening in the region of the anastomosis.  This     could be due to radiation change.  Stable changes involving the     right hip. 2. Chest x-ray on June 08, 2011 -- COPD.  Possible pulmonary     arterial hypertension.  No acute findings.  BRIEF HISTORY:  The patient is a pleasant 70 year old white female with the above-listed medical problems who presented with complaints of intermittent rectal bleeding  beginning on May 27, 2011.  It was noted as above that the patient does have a history of rectal adenocarcinoma and is status post surgical resection as well as radiation and chemotherapy.  She reported that she was in her usual state of health until May 27, 2011, when she had the first episode of rectal bleeding and since then intermittently she had been having rectal bleeding and on occasion with clots with the last episode that she had on June 04, 2011.  She denies fevers or chills.  She was brought to the ED because her daughter got concerned.  She denied diarrhea, also no nausea or vomiting.  She was admitted for further evaluation and management.  HOSPITAL COURSE: 1. Lower GI bleed/hematochezia -- as discussed above, upon admission a     CT scan of her abdomen and pelvis were done and the results as     stated above.  Her hemoglobin was noted to be stable at 13.9 and     serial hemoglobins were done throughout her hospitalization and     remained stable.  Her hemoglobin today is 13 and she has had no     further rectal bleeding while in the hospital.  Dr. Loreta Ave was     consulted upon admission and  her partner, Dr. Elnoria Howard saw the patient     and stated that she does have a history of friable mucosa at the     anastomosis which is 10 cm from the anal verge and his impression     was that it was likely where the bleeding was coming from.  He     stated that the area was difficult to treat even with the use of     epinephrine hemoclipping.  We talked with Dr. Loreta Ave about the     patient subsequently and she stated that there was no plan for any     further intervention at this point since the bleeding had resolved     and she was at increased risk of bleeding with any endoscopy.  She     stated that if there was no recurrence of the bleeding, the patient     was to follow up at the office with her.  [name] has not had     any further bleeding.  Her hemoglobin has remained stable.   She is     tolerating p.o. well and she will be discharged at this time for     outpatient followup. 2. Malignant/uncontrolled hypertension -- the patient's blood     pressures while in the hospital were persistently elevated, and she     was restarted on her clonidine and metoprolol, but she had an     episode of bradycardia and so her beta-blocker was held.     Subsequently, the bradycardia resolved and the beta-blocker was     resumed, but neither the clonidine nor the beta-blocker dose were     increased in order not to have any recurrence of bradycardia or     further bradycardic episodes.  Hydralazine was added to her     treatment regimen and her Lasix was also restarted.  The Solu-     Medrol that the patient has been started on was a contributing     factor to her persistent hypertension after hospitalization and     with tapering down the Solu-Medrol, her blood pressure control     improved as well as discontinuing her IV fluids and restarting her     Lasix.  Her blood pressures are better controlled at this time and     she will be discharged home on her preadmission antihypertensives     along with the hydralazine and she is to follow up with her primary     care physician outpatient. 3. Congestive heart failure, acute on chronic diastolic -- the patient     had been hydrated with IV fluids since she was admitted with a GI     bleed.  Her Lasix had also been held secondary to lower GI     bleed/hematochezia.  Subsequently, the patient developed shortness     of breath and her Lasix was resumed and she was also given IV     doses.  She had a followup chest x-ray done, which showed no acute     findings.  Her breathing is at baseline at this time and her O2     sats on room air 94% and she will be discharged home to follow up     outpatient. 4. Chronic obstructive pulmonary disease -- she was placed on Solu-     Medrol following her admission and this was subsequently  tapered     and she was changed to oral prednisone.  She was also placed on     Spiriva along with nebulized bronchodilators as needed.  She is     clinically improved at this time and will be discharged home on     prednisone taper along with the Spiriva and Xopenex MDI.  Her O2     sats on room air are 94% and so she does not meet the criteria for     home O2 at this time. 5. History of rectal cancer -- the patient is to follow up outpatient     with Dr. Donnie Coffin as needed or as scheduled. 6. Hypokalemia -- her potassium was replaced during this hospital     stay. 7. Deconditioning -- the patient was seen by Physical Therapy while in     the hospital and they recommended home health PT, but the patient     declined stating that she can get alone just fine on her own and     that she uses a cane.  DISCHARGE MEDICATIONS: 1. Tylenol 650 mg q.4 h p.r.n. 2. Pepcid 40 mg p.o. at bedtime. 3. Hydralazine 10 mg p.o. t.i.d. 4. KCl 20 mEq one p.o. daily. 5. Prednisone taper as per med rec form. 6. Spiriva 18 mcg inhalation daily. 7. Xopenex MDI 2 puffs q.4-6 h p.r.n. 8. Calcium carbonate 1 tablet b.i.d. 9. Clonidine 0.2 mg p.o. b.i.d. 10.Lasix 40 mg p.o. daily. 11.Metoprolol 25 mg p.o. b.i.d.  FOLLOWUP CARE: 1. Dr. Holley Bouche in 1-2 weeks. 2. Dr. Anselmo Rod in 2-3 weeks, call for appointment. 3. Dr. Pierce Crane as needed was scheduled.  DISCHARGE CONDITION:  Improved/stable.     Kela Millin, M.D.     ACV/MEDQ  D:  06/10/2011  T:  06/10/2011  Job:  [id number]  cc:   Holley Bouche, M.D. Anselmo Rod, MD, Christena Flake, M.D., F.R.C.P.C.  Electronically Signed by Donnalee Curry M.D. on 06/18/2011 08:10:36 AM

## 2011-07-13 NOTE — Consult Note (Signed)
[name], [name] NO.:  [id number]  MEDICAL RECORD NO.:  [id number]  LOCATION:  5509                         FACILITY:  [facility]  PHYSICIAN:  Jordan Hawks. Elnoria Howard, MD    DATE OF BIRTH:  1941-06-30  DATE OF CONSULTATION:  06/05/2011 DATE OF DISCHARGE:                                CONSULTATION   REASON FOR CONSULTATION:  Hematochezia.  PRIMARY CARE PROVIDER:  Holley Bouche, MD  SURGEON:  Thornton Park. Daphine Deutscher, MD  ONCOLOGIST:  Pierce Crane, MD, FRCPC  RADIATION ONCOLOGIST:  Oneita Hurt, MD  HISTORY OF PRESENT ILLNESS:  This is a 70 year old female with a past medical history of rectal adenocarcinoma, status post resection in 2008 as well as chemotherapy and radiation, status post hip fracture in 1991, COPD, DJD, hypertension, mild CHF, gastric AVMs, and deafness, who was admitted to the hospital with complaints of hematochezia.  The patient states that she has had hematochezia for number of days.  In fact, lastSunday she had a significant amount of hematochezia and subsequently it has been milder, but just prior to her admission, she started to have a bit more bleeding.  As a result, she presented to the emergency room for further evaluation and treatment.  The patient is known to have a friable area at the anastomosis from APR at approximately 10 cm at that area.  She has had issues with bleeding.  She has been treated in the past with epinephrine as well as hemoclipping.  Her most recent colonoscopy was in February 2012, revealed that there is bleeding with just some minor trauma secondary to the scope passage along the area, but there is no evidence of any recurrent malignancy, but with her bleeding, her daughter was concerned and then she presented to the emergency room for further evaluation and treatment.  PAST MEDICAL AND PAST SURGICAL HISTORY:  As stated above.  FAMILY HISTORY:  Noncontributory.  SOCIAL HISTORY:  Negative for alcohol, tobacco  or, illicit drug use. She used to smoke, but quit 2-1/2 years ago.  ALLERGIES:  CIPROFLOXACIN and MORPHINE.  MEDICATIONS: 1. Chlorhexidine. 2. Clonidine. 3. Diphenhydramine. 4. Famotidine. 5. Solu-Medrol. 6. Metoprolol. 7. Zofran. 8. Oxycodone.  PHYSICAL EXAMINATION:  VITAL SIGNS:  Blood pressure is 120/71, heart rate is 90, respirations 20, temperature is 98.2. GENERAL:  The patient is in no acute distress, alert and oriented. HEENT:  Normocephalic, atraumatic.  Extraocular muscles intact. NECK:  Supple.  No lymphadenopathy. LUNGS:  Clear to auscultation bilaterally. CARDIOVASCULAR:  Regular rate and rhythm. ABDOMEN:  Flat, soft, nontender and nondistended. EXTREMITIES:  No clubbing, cyanosis or edema.  LABORATORY VALUES:  White blood cell count is 3.7, hemoglobin 13.7, MCV is 94.6, platelets at 126.  IMPRESSION: 1. History of friable mucosa at the anastomosis which is 10 cm from     the anal verge. 2. Hematochezia. 3. History of rectal cancer.  I suspect that the patient has bleeding from this site again. Currently, her hemoglobin is stable and is not of any significant concern.  This area difficult to treat even with the use of epinephrine hemoclipping.  She still has some oozing issues.  I am uncertain if any repeat intervention  at this time will be of any marked benefit for the patient.  Again, her colonoscopy in February of this year resulted in some bleeding with passage of the scope in that area and epinephrine was injected which apparently arrested it for the time being.  I think at this time, I will confer with Dr. Loreta Ave in regard to her thoughts about this case as she is her primary gastroenterologist.  At this time recommend supportive care, transfuse as needed, and to follow her CBC.     Jordan Hawks Elnoria Howard, MD     PDH/MEDQ  D:  06/05/2011  T:  06/06/2011  Job:  [id number]  cc:   Holley Bouche, M.D.  Electronically Signed by Jeani Hawking MD on 07/13/2011  08:49:01 AM

## 2011-08-07 LAB — CBC
HCT: 32.1 — ABNORMAL LOW
Hemoglobin: 10.6 — ABNORMAL LOW
MCV: 84.3
RBC: 3.8 — ABNORMAL LOW
WBC: 4.2

## 2011-08-07 LAB — BASIC METABOLIC PANEL
BUN: 6
CO2: 26
GFR calc non Af Amer: >60
Glucose, Bld: 94
Potassium: 4.4

## 2011-08-08 LAB — BASIC METABOLIC PANEL
BUN: 11
CO2: 24
Chloride: 104
Creatinine, Ser: 0.73
Glucose, Bld: 87
Potassium: 4.1

## 2011-08-08 LAB — CBC
HCT: 37.7
MCHC: 33.4
MCV: 82.9
Platelets: 199
RDW: 20.1 — ABNORMAL HIGH
WBC: 4

## 2011-08-13 LAB — PHOSPHORUS
Phosphorus: 3.1
Phosphorus: 3.2

## 2011-08-13 LAB — CBC
HCT: 22.2 — ABNORMAL LOW
HCT: 25.8 — ABNORMAL LOW
HCT: 28.5 — ABNORMAL LOW
Hemoglobin: 8.5 — ABNORMAL LOW
Hemoglobin: 8.9 — ABNORMAL LOW
MCHC: 32.9
MCHC: 33
MCV: 88.4
MCV: 90.2
MCV: 91.2
Platelets: 248
Platelets: 337
RBC: 2.23 — ABNORMAL LOW
RBC: 2.83 — ABNORMAL LOW
RBC: 3.05 — ABNORMAL LOW
RDW: 19.1 — ABNORMAL HIGH
WBC: 10.7 — ABNORMAL HIGH
WBC: 12.1 — ABNORMAL HIGH
WBC: 9.3

## 2011-08-13 LAB — COMPREHENSIVE METABOLIC PANEL
ALT: 30
Albumin: 1.3 — ABNORMAL LOW
Albumin: 1.3 — ABNORMAL LOW
Albumin: 1.4 — ABNORMAL LOW
Alkaline Phosphatase: 642 — ABNORMAL HIGH
BUN: 22
BUN: 25 — ABNORMAL HIGH
BUN: 30 — ABNORMAL HIGH
CO2: 28
Calcium: 7.8 — ABNORMAL LOW
Calcium: 8.4
Chloride: 102
Chloride: 107
Creatinine, Ser: 0.36 — ABNORMAL LOW
Creatinine, Ser: 0.39 — ABNORMAL LOW
Creatinine, Ser: 0.54
GFR calc non Af Amer: >60
Glucose, Bld: 83
Potassium: 3.8
Sodium: 136
Total Bilirubin: 0.4
Total Bilirubin: 0.4
Total Bilirubin: 0.6
Total Protein: 5 — ABNORMAL LOW
Total Protein: 5.4 — ABNORMAL LOW

## 2011-08-13 LAB — BASIC METABOLIC PANEL
BUN: 22
BUN: 34 — ABNORMAL HIGH
CO2: 29
Chloride: 105
Chloride: 105
Chloride: 108
GFR calc Af Amer: >60
GFR calc Af Amer: >60
Glucose, Bld: 99
Potassium: 4.3
Potassium: 4.3
Potassium: 4.4
Sodium: 137
Sodium: 138

## 2011-08-13 LAB — DIFFERENTIAL
Basophils Absolute: 0.1
Basophils Absolute: 0.1
Eosinophils Relative: 8 — ABNORMAL HIGH
Lymphocytes Relative: 8 — ABNORMAL LOW
Lymphs Abs: 1
Monocytes Absolute: 0.8 — ABNORMAL HIGH
Monocytes Absolute: 1.2 — ABNORMAL HIGH
Monocytes Relative: 10
Neutro Abs: 10.2 — ABNORMAL HIGH
Neutrophils Relative %: 71

## 2011-08-13 LAB — HEMOGLOBIN AND HEMATOCRIT, BLOOD: Hemoglobin: 8.7 — ABNORMAL LOW

## 2011-08-13 LAB — TRIGLYCERIDES: Triglycerides: 53

## 2011-08-13 LAB — RETICULOCYTES: RBC.: 2.98 — ABNORMAL LOW

## 2011-08-13 LAB — CHOLESTEROL, TOTAL
Cholesterol: 83
Cholesterol: 86

## 2011-08-13 LAB — CROSSMATCH: ABO/RH(D): O POS

## 2011-08-14 LAB — COMPREHENSIVE METABOLIC PANEL
ALT: 100 — ABNORMAL HIGH
ALT: 11
ALT: 12
ALT: 14
ALT: 26
AST: 127 — ABNORMAL HIGH
AST: 20
AST: 20
AST: 36
Albumin: 1 — ABNORMAL LOW
Albumin: 1 — ABNORMAL LOW
Albumin: 1.3 — ABNORMAL LOW
Alkaline Phosphatase: 370 — ABNORMAL HIGH
Alkaline Phosphatase: 439 — ABNORMAL HIGH
Alkaline Phosphatase: 483 — ABNORMAL HIGH
Alkaline Phosphatase: 695 — ABNORMAL HIGH
BUN: 21
CO2: 26
CO2: 26
Calcium: 7.6 — ABNORMAL LOW
Chloride: 105
Chloride: 110
Creatinine, Ser: 0.45
GFR calc Af Amer: >60
GFR calc Af Amer: >60
GFR calc Af Amer: >60
GFR calc non Af Amer: >60
GFR calc non Af Amer: >60
Glucose, Bld: 119 — ABNORMAL HIGH
Glucose, Bld: 127 — ABNORMAL HIGH
Potassium: 3.4 — ABNORMAL LOW
Potassium: 3.9
Potassium: 4
Sodium: 135
Sodium: 137
Sodium: 138
Total Bilirubin: 0.2 — ABNORMAL LOW
Total Bilirubin: 0.3
Total Protein: 4.4 — ABNORMAL LOW
Total Protein: 4.5 — ABNORMAL LOW
Total Protein: 5.1 — ABNORMAL LOW

## 2011-08-14 LAB — CBC
HCT: 25.4 — ABNORMAL LOW
HCT: 26.3 — ABNORMAL LOW
HCT: 28.3 — ABNORMAL LOW
Hemoglobin: 8.5 — ABNORMAL LOW
Hemoglobin: 8.6 — ABNORMAL LOW
Hemoglobin: 8.6 — ABNORMAL LOW
Hemoglobin: 8.7 — ABNORMAL LOW
Hemoglobin: 8.9 — ABNORMAL LOW
Hemoglobin: 9.2 — ABNORMAL LOW
Hemoglobin: 9.3 — ABNORMAL LOW
MCHC: 32.5
MCHC: 32.5
MCHC: 32.6
MCHC: 32.9
MCHC: 33
MCHC: 33.2
MCHC: 33.5
MCHC: 33.6
MCV: 92.5
MCV: 94.8
MCV: 96.5
Platelets: 333
Platelets: 445 — ABNORMAL HIGH
Platelets: 471 — ABNORMAL HIGH
Platelets: 522 — ABNORMAL HIGH
RBC: 2.71 — ABNORMAL LOW
RBC: 2.72 — ABNORMAL LOW
RBC: 2.74 — ABNORMAL LOW
RBC: 2.8 — ABNORMAL LOW
RBC: 2.87 — ABNORMAL LOW
RBC: 2.87 — ABNORMAL LOW
RDW: 18.2 — ABNORMAL HIGH
RDW: 18.6 — ABNORMAL HIGH
RDW: 18.6 — ABNORMAL HIGH
RDW: 19.4 — ABNORMAL HIGH
WBC: 13.3 — ABNORMAL HIGH
WBC: 13.6 — ABNORMAL HIGH
WBC: 14.4 — ABNORMAL HIGH
WBC: 16.5 — ABNORMAL HIGH
WBC: 17.7 — ABNORMAL HIGH
WBC: 20.1 — ABNORMAL HIGH

## 2011-08-14 LAB — BASIC METABOLIC PANEL
BUN: 22
BUN: 24 — ABNORMAL HIGH
BUN: 25 — ABNORMAL HIGH
CO2: 25
CO2: 26
CO2: 27
CO2: 28
CO2: 32
Calcium: 7.4 — ABNORMAL LOW
Calcium: 7.8 — ABNORMAL LOW
Calcium: 7.8 — ABNORMAL LOW
Calcium: 7.8 — ABNORMAL LOW
Calcium: 8.1 — ABNORMAL LOW
Chloride: 105
Chloride: 106
Creatinine, Ser: 0.34 — ABNORMAL LOW
Creatinine, Ser: 0.38 — ABNORMAL LOW
Creatinine, Ser: 0.44
Creatinine, Ser: 0.5
GFR calc Af Amer: >60
GFR calc Af Amer: >60
GFR calc Af Amer: >60
GFR calc Af Amer: >60
GFR calc Af Amer: >60
GFR calc non Af Amer: >60
GFR calc non Af Amer: >60
GFR calc non Af Amer: >60
Potassium: 4
Sodium: 138
Sodium: 138
Sodium: 140

## 2011-08-14 LAB — CULTURE, ROUTINE-ABSCESS: Gram Stain: NONE SEEN

## 2011-08-14 LAB — DIFFERENTIAL
Basophils Absolute: 0
Basophils Absolute: 0
Basophils Relative: 0
Basophils Relative: 1
Eosinophils Absolute: 0
Eosinophils Absolute: 0.2
Eosinophils Absolute: 0.3
Eosinophils Relative: 0
Eosinophils Relative: 1
Lymphs Abs: 0.5 — ABNORMAL LOW
Lymphs Abs: 0.5 — ABNORMAL LOW
Lymphs Abs: 0.9
Monocytes Absolute: 1.2 — ABNORMAL HIGH
Monocytes Absolute: 1.3 — ABNORMAL HIGH
Monocytes Absolute: 1.4 — ABNORMAL HIGH
Monocytes Absolute: 1.4 — ABNORMAL HIGH
Monocytes Absolute: 1.7 — ABNORMAL HIGH
Monocytes Relative: 10
Monocytes Relative: 8
Neutro Abs: 10.6 — ABNORMAL HIGH
Neutro Abs: 16.9 — ABNORMAL HIGH
Neutrophils Relative %: 89 — ABNORMAL HIGH
Neutrophils Relative %: 89 — ABNORMAL HIGH
Neutrophils Relative %: 90 — ABNORMAL HIGH

## 2011-08-14 LAB — BODY FLUID CELL COUNT WITH DIFFERENTIAL
Eos, Fluid: 0
Lymphs, Fluid: 20
Monocyte-Macrophage-Serous Fluid: 53
Monocyte-Macrophage-Serous Fluid: 9 — ABNORMAL LOW
Neutrophil Count, Fluid: 27 — ABNORMAL HIGH
Neutrophil Count, Fluid: 48 — ABNORMAL HIGH
Other Cells, Fluid: 0
Total Nucleated Cell Count, Fluid: 27
Total Nucleated Cell Count, Fluid: 64

## 2011-08-14 LAB — PHOSPHORUS
Phosphorus: 2.9
Phosphorus: 3
Phosphorus: 3.2

## 2011-08-14 LAB — BODY FLUID CULTURE: Culture: NO GROWTH

## 2011-08-14 LAB — PROTEIN, BODY FLUID

## 2011-08-14 LAB — VANCOMYCIN, TROUGH: Vancomycin Tr: 8.3

## 2011-08-14 LAB — BLOOD GAS, ARTERIAL
Bicarbonate: 21.1
FIO2: 1
O2 Saturation: 99.4
Patient temperature: 98.6
pH, Arterial: 7.244 — ABNORMAL LOW

## 2011-08-14 LAB — PROTIME-INR: INR: 1.4

## 2011-08-14 LAB — FOLATE: Folate: 10.9

## 2011-08-14 LAB — MAGNESIUM: Magnesium: 2

## 2011-08-14 LAB — RETICULOCYTES
Retic Count, Absolute: 144
Retic Ct Pct: 4.6 — ABNORMAL HIGH

## 2011-08-15 LAB — DIFFERENTIAL
Basophils Absolute: 0
Basophils Absolute: 0.1
Basophils Relative: 0
Eosinophils Absolute: 0.1
Eosinophils Absolute: 0.2
Eosinophils Relative: 2
Lymphocytes Relative: 3 — ABNORMAL LOW
Lymphocytes Relative: 4 — ABNORMAL LOW
Lymphs Abs: 0.7
Monocytes Absolute: 0.9 — ABNORMAL HIGH
Monocytes Absolute: 1.6 — ABNORMAL HIGH
Monocytes Relative: 3
Monocytes Relative: 8
Monocytes Relative: 9
Neutrophils Relative %: 86 — ABNORMAL HIGH
Neutrophils Relative %: 91 — ABNORMAL HIGH

## 2011-08-15 LAB — CBC
HCT: 25.2 — ABNORMAL LOW
HCT: 25.4 — ABNORMAL LOW
HCT: 25.8 — ABNORMAL LOW
HCT: 26.2 — ABNORMAL LOW
HCT: 26.3 — ABNORMAL LOW
Hemoglobin: 8.3 — ABNORMAL LOW
Hemoglobin: 8.7 — ABNORMAL LOW
Hemoglobin: 8.7 — ABNORMAL LOW
Hemoglobin: 8.8 — ABNORMAL LOW
MCHC: 32.9
MCHC: 32.9
MCHC: 33.2
MCHC: 33.6
MCHC: 33.7
MCHC: 33.8
MCV: 100.7 — ABNORMAL HIGH
MCV: 101.9 — ABNORMAL HIGH
MCV: 102 — ABNORMAL HIGH
MCV: 103.2 — ABNORMAL HIGH
MCV: 95.8
Platelets: 148 — ABNORMAL LOW
Platelets: 153
Platelets: 168
Platelets: 175
Platelets: 290
Platelets: 299
RBC: 2.36 — ABNORMAL LOW
RBC: 2.5 — ABNORMAL LOW
RBC: 2.53 — ABNORMAL LOW
RBC: 2.69 — ABNORMAL LOW
RBC: 2.71 — ABNORMAL LOW
RDW: 17.7 — ABNORMAL HIGH
RDW: 18.8 — ABNORMAL HIGH
RDW: 19.8 — ABNORMAL HIGH
WBC: 10.6 — ABNORMAL HIGH
WBC: 12.2 — ABNORMAL HIGH
WBC: 12.4 — ABNORMAL HIGH
WBC: 14.2 — ABNORMAL HIGH
WBC: 16 — ABNORMAL HIGH
WBC: 8.6
WBC: 8.9

## 2011-08-15 LAB — BASIC METABOLIC PANEL
BUN: 14
BUN: 19
CO2: 20
CO2: 24
Calcium: 8 — ABNORMAL LOW
Calcium: 8.1 — ABNORMAL LOW
Calcium: 8.4
Chloride: 100
Chloride: 107
Chloride: 108
Creatinine, Ser: 0.58
Creatinine, Ser: 0.6
GFR calc Af Amer: >60
GFR calc Af Amer: >60
GFR calc Af Amer: >60
GFR calc non Af Amer: >60
GFR calc non Af Amer: >60
GFR calc non Af Amer: >60
GFR calc non Af Amer: >60
Glucose, Bld: 100 — ABNORMAL HIGH
Glucose, Bld: 105 — ABNORMAL HIGH
Glucose, Bld: 88
Potassium: 3.9
Potassium: 4.1
Potassium: 4.8
Potassium: 5.5 — ABNORMAL HIGH
Sodium: 129 — ABNORMAL LOW
Sodium: 132 — ABNORMAL LOW
Sodium: 133 — ABNORMAL LOW
Sodium: 137

## 2011-08-15 LAB — COMPREHENSIVE METABOLIC PANEL
ALT: 10
ALT: 12
ALT: 14
ALT: 15
ALT: 15
AST: 14
AST: 14
AST: 15
AST: 18
Albumin: 1.1 — ABNORMAL LOW
Albumin: 1.2 — ABNORMAL LOW
Albumin: 1.3 — ABNORMAL LOW
Albumin: <1 — ABNORMAL LOW
Alkaline Phosphatase: 366 — ABNORMAL HIGH
Alkaline Phosphatase: 409 — ABNORMAL HIGH
Alkaline Phosphatase: 422 — ABNORMAL HIGH
BUN: 10
BUN: 16
CO2: 24
CO2: 25
CO2: 26
CO2: 26
CO2: 27
Calcium: 7.7 — ABNORMAL LOW
Calcium: 7.8 — ABNORMAL LOW
Calcium: 7.9 — ABNORMAL LOW
Calcium: 8.1 — ABNORMAL LOW
Chloride: 105
Chloride: 107
Chloride: 108
Chloride: 108
Creatinine, Ser: 0.43
Creatinine, Ser: 0.44
Creatinine, Ser: 0.47
GFR calc Af Amer: >60
GFR calc Af Amer: >60
GFR calc Af Amer: >60
GFR calc Af Amer: >60
GFR calc non Af Amer: >60
GFR calc non Af Amer: >60
GFR calc non Af Amer: >60
GFR calc non Af Amer: >60
GFR calc non Af Amer: >60
Glucose, Bld: 119 — ABNORMAL HIGH
Glucose, Bld: 131 — ABNORMAL HIGH
Potassium: 2.7 — CL
Potassium: 3.5
Potassium: 5.4 — ABNORMAL HIGH
Sodium: 136
Sodium: 136
Sodium: 136
Sodium: 136
Sodium: 137
Total Bilirubin: 0.4
Total Bilirubin: 0.7
Total Bilirubin: 0.8
Total Bilirubin: 0.9
Total Protein: 4.1 — ABNORMAL LOW
Total Protein: 4.4 — ABNORMAL LOW

## 2011-08-15 LAB — MAGNESIUM
Magnesium: 1.8
Magnesium: 1.8
Magnesium: 2.1

## 2011-08-15 LAB — CROSSMATCH
ABO/RH(D): O POS
Antibody Screen: NEGATIVE

## 2011-08-15 LAB — PHOSPHORUS
Phosphorus: 2.9
Phosphorus: 4.1

## 2011-08-15 LAB — PREALBUMIN
Prealbumin: 5.5 — ABNORMAL LOW
Prealbumin: 6 — ABNORMAL LOW

## 2011-08-15 LAB — CHOLESTEROL, TOTAL: Cholesterol: 68

## 2011-08-15 LAB — HDL CHOLESTEROL: HDL: 13 — ABNORMAL LOW

## 2011-08-15 LAB — TRIGLYCERIDES: Triglycerides: 76

## 2011-08-16 LAB — CBC
HCT: 24.6 — ABNORMAL LOW
HCT: 29.7 — ABNORMAL LOW
HCT: 30 — ABNORMAL LOW
HCT: 30.5 — ABNORMAL LOW
HCT: 31.2 — ABNORMAL LOW
Hemoglobin: 10.1 — ABNORMAL LOW
Hemoglobin: 10.3 — ABNORMAL LOW
MCHC: 33.7
MCHC: 33.7
MCHC: 33.7
MCHC: 33.9
MCHC: 34.4
MCHC: 35.5
MCV: 101.1 — ABNORMAL HIGH
MCV: 101.4 — ABNORMAL HIGH
MCV: 102.9 — ABNORMAL HIGH
MCV: 104.2 — ABNORMAL HIGH
Platelets: 106 — ABNORMAL LOW
Platelets: 124 — ABNORMAL LOW
Platelets: 192
Platelets: 235
RBC: 2.43 — ABNORMAL LOW
RBC: 2.99 — ABNORMAL LOW
RBC: 3.13 — ABNORMAL LOW
RDW: 17.9 — ABNORMAL HIGH
RDW: 18.4 — ABNORMAL HIGH
RDW: 18.6 — ABNORMAL HIGH
RDW: 24.1 — ABNORMAL HIGH
RDW: 25.1 — ABNORMAL HIGH
WBC: 10.4
WBC: 6.3
WBC: 9

## 2011-08-16 LAB — BASIC METABOLIC PANEL
BUN: 10
BUN: 17
BUN: 27 — ABNORMAL HIGH
BUN: 8
CO2: 22
CO2: 24
CO2: 25
CO2: 27
CO2: 28
Calcium: 8 — ABNORMAL LOW
Calcium: 8.4
Calcium: 8.6
Chloride: 101
Chloride: 103
Chloride: 103
Chloride: 104
Chloride: 105
Chloride: 106
Creatinine, Ser: 0.58
Creatinine, Ser: 0.63
Creatinine, Ser: 1.04
GFR calc Af Amer: >60
GFR calc Af Amer: >60
GFR calc Af Amer: >60
GFR calc non Af Amer: >60
GFR calc non Af Amer: >60
GFR calc non Af Amer: >60
Glucose, Bld: 110 — ABNORMAL HIGH
Glucose, Bld: 117 — ABNORMAL HIGH
Glucose, Bld: 140 — ABNORMAL HIGH
Glucose, Bld: 95
Potassium: 3.2 — ABNORMAL LOW
Potassium: 3.9
Potassium: 4
Potassium: 4.4
Potassium: 5.2 — ABNORMAL HIGH
Sodium: 131 — ABNORMAL LOW
Sodium: 132 — ABNORMAL LOW
Sodium: 134 — ABNORMAL LOW

## 2011-08-16 LAB — CULTURE, ROUTINE-ABSCESS: Gram Stain: NONE SEEN

## 2011-08-16 LAB — BLOOD GAS, ARTERIAL
Acid-base deficit: 2.6 — ABNORMAL HIGH
O2 Saturation: 91.4
Patient temperature: 98.6
TCO2: 18.4

## 2011-08-16 LAB — URINALYSIS, ROUTINE W REFLEX MICROSCOPIC
Ketones, ur: NEGATIVE
Leukocytes, UA: NEGATIVE
Nitrite: NEGATIVE
Specific Gravity, Urine: >1.046 — ABNORMAL HIGH
Urobilinogen, UA: 0.2
pH: 6.5

## 2011-08-16 LAB — CROSSMATCH: ABO/RH(D): O POS

## 2011-08-16 LAB — URINE CULTURE: Culture: NO GROWTH

## 2011-08-16 LAB — CARDIAC PANEL(CRET KIN+CKTOT+MB+TROPI)
CK, MB: 1.6
Relative Index: INVALID

## 2011-08-16 LAB — CULTURE, BLOOD (ROUTINE X 2)

## 2011-08-16 LAB — B-NATRIURETIC PEPTIDE (CONVERTED LAB): Pro B Natriuretic peptide (BNP): 564 — ABNORMAL HIGH

## 2011-08-16 LAB — PROTIME-INR: Prothrombin Time: 18.4 — ABNORMAL HIGH

## 2012-05-27 ENCOUNTER — Inpatient Hospital Stay (HOSPITAL_COMMUNITY)
Admission: EM | Admit: 2012-05-27 | Discharge: 2012-06-02 | DRG: 467 | Disposition: A | Discharge status: Skilled Nursing Facility | Payer: Medicare Other | Attending: Internal Medicine | Admitting: Internal Medicine

## 2012-05-27 ENCOUNTER — Emergency Department (HOSPITAL_COMMUNITY): Payer: Medicare Other

## 2012-05-27 ENCOUNTER — Encounter (HOSPITAL_COMMUNITY): Payer: Self-pay | Admitting: Emergency Medicine

## 2012-05-27 DIAGNOSIS — T84039A Mechanical loosening of unspecified internal prosthetic joint, initial encounter: Secondary | ICD-10-CM | POA: Diagnosis present

## 2012-05-27 DIAGNOSIS — I5032 Chronic diastolic (congestive) heart failure: Secondary | ICD-10-CM

## 2012-05-27 DIAGNOSIS — M899 Disorder of bone, unspecified: Secondary | ICD-10-CM | POA: Diagnosis present

## 2012-05-27 DIAGNOSIS — Z87891 Personal history of nicotine dependence: Secondary | ICD-10-CM

## 2012-05-27 DIAGNOSIS — T84049A Periprosthetic fracture around unspecified internal prosthetic joint, initial encounter: Principal | ICD-10-CM | POA: Diagnosis present

## 2012-05-27 DIAGNOSIS — Y831 Surgical operation with implant of artificial internal device as the cause of abnormal reaction of the patient, or of later complication, without mention of misadventure at the time of the procedure: Secondary | ICD-10-CM | POA: Diagnosis present

## 2012-05-27 DIAGNOSIS — S7290XA Unspecified fracture of unspecified femur, initial encounter for closed fracture: Secondary | ICD-10-CM

## 2012-05-27 DIAGNOSIS — C20 Malignant neoplasm of rectum: Secondary | ICD-10-CM | POA: Diagnosis present

## 2012-05-27 DIAGNOSIS — K922 Gastrointestinal hemorrhage, unspecified: Secondary | ICD-10-CM | POA: Insufficient documentation

## 2012-05-27 DIAGNOSIS — S72002A Fracture of unspecified part of neck of left femur, initial encounter for closed fracture: Secondary | ICD-10-CM | POA: Diagnosis present

## 2012-05-27 DIAGNOSIS — D62 Acute posthemorrhagic anemia: Secondary | ICD-10-CM | POA: Diagnosis not present

## 2012-05-27 DIAGNOSIS — E871 Hypo-osmolality and hyponatremia: Secondary | ICD-10-CM

## 2012-05-27 DIAGNOSIS — Z96649 Presence of unspecified artificial hip joint: Secondary | ICD-10-CM

## 2012-05-27 DIAGNOSIS — J4489 Other specified chronic obstructive pulmonary disease: Secondary | ICD-10-CM | POA: Diagnosis present

## 2012-05-27 DIAGNOSIS — Z681 Body mass index (BMI) 19 or less, adult: Secondary | ICD-10-CM

## 2012-05-27 DIAGNOSIS — I10 Essential (primary) hypertension: Secondary | ICD-10-CM | POA: Diagnosis present

## 2012-05-27 DIAGNOSIS — J449 Chronic obstructive pulmonary disease, unspecified: Secondary | ICD-10-CM

## 2012-05-27 DIAGNOSIS — D509 Iron deficiency anemia, unspecified: Secondary | ICD-10-CM | POA: Insufficient documentation

## 2012-05-27 DIAGNOSIS — Z79899 Other long term (current) drug therapy: Secondary | ICD-10-CM

## 2012-05-27 DIAGNOSIS — Z85048 Personal history of other malignant neoplasm of rectum, rectosigmoid junction, and anus: Secondary | ICD-10-CM

## 2012-05-27 DIAGNOSIS — E440 Moderate protein-calorie malnutrition: Secondary | ICD-10-CM | POA: Diagnosis present

## 2012-05-27 HISTORY — DX: Chronic obstructive pulmonary disease, unspecified: J44.9

## 2012-05-27 HISTORY — DX: Malignant neoplasm of rectum: C20

## 2012-05-27 HISTORY — DX: Gastrointestinal hemorrhage, unspecified: K92.2

## 2012-05-27 LAB — CBC WITH DIFFERENTIAL/PLATELET
Basophils Absolute: 0 10*3/uL (ref 0.0–0.1)
Eosinophils Absolute: 0 10*3/uL (ref 0.0–0.7)
Eosinophils Relative: 0 % (ref 0–5)
HCT: 40.2 % (ref 36.0–46.0)
Lymphocytes Relative: 12 % (ref 12–46)
Lymphs Abs: 0.8 10*3/uL (ref 0.7–4.0)
MCH: 30 pg (ref 26.0–34.0)
MCV: 90.5 fL (ref 78.0–100.0)
Monocytes Absolute: 0.6 10*3/uL (ref 0.1–1.0)
Platelets: 199 10*3/uL (ref 150–400)
RDW: 15 % (ref 11.5–15.5)

## 2012-05-27 LAB — COMPREHENSIVE METABOLIC PANEL
ALT: 12 U/L (ref 0–35)
Albumin: 3.3 g/dL — ABNORMAL LOW (ref 3.5–5.2)
Alkaline Phosphatase: 151 U/L — ABNORMAL HIGH (ref 39–117)
BUN: 8 mg/dL (ref 6–23)
Calcium: 9.2 mg/dL (ref 8.4–10.5)
GFR calc Af Amer: >90 mL/min (ref >90)
Glucose, Bld: 109 mg/dL — ABNORMAL HIGH (ref 70–99)
Potassium: 3.3 mEq/L — ABNORMAL LOW (ref 3.5–5.1)
Sodium: 137 mEq/L (ref 135–145)
Total Protein: 7.1 g/dL (ref 6.0–8.3)

## 2012-05-27 LAB — BASIC METABOLIC PANEL
CO2: 25 mEq/L (ref 19–32)
Calcium: 9.3 mg/dL (ref 8.4–10.5)
Creatinine, Ser: 0.66 mg/dL (ref 0.50–1.10)
GFR calc non Af Amer: 86 mL/min — ABNORMAL LOW (ref >90)
Glucose, Bld: 108 mg/dL — ABNORMAL HIGH (ref 70–99)
Sodium: 139 mEq/L (ref 135–145)

## 2012-05-27 MED ORDER — ENOXAPARIN SODIUM 40 MG/0.4ML ~~LOC~~ SOLN
40.0000 mg | Freq: Every day | SUBCUTANEOUS | Status: DC
  Filled 2012-05-27 (×2): qty 0.4

## 2012-05-27 MED ORDER — CLONIDINE HCL 0.2 MG PO TABS
0.2000 mg | ORAL_TABLET | Freq: Two times a day (BID) | ORAL | Status: DC
  Administered 2012-05-27 – 2012-06-02 (×11): 0.2 mg via ORAL
  Filled 2012-05-27 (×14): qty 1

## 2012-05-27 MED ORDER — FERROUS SULFATE 325 (65 FE) MG PO TABS
325.0000 mg | ORAL_TABLET | Freq: Three times a day (TID) | ORAL | Status: DC
  Administered 2012-05-28 – 2012-06-02 (×15): 325 mg via ORAL
  Filled 2012-05-27 (×21): qty 1

## 2012-05-27 MED ORDER — POTASSIUM CHLORIDE CRYS ER 20 MEQ PO TBCR
40.0000 meq | EXTENDED_RELEASE_TABLET | Freq: Once | ORAL | Status: AC
  Administered 2012-05-27: 40 meq via ORAL
  Filled 2012-05-27: qty 2

## 2012-05-27 MED ORDER — WHITE PETROLATUM GEL
Status: AC
  Administered 2012-05-27: 19:00:00
  Filled 2012-05-27: qty 5

## 2012-05-27 MED ORDER — BUDESONIDE-FORMOTEROL FUMARATE 160-4.5 MCG/ACT IN AERO
2.0000 | INHALATION_SPRAY | Freq: Two times a day (BID) | RESPIRATORY_TRACT | Status: DC
  Administered 2012-05-27 – 2012-06-02 (×10): 2 via RESPIRATORY_TRACT
  Filled 2012-05-27 (×2): qty 6

## 2012-05-27 MED ORDER — POTASSIUM CHLORIDE CRYS ER 20 MEQ PO TBCR
20.0000 meq | EXTENDED_RELEASE_TABLET | Freq: Two times a day (BID) | ORAL | Status: DC
  Administered 2012-05-29 – 2012-06-02 (×10): 20 meq via ORAL
  Filled 2012-05-27 (×13): qty 1

## 2012-05-27 MED ORDER — DOCUSATE SODIUM 100 MG PO CAPS
100.0000 mg | ORAL_CAPSULE | Freq: Two times a day (BID) | ORAL | Status: DC
  Filled 2012-05-27 (×3): qty 1

## 2012-05-27 MED ORDER — ONDANSETRON HCL 4 MG/2ML IJ SOLN
4.0000 mg | Freq: Three times a day (TID) | INTRAMUSCULAR | Status: AC | PRN
  Administered 2012-05-27 (×2): 4 mg via INTRAVENOUS
  Filled 2012-05-27: qty 2

## 2012-05-27 MED ORDER — SENNA 8.6 MG PO TABS
1.0000 | ORAL_TABLET | Freq: Two times a day (BID) | ORAL | Status: DC
  Administered 2012-05-28 – 2012-06-02 (×10): 8.6 mg via ORAL
  Filled 2012-05-27 (×13): qty 1

## 2012-05-27 MED ORDER — HYDRALAZINE HCL 20 MG/ML IJ SOLN
5.0000 mg | Freq: Once | INTRAMUSCULAR | Status: AC
  Administered 2012-05-27: 5 mg via INTRAVENOUS
  Filled 2012-05-27: qty 0.25

## 2012-05-27 MED ORDER — CLONIDINE HCL 0.2 MG PO TABS
0.2000 mg | ORAL_TABLET | Freq: Once | ORAL | Status: AC
  Administered 2012-05-27: 0.2 mg via ORAL
  Filled 2012-05-27: qty 1

## 2012-05-27 MED ORDER — TIOTROPIUM BROMIDE MONOHYDRATE 18 MCG IN CAPS
18.0000 ug | ORAL_CAPSULE | Freq: Every day | RESPIRATORY_TRACT | Status: DC
  Administered 2012-05-28 – 2012-06-02 (×5): 18 ug via RESPIRATORY_TRACT
  Filled 2012-05-27 (×2): qty 5

## 2012-05-27 MED ORDER — OXYCODONE-ACETAMINOPHEN 5-325 MG PO TABS
1.0000 | ORAL_TABLET | ORAL | Status: DC | PRN
Start: 2012-05-27 — End: 2012-05-28
  Administered 2012-05-27 – 2012-05-28 (×3): 1 via ORAL
  Filled 2012-05-27: qty 2
  Filled 2012-05-27 (×2): qty 1

## 2012-05-27 MED ORDER — POTASSIUM CHLORIDE IN NACL 20-0.9 MEQ/L-% IV SOLN
INTRAVENOUS | Status: DC
  Administered 2012-05-27 – 2012-05-29 (×2): via INTRAVENOUS
  Filled 2012-05-27 (×6): qty 1000

## 2012-05-27 MED ORDER — METOPROLOL TARTRATE 25 MG PO TABS
25.0000 mg | ORAL_TABLET | Freq: Two times a day (BID) | ORAL | Status: DC
  Administered 2012-05-27 – 2012-06-02 (×12): 25 mg via ORAL
  Filled 2012-05-27 (×17): qty 1

## 2012-05-27 MED ORDER — ONDANSETRON HCL 4 MG/2ML IJ SOLN
INTRAMUSCULAR | Status: AC
  Administered 2012-05-27: 4 mg
  Filled 2012-05-27: qty 2

## 2012-05-27 MED ORDER — ADULT MULTIVITAMIN W/MINERALS CH
1.0000 | ORAL_TABLET | Freq: Every day | ORAL | Status: DC
  Administered 2012-05-29 – 2012-06-02 (×6): 1 via ORAL
  Filled 2012-05-27 (×8): qty 1

## 2012-05-27 MED ORDER — FAMOTIDINE 40 MG PO TABS
40.0000 mg | ORAL_TABLET | Freq: Every day | ORAL | Status: DC
  Administered 2012-05-27 – 2012-06-01 (×6): 40 mg via ORAL
  Filled 2012-05-27 (×8): qty 1

## 2012-05-27 MED ORDER — HYDROMORPHONE HCL PF 1 MG/ML IJ SOLN
0.5000 mg | INTRAMUSCULAR | Status: AC | PRN
  Administered 2012-05-27: 1 mg via INTRAVENOUS
  Filled 2012-05-27: qty 1

## 2012-05-27 MED ORDER — POLYETHYLENE GLYCOL 3350 17 G PO PACK
17.0000 g | PACK | Freq: Every day | ORAL | Status: DC | PRN
  Filled 2012-05-27: qty 1

## 2012-05-27 MED ORDER — TIOTROPIUM BROMIDE MONOHYDRATE 18 MCG IN CAPS
18.0000 ug | ORAL_CAPSULE | Freq: Every day | RESPIRATORY_TRACT | Status: DC
  Filled 2012-05-27: qty 5

## 2012-05-27 MED ORDER — ONDANSETRON HCL 4 MG/2ML IJ SOLN
INTRAMUSCULAR | Status: AC
  Filled 2012-05-27: qty 2

## 2012-05-27 MED ORDER — BUDESONIDE-FORMOTEROL FUMARATE 160-4.5 MCG/ACT IN AERO
2.0000 | INHALATION_SPRAY | Freq: Two times a day (BID) | RESPIRATORY_TRACT | Status: DC
  Filled 2012-05-27: qty 6

## 2012-05-27 MED ORDER — HYDRALAZINE HCL 10 MG PO TABS
10.0000 mg | ORAL_TABLET | Freq: Three times a day (TID) | ORAL | Status: DC
  Administered 2012-05-27 – 2012-06-02 (×16): 10 mg via ORAL
  Filled 2012-05-27 (×23): qty 1

## 2012-05-27 MED ORDER — FUROSEMIDE 40 MG PO TABS
40.0000 mg | ORAL_TABLET | Freq: Every day | ORAL | Status: DC
  Administered 2012-05-27 – 2012-06-02 (×7): 40 mg via ORAL
  Filled 2012-05-27 (×8): qty 1

## 2012-05-27 NOTE — ED Provider Notes (Signed)
1:32 PM Pt seen and examined by me in CDU. Pt states she got up this morning, was walking and felt a pain in left hip and it started to swell, Not able to bear weight now. Denies fall, no other injuries. Pt was found to have a femur fracture just below the prosthesis. Pt admitted to inpatient. PT states she is doing well, but asking for more pain medication. Left leg swelling noted over left femur, tender. Normal dorsal pedal pulses bilaterally. Normal cap refill in left toes. Will monitor until pt gets bed upstairs. Pt has dilaudid scheduled prn. BP elevated, just got clonidine for it.   2:46 PM Spoke with Dr. Berton Lan, who does not believe pt will be going to OR today, possibly tomorrow. OK to order a diet today. He will see her later afternoon.   Lottie Mussel, PA 05/27/12 1446

## 2012-05-27 NOTE — Progress Notes (Signed)
Orthopedic Tech Progress Note Patient Details:  [name] Apr 03, 1941 [id number]  Patient ID: [name], female   DOB: 08-02-1941, 71 y.o.   MRN: [id number] Trapeze bar patient helper  Nikki Dom 05/27/2012, 4:28 PM

## 2012-05-27 NOTE — Progress Notes (Signed)
Orthopedic Tech Progress Note Patient Details:  [name] April 24, 1941 [id number]  Musculoskeletal Traction Type of Traction: Bucks Skin Traction Traction Location: left leg Traction Weight: 5 lbs    Nikki Dom 05/27/2012, 4:28 PM

## 2012-05-27 NOTE — H&P (Signed)
Hospital Admission Note Date: 05/27/2012  Patient name: [name] Medical record number: [id number] Date of birth: 03-31-1941 Age: 71 y.o. Gender: female PCP: No primary provider on file.  Attending physician: Altha Harm, MD  Chief Complaint: Leg pain  History of Present Illness: Patient presents to the emergency with leg pain that started approximately 2 hours before coming to the ER. She is unable to articulate the quality of the pain or palliative or provocative features. She does state that at the time of the initial pain the intensity was approximately 6-7/10. The patient denies any trauma and cannot identify a mechanism of injury. She stated that she just noted the onset of pain which became excruciating in the left leg and hip. In the emergency room the patient received an x-ray which demonstrated a fracture of the left hip. She denies any trauma, chest pain, nausea, vomiting, diarrhea, dizziness or syncope.  Patient describes her pain as 1/10 at present and states that this has improved since the traction has been applied to the left lower extremity.  Scheduled Meds:   . budesonide-formoterol  2 puff Inhalation BID  . cloNIDine  0.2 mg Oral Once  . cloNIDine  0.2 mg Oral BID  . docusate sodium  100 mg Oral BID  . enoxaparin (LOVENOX) injection  40 mg Subcutaneous Daily  . famotidine  40 mg Oral QHS  . ferrous sulfate  325 mg Oral TID PC  . furosemide  40 mg Oral Daily  . hydrALAZINE  5 mg Intravenous Once  . hydrALAZINE  10 mg Oral TID  . metoprolol tartrate  25 mg Oral BID  . multivitamin with minerals  1 tablet Oral Daily  . ondansetron      . ondansetron      . potassium chloride SA  20 mEq Oral BID  . potassium chloride  40 mEq Oral Once  . senna  1 tablet Oral BID  . tiotropium  18 mcg Inhalation Daily  . white petrolatum      . DISCONTD: budesonide-formoterol  2 puff Inhalation BID  . DISCONTD: tiotropium  18 mcg Inhalation Daily   Continuous Infusions:    . 0.9 % NaCl with KCl 20 mEq / L     PRN Meds:.HYDROmorphone (DILAUDID) injection, ondansetron (ZOFRAN) IV, polyethylene glycol Allergies: Morphine and related and Ciprofloxacin Past Medical History  Diagnosis Date  . COPD (chronic obstructive pulmonary disease)   . Rectal adenocarcinoma   . Lower GI bleed    Past Surgical History  Procedure Date  . Hip arthroplasty   . Colonoscopy   . Rectal surgery    No family history on file. History   Social History  . Marital Status: Widowed    Spouse Name: N/A    Number of Children: N/A  . Years of Education: N/A   Occupational History  . Not on file.   Social History Main Topics  . Smoking status: Not on file  . Smokeless tobacco: Not on file  . Alcohol Use: Not on file  . Drug Use: Not on file  . Sexually Active: Not on file   Other Topics Concern  . Not on file   Social History Narrative  . No narrative on file   Review of Systems: A comprehensive review of systems was negative except as noted in the history of present illness. Physical Exam: No intake or output data in the 24 hours ending 05/27/12 1904 Patient's daughter is present at the bedside. General: Alert, awake,  oriented x3, in no acute distress. Appears undernourished in appearance. However she's in no acute distress HEENT: [place]/AT PEERL, EOMI Neck: Trachea midline,  no masses, no thyromegal,y no JVD, no carotid bruit OROPHARYNX:  Moist, No exudate/ erythema/lesions.  Heart: Regular rate and rhythm, without murmurs, rubs, gallops. Lungs: Clear to auscultation, no wheezing or rhonchi noted.  Abdomen: Soft, nontender, nondistended, positive bowel sounds, no masses no hepatosplenomegaly noted..  Neuro: No focal neurological deficits noted cranial nerves II through XII grossly intact. DTRs 2+ bilaterally upper and lower extremities. Strength functional in bilateral upper extremities. and right lower extremity. Musculoskeletal: Presently in Buck's traction has  been applied to the left lower extremity Psychiatric: Patient alert and oriented x3, good insight and cognition, good recent to remote recall.   Lab results:  [facility] 05/27/12 1536 05/27/12 1143  NA 137 139  K 3.3* 3.0*  CL 102 102  CO2 22 25  GLUCOSE 109* 108*  BUN 8 8  CREATININE 0.57 0.66  CALCIUM 9.2 9.3  MG -- --  PHOS -- --    Basename 05/27/12 1536  AST 33  ALT 12  ALKPHOS 151*  BILITOT 0.5  PROT 7.1  ALBUMIN 3.3*   No results found for this basename: LIPASE:2,AMYLASE:2 in the last 72 hours  Basename 05/27/12 1143  WBC 6.7  NEUTROABS 5.3  HGB 13.3  HCT 40.2  MCV 90.5  PLT 199   No results found for this basename: CKTOTAL:3,CKMB:3,CKMBINDEX:3,TROPONINI:3 in the last 72 hours No components found with this basename: POCBNP:3 No results found for this basename: DDIMER:2 in the last 72 hours No results found for this basename: HGBA1C:2 in the last 72 hours No results found for this basename: CHOL:2,HDL:2,LDLCALC:2,TRIG:2,CHOLHDL:2,LDLDIRECT:2 in the last 72 hours No results found for this basename: TSH,T4TOTAL,FREET3,T3FREE,THYROIDAB in the last 72 hours No results found for this basename: VITAMINB12:2,FOLATE:2,FERRITIN:2,TIBC:2,IRON:2,RETICCTPCT:2 in the last 72 hours Imaging results:  Dg Chest 1 View  05/27/2012  *RADIOLOGY REPORT*  Clinical Data: Left femur fracture.  CHEST - 1 VIEW  Comparison: 06/08/2011.  Findings: The heart remains normal in size.  The lungs remain hyperexpanded with prominent central pulmonary arteries.  Minimal scoliosis.  Diffuse osteopenia.  IMPRESSION: Stable changes of COPD with evidence of pulmonary arterial hypertension.  No acute abnormality.  Original Report Authenticated By: Darrol Angel, M.D.   Dg Hip Complete Left  05/27/2012  *RADIOLOGY REPORT*  Clinical Data: Pain  LEFT HIP - COMPLETE 2+ VIEW  Comparison: No comparison studies available.  Findings: Left total hip replacement noted in this patient with marked diffuse bony  demineralization.  Overlying metallic support device/brace is evident.  Periprosthetic femur fracture is seen on correlative femur films performed at the same time, but is not visualized on this left hip study.  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  IMPRESSION: Status post left total hip replacement.  The femoral component remains located in the acetabular cup.  Femur films performed at the same time as this study demonstrate a periprosthetic femur fracture at the tip of the femoral component.  Original Report Authenticated By: ERIC A. MANSELL, M.D.   Dg Femur Left  05/27/2012  *RADIOLOGY REPORT*  Clinical Data: Left leg pain.  No known injury.  LEFT FEMUR - 2 VIEW  Comparison: Left hip radiographs obtained at the same time.  Findings: Left total hip prosthesis.  Transverse fracture of the femoral shaft at the level of the distal aspect of the femoral component of the prosthesis.  There is a  one third two one half shaft width of lateral displacement of the distal fragment as well as posterior angulation of the distal fragment.  There is periprosthetic lucency in the distal portion of the proximal fragment with a thin anterior cortex along the anterior margin of the distal portion of the prosthetic component.  Diffuse osteopenia is noted as well as arterial calcifications.  IMPRESSION: Left femoral shaft fracture, as described above.  Original Report Authenticated By: Darrol Angel, M.D.   Other results: EKG: normal EKG, normal sinus rhythm, unchanged from previous tracings.   Patient Active Hospital Problem List: Hip fracture, left (05/27/2012)   Assessment: Patient sustained a hip fracture without an identifiable mechanism injury. Correlate orthopedic surgery the patient has had a dislocation of her previous hip replacement and feels that the hardware cause a fracture. However given this patient's history of rectal adenocarcinoma a pathologic fracture cannot be  excluded as a cause of this.    Plan: Refer to orthopedic surgery and the plan is for repair. Please note the patient's perioperative risk is low and orthopedist and procedures surgery without any further testing.  HTN (hypertension) (05/27/2012)   Assessment: Patient did not take her blood pressure medications this morning currently her blood pressure is markedly elevated. She has received one dose of clonidine in the emergency room.    Plan: I will resume her usual medications and monitor her blood pressure.  COPD (chronic obstructive pulmonary disease) (05/27/2012)   Assessment: Currently quite absent    Plan: Resume Symbicort   History of rectal cancer (05/27/2012)   Assessment: Noted     Protein calorie malnutrition (05/27/2012)   Assessment: We'll obtain a nutritional consult   DVT prophylaxis with Lovenox.  Total time spent in evaluation and management of this patient approximately 40 minutes Elsia Lasota A. 05/27/2012, 7:04 PM

## 2012-05-27 NOTE — ED Provider Notes (Signed)
History     CSN: [id number]  Arrival date & time 05/27/12  1103   First MD Initiated Contact with Patient 05/27/12 1104      Chief Complaint  Patient presents with  . Leg Pain    (Consider location/radiation/quality/duration/timing/severity/associated sxs/prior treatment) HPI Comments: Patient presents with complaint of left upper leg pain and deformity that began this morning. Patient denies acute injury such as a fall. She was found by EMS on her couch with a obvious upper leg deformity. Patient was initially in severe pain. Patient was given 150 mcg of fentanyl, 4mg  zofran by EMS and placed in a traction splint. These measures improved her pain significantly. She had good pedal pulses on scene. Patient has a history of a left hip replacement. Daughter states that patiet needed a revision and walked with her leg bowed out at baseline. She was not ambulatory this morning. Patient denies other symptoms such as chest pain, shortness of breath, nausea, vomiting. Onset was acute. Course is constant. Patient does have history of rectal cancer. She does have history of COPD.  Patient is a 71 y.o. female presenting with leg pain. The history is provided by the patient.  Leg Pain  The incident occurred 1 to 2 hours ago. The incident occurred at home. There was no injury mechanism. The pain is present in the left leg. The quality of the pain is described as aching. The pain is moderate. The pain has been constant since onset. Associated symptoms include inability to bear weight. Pertinent negatives include no numbness, no muscle weakness, no loss of sensation and no tingling. The symptoms are aggravated by activity, bearing weight and palpation.    Past Medical History  Diagnosis Date  . COPD (chronic obstructive pulmonary disease)   . Rectal adenocarcinoma   . Lower GI bleed     Past Surgical History  Procedure Date  . Hip arthroplasty   . Colonoscopy   . Rectal surgery     No family  history on file.  History  Substance Use Topics  . Smoking status: Not on file  . Smokeless tobacco: Not on file  . Alcohol Use: Not on file    OB History    No data available      Review of Systems  Constitutional: Negative for fever.  HENT: Negative for sore throat and rhinorrhea.   Eyes: Negative for redness.  Respiratory: Negative for cough.   Cardiovascular: Negative for chest pain.  Gastrointestinal: Negative for nausea, vomiting, abdominal pain and diarrhea.  Genitourinary: Negative for dysuria.  Musculoskeletal: Positive for myalgias and arthralgias. Negative for back pain.  Skin: Negative for rash.  Neurological: Negative for tingling, numbness and headaches.    Allergies  Ciprofloxacin and Morphine and related  Home Medications  No current outpatient prescriptions on file.  There were no vitals taken for this visit.  Physical Exam  Nursing note and vitals reviewed. Constitutional: She appears well-developed and well-nourished. No distress.  HENT:  Head: Normocephalic and atraumatic.  Eyes: Conjunctivae are normal. Pupils are equal, round, and reactive to light. Right eye exhibits no discharge. Left eye exhibits no discharge.  Neck: Normal range of motion. Neck supple.  Cardiovascular: Normal rate, regular rhythm and normal heart sounds.   Pulmonary/Chest: Effort normal and breath sounds normal.  Abdominal: Soft. There is no tenderness.  Musculoskeletal:       Traction splint in place L lower extremity. There is deformity noted mid-left femur. 2+ DP and PT pulses bilaterally. Sensation distal  to deformity is intact. Compartments of leg are soft and non-tender. Cap refill <2s. Patient can wiggle toes without difficulty.   Neurological: She is alert.  Skin: Skin is warm and dry.  Psychiatric: She has a normal mood and affect.    ED Course  Procedures (including critical care time)  Labs Reviewed  CBC WITH DIFFERENTIAL - Abnormal; Notable for the  following:    Neutrophils Relative 79 (*)     All other components within normal limits  BASIC METABOLIC PANEL - Abnormal; Notable for the following:    Potassium 3.0 (*)     Glucose, Bld 108 (*)     GFR calc non Af Amer 86 (*)     All other components within normal limits   Dg Chest 1 View  05/27/2012  *RADIOLOGY REPORT*  Clinical Data: Left femur fracture.  CHEST - 1 VIEW  Comparison: 06/08/2011.  Findings: The heart remains normal in size.  The lungs remain hyperexpanded with prominent central pulmonary arteries.  Minimal scoliosis.  Diffuse osteopenia.  IMPRESSION: Stable changes of COPD with evidence of pulmonary arterial hypertension.  No acute abnormality.  Original Report Authenticated By: Darrol Angel, M.D.   Dg Hip Complete Left  05/27/2012  *RADIOLOGY REPORT*  Clinical Data: Pain  LEFT HIP - COMPLETE 2+ VIEW  Comparison: No comparison studies available.  Findings: Left total hip replacement noted in this patient with marked diffuse bony demineralization.  Overlying metallic support device/brace is evident.  Periprosthetic femur fracture is seen on correlative femur films performed at the same time, but is not visualized on this left hip study.  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  IMPRESSION: Status post left total hip replacement.  The femoral component remains located in the acetabular cup.  Femur films performed at the same time as this study demonstrate a periprosthetic femur fracture at the tip of the femoral component.  Original Report Authenticated By: ERIC A. MANSELL, M.D.   Dg Femur Left  05/27/2012  *RADIOLOGY REPORT*  Clinical Data: Left leg pain.  No known injury.  LEFT FEMUR - 2 VIEW  Comparison: Left hip radiographs obtained at the same time.  Findings: Left total hip prosthesis.  Transverse fracture of the femoral shaft at the level of the distal aspect of the femoral component of the prosthesis.  There is a one third two one half shaft  width of lateral displacement of the distal fragment as well as posterior angulation of the distal fragment.  There is periprosthetic lucency in the distal portion of the proximal fragment with a thin anterior cortex along the anterior margin of the distal portion of the prosthetic component.  Diffuse osteopenia is noted as well as arterial calcifications.  IMPRESSION: Left femoral shaft fracture, as described above.  Original Report Authenticated By: Darrol Angel, M.D.     1. Femur fracture   2. Hypertension     11:13 AM Patient seen and examined. Work-up initiated. D/w and seen by Dr. Anitra Lauth.   Vital signs reviewed and are as follows: Filed Vitals:   05/27/12 1326  BP: 200/99  Pulse: 83  Temp: 98.4 F (36.9 C)  Resp: 17    Date: 05/27/2012  Rate: 78  Rhythm: normal sinus rhythm  QRS Axis: normal  Intervals: normal  ST/T Wave abnormalities: nonspecific ST/T changes  Conduction Disutrbances:none  Narrative Interpretation:   Old EKG Reviewed: unchanged from 06/05/11, ST/T abnormalities appear old  I spoke with Dr. Lequita Halt of [facility] ortho --  informed of patient and he will see. Asked for hospitalist to admit.  Dr. Ashley Royalty, [facility] hospitalist to admit. Patient remains hypertensive -- clonidine ordered.   Family and patient informed of plan.   MDM  Femur fx -- admit  HTN -- will monitor, home clonidine ordered. There is no evidence of end-organ damage from HTN. Patient appears well and is stable.         Renne Crigler, [place] 05/27/12 [phone]

## 2012-05-27 NOTE — Progress Notes (Signed)
Patient [name]. Csaszar, 71 year old white female enjoys the emotional support of her daughter [name].  Patient and her daughter Liana Gerold for providing pastoral presence, prayer, and conversation.  I will follow up as needed.

## 2012-05-27 NOTE — Consult Note (Signed)
Reason for Consult: Left periprosthetic hip fracture Referring Physician: Dr. Karrie Meres, MD  [name] is an 71 y.o. female.  HPI: [name] presents with the chief complaint of left leg pain. About two weeks ago she started having some pain in the knee with no known injury. She did not have any swelling in the knee. Julia daughter (who lives next door and helps take care of Julia) decided to wait and see how the knee did before going to the doctor. It became increasingly difficult for Julia to walk over the next few days until recently when she has been very sedentary. She typically walks with a cane with no difficulty. Julia daughter does report that for years [name] would "bow the hip out" when walking. This morning she was going to the restroom when she began to have much more pain and instability in the left leg. She called Julia son and daughter to help Julia. Julia daughter immediately called EMS as the left thigh was very swollen as it had not been before. She was transported to the ED at [facility].   Past Medical History  Diagnosis Date  . COPD (chronic obstructive pulmonary disease)   . Rectal adenocarcinoma   . Lower GI bleed     Past Surgical History  Procedure Date  . Hip arthroplasty   . Colonoscopy   . Rectal surgery     Social History:  does not have a smoking history on file. She does not have any smokeless tobacco history on file. Julia alcohol and drug histories not on file.  Allergies:  Allergies  Allergen Reactions  . Morphine And Related Other (See Comments)    "makes me crazy"  . Ciprofloxacin Hives and Rash    Medications: I have reviewed the patient's current medications.  Results for orders placed during the hospital encounter of 05/27/12 (from the past 48 hour(s))  CBC WITH DIFFERENTIAL     Status: Abnormal   Collection Time   05/27/12 11:43 AM      Component Value Range Comment   WBC 6.7  4.0 - 10.5 K/uL    RBC 4.44  3.87 - 5.11 MIL/uL    Hemoglobin 13.3   12.0 - 15.0 g/dL    HCT 57.8  46.9 - 62.9 %    MCV 90.5  78.0 - 100.0 fL    MCH 30.0  26.0 - 34.0 pg    MCHC 33.1  30.0 - 36.0 g/dL    RDW 52.8  41.3 - 24.4 %    Platelets 199  150 - 400 K/uL    Neutrophils Relative 79 (*) 43 - 77 %    Neutro Abs 5.3  1.7 - 7.7 K/uL    Lymphocytes Relative 12  12 - 46 %    Lymphs Abs 0.8  0.7 - 4.0 K/uL    Monocytes Relative 9  3 - 12 %    Monocytes Absolute 0.6  0.1 - 1.0 K/uL    Eosinophils Relative 0  0 - 5 %    Eosinophils Absolute 0.0  0.0 - 0.7 K/uL    Basophils Relative 0  0 - 1 %    Basophils Absolute 0.0  0.0 - 0.1 K/uL   BASIC METABOLIC PANEL     Status: Abnormal   Collection Time   05/27/12 11:43 AM      Component Value Range Comment   Sodium 139  135 - 145 mEq/L    Potassium 3.0 (*) 3.5 - 5.1  mEq/L    Chloride 102  96 - 112 mEq/L    CO2 25  19 - 32 mEq/L    Glucose, Bld 108 (*) 70 - 99 mg/dL    BUN 8  6 - 23 mg/dL    Creatinine, Ser 4.09  0.50 - 1.10 mg/dL    Calcium 9.3  8.4 - 81.1 mg/dL    GFR calc non Af Amer 86 (*) >90 mL/min    GFR calc Af Amer >90  >90 mL/min   COMPREHENSIVE METABOLIC PANEL     Status: Abnormal   Collection Time   05/27/12  3:36 PM      Component Value Range Comment   Sodium 137  135 - 145 mEq/L    Potassium 3.3 (*) 3.5 - 5.1 mEq/L    Chloride 102  96 - 112 mEq/L    CO2 22  19 - 32 mEq/L    Glucose, Bld 109 (*) 70 - 99 mg/dL    BUN 8  6 - 23 mg/dL    Creatinine, Ser 9.14  0.50 - 1.10 mg/dL    Calcium 9.2  8.4 - 78.2 mg/dL    Total Protein 7.1  6.0 - 8.3 g/dL    Albumin 3.3 (*) 3.5 - 5.2 g/dL    AST 33  0 - 37 U/L    ALT 12  0 - 35 U/L    Alkaline Phosphatase 151 (*) 39 - 117 U/L    Total Bilirubin 0.5  0.3 - 1.2 mg/dL    GFR calc non Af Amer >90  >90 mL/min    GFR calc Af Amer >90  >90 mL/min     Dg Chest 1 View  05/27/2012  *RADIOLOGY REPORT*  Clinical Data: Left femur fracture.  CHEST - 1 VIEW  Comparison: 06/08/2011.  Findings: The heart remains normal in size.  The lungs remain hyperexpanded  with prominent central pulmonary arteries.  Minimal scoliosis.  Diffuse osteopenia.  IMPRESSION: Stable changes of COPD with evidence of pulmonary arterial hypertension.  No acute abnormality.  Original Report Authenticated By: Darrol Angel, M.D.   Dg Hip Complete Left  05/27/2012  *RADIOLOGY REPORT*  Clinical Data: Pain  LEFT HIP - COMPLETE 2+ VIEW  Comparison: No comparison studies available.  Findings: Left total hip replacement noted in this patient with marked diffuse bony demineralization.  Overlying metallic support device/brace is evident.  Periprosthetic femur fracture is seen on correlative femur films performed at the same time, but is not visualized on this left hip study.  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  IMPRESSION: Status post left total hip replacement.  The femoral component remains located in the acetabular cup.  Femur films performed at the same time as this study demonstrate a periprosthetic femur fracture at the tip of the femoral component.  Original Report Authenticated By: ERIC A. MANSELL, M.D.   Dg Femur Left  05/27/2012  *RADIOLOGY REPORT*  Clinical Data: Left leg pain.  No known injury.  LEFT FEMUR - 2 VIEW  Comparison: Left hip radiographs obtained at the same time.  Findings: Left total hip prosthesis.  Transverse fracture of the femoral shaft at the level of the distal aspect of the femoral component of the prosthesis.  There is a one third two one half shaft width of lateral displacement of the distal fragment as well as posterior angulation of the distal fragment.  There is periprosthetic lucency in the distal portion of the proximal fragment with a  thin anterior cortex along the anterior margin of the distal portion of the prosthetic component.  Diffuse osteopenia is noted as well as arterial calcifications.  IMPRESSION: Left femoral shaft fracture, as described above.  Original Report Authenticated By: Darrol Angel, M.D.     Review of Systems  Constitutional: Positive for chills and malaise/fatigue. Negative for fever and diaphoresis.  HENT: Negative.  Negative for neck pain.   Eyes: Negative.   Respiratory: Negative.   Cardiovascular: Negative.   Gastrointestinal: Positive for nausea and vomiting. Negative for heartburn, abdominal pain, diarrhea and constipation.  Genitourinary: Negative.   Musculoskeletal: Positive for myalgias and joint pain. Negative for back pain and falls.  Skin: Negative.   Neurological: Negative.  Negative for weakness.  Psychiatric/Behavioral: Negative.    Blood pressure 180/70, pulse 124, temperature 98.3 F (36.8 C), temperature source Oral, resp. rate 24, SpO2 96.00%. Physical Exam  Constitutional: She is oriented to person, place, and time. She appears well-developed and well-nourished. No distress.  Cardiovascular: Intact distal pulses.   Musculoskeletal:       Right hip: Normal.       Left hip: She exhibits decreased range of motion, decreased strength and tenderness.       Right knee: Normal.       Left knee: Normal. She exhibits normal range of motion.       Right ankle: Normal.       Left ankle: Normal.       Legs: Neurological: She is alert and oriented to person, place, and time. No sensory deficit.       Strength intact in right lower extremity. Decreased strength in the left lower extremity due to pain.  Skin: Skin is warm. She is not diaphoretic. No erythema.  Psychiatric: She has a normal mood and affect. Julia behavior is normal.    Assessment/Plan: Left hip, periprosthetic fracture This fracture is going to require surgical intervention. Dr. Lequita Halt will see the patient this evening and discuss surgical options with the family. Most likely the patient will be transferred to [facility] where surgery can be performed tomorrow. For the time being she can eat and drink, but will be NPO after midnight in preparation for surgery tomorrow. Patient to remain in  Buck's traction at the present time.    CONSTABLE, AMBER LAUREN 05/27/2012, 8:37 PM   I have seen and examined the patient and agree with the above assessment and findings. Ms. Wonder has a very complex periprosthetic femur fracture in the setting of a grossly loose femoral component which has migrated into varus eroding away an area on the lateral cortex of the femur, predisposing Julia to the current injury. This will require a fairly complex reconstructive procedure involving removal of the prosthesis, fixation of the fracture and placement of a new longer femoral component to bypass the fracture. I have informed the patient of the procedure, risks and potential complications and she would like to proceed. I am operating all day at [facility] and can add Julia there tomorrow afternoon with a reserved time. Will speak to medical team in AM about transferring Julia there to do the operation tomorrow PM.  Gus Rankin. Codi Kertz, MD    05/27/2012, 10:54 PM

## 2012-05-27 NOTE — ED Provider Notes (Signed)
Medical screening examination/treatment/procedure(s) were conducted as a shared visit with non-physician practitioner(s) and myself.  I personally evaluated the patient during the encounter   Gwyneth Sprout, MD 05/27/12 1515

## 2012-05-28 ENCOUNTER — Encounter (HOSPITAL_COMMUNITY)
Admission: EM | Disposition: A | Discharge status: Skilled Nursing Facility | Payer: Self-pay | Source: Home / Self Care | Attending: Internal Medicine

## 2012-05-28 ENCOUNTER — Inpatient Hospital Stay (HOSPITAL_COMMUNITY): Payer: Medicare Other | Admitting: Anesthesiology

## 2012-05-28 ENCOUNTER — Inpatient Hospital Stay (HOSPITAL_COMMUNITY): Payer: Medicare Other

## 2012-05-28 ENCOUNTER — Encounter (HOSPITAL_COMMUNITY): Payer: Self-pay | Admitting: Anesthesiology

## 2012-05-28 ENCOUNTER — Encounter (HOSPITAL_COMMUNITY): Payer: Self-pay

## 2012-05-28 HISTORY — PX: ORIF PERIPROSTHETIC FRACTURE: SHX5034

## 2012-05-28 HISTORY — PX: TOTAL HIP REVISION: SHX763

## 2012-05-28 LAB — CBC
HCT: 35.8 % — ABNORMAL LOW (ref 36.0–46.0)
Hemoglobin: 11.7 g/dL — ABNORMAL LOW (ref 12.0–15.0)
MCV: 92.5 fL (ref 78.0–100.0)
WBC: 6.4 10*3/uL (ref 4.0–10.5)

## 2012-05-28 SURGERY — OPEN REDUCTION INTERNAL FIXATION (ORIF) PERIPROSTHETIC FRACTURE
Anesthesia: General | Site: Hip | Laterality: Left | Wound class: Clean

## 2012-05-28 MED ORDER — ACETAMINOPHEN 325 MG PO TABS: 650.0000 mg | ORAL_TABLET | Freq: Four times a day (QID) | ORAL | Status: DC | PRN

## 2012-05-28 MED ORDER — LACTATED RINGERS IV SOLN: INTRAVENOUS | Status: DC

## 2012-05-28 MED ORDER — SUCCINYLCHOLINE CHLORIDE 20 MG/ML IJ SOLN
INTRAMUSCULAR | Status: DC | PRN
  Administered 2012-05-28: 100 mg via INTRAVENOUS

## 2012-05-28 MED ORDER — PROMETHAZINE HCL 25 MG/ML IJ SOLN: 6.2500 mg | INTRAMUSCULAR | Status: DC | PRN

## 2012-05-28 MED ORDER — CEFAZOLIN SODIUM 1-5 GM-% IV SOLN
INTRAVENOUS | Status: DC | PRN
  Administered 2012-05-28: 2 g via INTRAVENOUS

## 2012-05-28 MED ORDER — SCOPOLAMINE 1 MG/3DAYS TD PT72
MEDICATED_PATCH | TRANSDERMAL | Status: DC | PRN
  Administered 2012-05-28: 1 via TRANSDERMAL

## 2012-05-28 MED ORDER — METOCLOPRAMIDE HCL 5 MG/ML IJ SOLN
INTRAMUSCULAR | Status: DC | PRN
  Administered 2012-05-28: 5 mg via INTRAVENOUS

## 2012-05-28 MED ORDER — METOCLOPRAMIDE HCL 10 MG PO TABS: 5.0000 mg | ORAL_TABLET | Freq: Three times a day (TID) | ORAL | Status: DC | PRN

## 2012-05-28 MED ORDER — ONDANSETRON HCL 4 MG PO TABS
4.0000 mg | ORAL_TABLET | Freq: Four times a day (QID) | ORAL | Status: DC | PRN
  Administered 2012-06-02: 4 mg via ORAL
  Filled 2012-05-28: qty 1

## 2012-05-28 MED ORDER — FENTANYL CITRATE 0.05 MG/ML IJ SOLN: 25.0000 ug | INTRAMUSCULAR | Status: DC | PRN

## 2012-05-28 MED ORDER — LACTATED RINGERS IV SOLN
INTRAVENOUS | Status: DC | PRN
  Administered 2012-05-28: 19:00:00 via INTRAVENOUS

## 2012-05-28 MED ORDER — PROPOFOL 10 MG/ML IV EMUL
INTRAVENOUS | Status: DC | PRN
  Administered 2012-05-28: 140 mg via INTRAVENOUS

## 2012-05-28 MED ORDER — ACETAMINOPHEN 650 MG RE SUPP: 650.0000 mg | Freq: Four times a day (QID) | RECTAL | Status: DC | PRN

## 2012-05-28 MED ORDER — CEFAZOLIN SODIUM 1-5 GM-% IV SOLN
1.0000 g | Freq: Four times a day (QID) | INTRAVENOUS | Status: AC
  Administered 2012-05-29 (×2): 1 g via INTRAVENOUS
  Filled 2012-05-28 (×3): qty 50

## 2012-05-28 MED ORDER — DIPHENHYDRAMINE HCL 12.5 MG/5ML PO ELIX: 12.5000 mg | ORAL_SOLUTION | ORAL | Status: DC | PRN

## 2012-05-28 MED ORDER — LACTATED RINGERS IV SOLN
INTRAVENOUS | Status: DC | PRN
  Administered 2012-05-28 (×3): via INTRAVENOUS

## 2012-05-28 MED ORDER — SCOPOLAMINE 1 MG/3DAYS TD PT72
1.0000 | MEDICATED_PATCH | TRANSDERMAL | Status: DC
  Administered 2012-05-28: 1.5 mg via TRANSDERMAL
  Filled 2012-05-28: qty 1

## 2012-05-28 MED ORDER — DOCUSATE SODIUM 100 MG PO CAPS
100.0000 mg | ORAL_CAPSULE | Freq: Two times a day (BID) | ORAL | Status: DC
  Administered 2012-05-28 – 2012-06-02 (×10): 100 mg via ORAL
  Filled 2012-05-28 (×11): qty 1

## 2012-05-28 MED ORDER — METHOCARBAMOL 500 MG PO TABS
500.0000 mg | ORAL_TABLET | Freq: Four times a day (QID) | ORAL | Status: DC | PRN
  Administered 2012-05-31: 500 mg via ORAL
  Filled 2012-05-28: qty 1

## 2012-05-28 MED ORDER — CEFAZOLIN SODIUM-DEXTROSE 2-3 GM-% IV SOLR
INTRAVENOUS | Status: AC
  Filled 2012-05-28: qty 50

## 2012-05-28 MED ORDER — LABETALOL HCL 5 MG/ML IV SOLN
INTRAVENOUS | Status: AC
  Administered 2012-05-28: 5 mg
  Filled 2012-05-28: qty 4

## 2012-05-28 MED ORDER — LABETALOL HCL 5 MG/ML IV SOLN
INTRAVENOUS | Status: DC | PRN
  Administered 2012-05-28: 5 mg via INTRAVENOUS

## 2012-05-28 MED ORDER — ONDANSETRON HCL 4 MG/2ML IJ SOLN
INTRAMUSCULAR | Status: DC | PRN
  Administered 2012-05-28: 4 mg via INTRAVENOUS

## 2012-05-28 MED ORDER — PHENYLEPHRINE HCL 10 MG/ML IJ SOLN
INTRAMUSCULAR | Status: DC | PRN
  Administered 2012-05-28: 20 ug via INTRAVENOUS
  Administered 2012-05-28 (×2): 10 ug via INTRAVENOUS

## 2012-05-28 MED ORDER — DEXAMETHASONE SODIUM PHOSPHATE 10 MG/ML IJ SOLN
INTRAMUSCULAR | Status: DC | PRN
  Administered 2012-05-28: 10 mg via INTRAVENOUS

## 2012-05-28 MED ORDER — ONDANSETRON HCL 4 MG/2ML IJ SOLN
4.0000 mg | Freq: Once | INTRAMUSCULAR | Status: AC
  Administered 2012-05-28: 4 mg via INTRAVENOUS

## 2012-05-28 MED ORDER — PHENOL 1.4 % MT LIQD: 1.0000 | OROMUCOSAL | Status: DC | PRN

## 2012-05-28 MED ORDER — FENTANYL CITRATE 0.05 MG/ML IJ SOLN
INTRAMUSCULAR | Status: DC | PRN
  Administered 2012-05-28 (×5): 50 ug via INTRAVENOUS

## 2012-05-28 MED ORDER — METOCLOPRAMIDE HCL 5 MG/ML IJ SOLN: 5.0000 mg | Freq: Three times a day (TID) | INTRAMUSCULAR | Status: DC | PRN

## 2012-05-28 MED ORDER — FLEET ENEMA 7-19 GM/118ML RE ENEM: 1.0000 | ENEMA | Freq: Once | RECTAL | Status: AC | PRN

## 2012-05-28 MED ORDER — LIDOCAINE HCL (CARDIAC) 20 MG/ML IV SOLN
INTRAVENOUS | Status: DC | PRN
  Administered 2012-05-28: 50 mg via INTRAVENOUS

## 2012-05-28 MED ORDER — CISATRACURIUM BESYLATE (PF) 10 MG/5ML IV SOLN
INTRAVENOUS | Status: DC | PRN
  Administered 2012-05-28: 8 mg via INTRAVENOUS
  Administered 2012-05-28: 2 mg via INTRAVENOUS

## 2012-05-28 MED ORDER — OXYCODONE HCL 5 MG PO TABS
5.0000 mg | ORAL_TABLET | ORAL | Status: DC | PRN
  Administered 2012-05-31 – 2012-06-01 (×2): 5 mg via ORAL
  Filled 2012-05-28 (×3): qty 1

## 2012-05-28 MED ORDER — HYDRALAZINE HCL 20 MG/ML IJ SOLN
10.0000 mg | Freq: Once | INTRAMUSCULAR | Status: AC
  Administered 2012-05-28: 10 mg via INTRAVENOUS

## 2012-05-28 MED ORDER — ONDANSETRON HCL 4 MG/2ML IJ SOLN
4.0000 mg | Freq: Four times a day (QID) | INTRAMUSCULAR | Status: DC | PRN
  Administered 2012-05-30: 4 mg via INTRAVENOUS
  Filled 2012-05-28: qty 2

## 2012-05-28 MED ORDER — 0.9 % SODIUM CHLORIDE (POUR BTL) OPTIME
TOPICAL | Status: DC | PRN
  Administered 2012-05-28: 1000 mL

## 2012-05-28 MED ORDER — BISACODYL 10 MG RE SUPP: 10.0000 mg | Freq: Every day | RECTAL | Status: DC | PRN

## 2012-05-28 MED ORDER — METHOCARBAMOL 100 MG/ML IJ SOLN
500.0000 mg | Freq: Four times a day (QID) | INTRAVENOUS | Status: DC | PRN
  Filled 2012-05-28: qty 5

## 2012-05-28 MED ORDER — ENOXAPARIN SODIUM 30 MG/0.3ML ~~LOC~~ SOLN
30.0000 mg | SUBCUTANEOUS | Status: DC
  Administered 2012-05-29 – 2012-06-02 (×5): 30 mg via SUBCUTANEOUS
  Filled 2012-05-28 (×5): qty 0.3

## 2012-05-28 MED ORDER — MIDAZOLAM HCL 5 MG/5ML IJ SOLN
INTRAMUSCULAR | Status: DC | PRN
  Administered 2012-05-28: .25 mg via INTRAVENOUS

## 2012-05-28 MED ORDER — MENTHOL 3 MG MT LOZG: 1.0000 | LOZENGE | OROMUCOSAL | Status: DC | PRN

## 2012-05-28 MED ORDER — TRAMADOL HCL 50 MG PO TABS
50.0000 mg | ORAL_TABLET | Freq: Four times a day (QID) | ORAL | Status: DC | PRN
  Administered 2012-05-30: 50 mg via ORAL
  Filled 2012-05-28: qty 1

## 2012-05-28 MED ORDER — ACETAMINOPHEN 10 MG/ML IV SOLN
INTRAVENOUS | Status: AC
  Filled 2012-05-28: qty 100

## 2012-05-28 MED ORDER — LABETALOL HCL 5 MG/ML IV SOLN
5.0000 mg | INTRAVENOUS | Status: DC | PRN
  Filled 2012-05-28: qty 4

## 2012-05-28 MED ORDER — ACETAMINOPHEN 10 MG/ML IV SOLN
1000.0000 mg | Freq: Four times a day (QID) | INTRAVENOUS | Status: DC
  Administered 2012-05-28 – 2012-05-29 (×3): 1000 mg via INTRAVENOUS
  Filled 2012-05-28 (×4): qty 100

## 2012-05-28 MED ORDER — POLYETHYLENE GLYCOL 3350 17 G PO PACK: 17.0000 g | PACK | Freq: Every day | ORAL | Status: DC | PRN

## 2012-05-28 MED ORDER — ACETAMINOPHEN 10 MG/ML IV SOLN
INTRAVENOUS | Status: DC | PRN
  Administered 2012-05-28: 1000 mg via INTRAVENOUS

## 2012-05-28 MED ORDER — HYDRALAZINE HCL 20 MG/ML IJ SOLN
INTRAMUSCULAR | Status: AC
  Filled 2012-05-28: qty 1

## 2012-05-28 SURGICAL SUPPLY — 85 items
BAG SPEC THK2 15X12 ZIP CLS (MISCELLANEOUS) ×3
BAG ZIPLOCK 12X15 (MISCELLANEOUS) ×6 IMPLANT
BIT DRILL 2.8X128 (BIT) ×2 IMPLANT
BLADE EXTENDED COATED 6.5IN (ELECTRODE) ×2 IMPLANT
BLADE SAW SAG 73X25 THK (BLADE) ×1
BLADE SAW SGTL 73X25 THK (BLADE) ×1 IMPLANT
CABLE FOR BONE PLATE (Cable) ×5 IMPLANT
CATH KIT ON-Q SILVERSOAK 5 (CATHETERS) ×1 IMPLANT
CATH KIT ON-Q SILVERSOAK 5IN (CATHETERS) IMPLANT
CLOTH BEACON ORANGE TIMEOUT ST (SAFETY) ×2 IMPLANT
CONT SPECI 4OZ STER CLIK (MISCELLANEOUS) IMPLANT
DRAPE INCISE IOBAN 66X45 STRL (DRAPES) ×2 IMPLANT
DRAPE ORTHO SPLIT 77X108 STRL (DRAPES) ×4
DRAPE POUCH INSTRU U-SHP 10X18 (DRAPES) ×2 IMPLANT
DRAPE SURG 17X11 SM STRL (DRAPES) ×2 IMPLANT
DRAPE SURG ORHT 6 SPLT 77X108 (DRAPES) ×2 IMPLANT
DRAPE U-SHAPE 47X51 STRL (DRAPES) ×2 IMPLANT
DRSG EMULSION OIL 3X16 NADH (GAUZE/BANDAGES/DRESSINGS) ×2 IMPLANT
DRSG MEPILEX BORDER 4X12 (GAUZE/BANDAGES/DRESSINGS) ×1 IMPLANT
DRSG MEPILEX BORDER 4X4 (GAUZE/BANDAGES/DRESSINGS) ×4 IMPLANT
DRSG MEPILEX BORDER 4X8 (GAUZE/BANDAGES/DRESSINGS) ×2 IMPLANT
DRSG PAD ABDOMINAL 8X10 ST (GAUZE/BANDAGES/DRESSINGS) ×4 IMPLANT
DRSG TEGADERM 4X4.75 (GAUZE/BANDAGES/DRESSINGS) ×1 IMPLANT
DURAPREP 26ML APPLICATOR (WOUND CARE) ×2 IMPLANT
ELECT BLADE TIP CTD 4 INCH (ELECTRODE) ×2 IMPLANT
ELECT REM PT RETURN 9FT ADLT (ELECTROSURGICAL) ×2
ELECTRODE REM PT RTRN 9FT ADLT (ELECTROSURGICAL) ×1 IMPLANT
EVACUATOR 1/8 PVC DRAIN (DRAIN) ×2 IMPLANT
FACESHIELD LNG OPTICON STERILE (SAFETY) ×8 IMPLANT
GLOVE BIO SURGEON STRL SZ7.5 (GLOVE) ×1 IMPLANT
GLOVE BIO SURGEON STRL SZ8 (GLOVE) ×2 IMPLANT
GLOVE BIOGEL PI IND STRL 7.0 (GLOVE) IMPLANT
GLOVE BIOGEL PI IND STRL 8 (GLOVE) ×2 IMPLANT
GLOVE BIOGEL PI INDICATOR 7.0 (GLOVE) ×1
GLOVE BIOGEL PI INDICATOR 8 (GLOVE) ×2
GLOVE ECLIPSE 8.0 STRL XLNG CF (GLOVE) ×2 IMPLANT
GLOVE ORTHO TXT STRL SZ7.5 (GLOVE) ×1 IMPLANT
GLOVE SURG SS PI 7.0 STRL IVOR (GLOVE) ×1 IMPLANT
GLOVE SURG SS PI 8.5 STRL IVOR (GLOVE) ×2
GLOVE SURG SS PI 8.5 STRL STRW (GLOVE) IMPLANT
GOWN SRG XL XLNG 56XLVL 4 (GOWN DISPOSABLE) IMPLANT
GOWN STRL NON-REIN LRG LVL3 (GOWN DISPOSABLE) ×3 IMPLANT
GOWN STRL NON-REIN XL XLG LVL4 (GOWN DISPOSABLE) ×4
GOWN STRL REIN XL XLG (GOWN DISPOSABLE) ×2 IMPLANT
GUIDEWIRE BALL NOSE 80CM (WIRE) ×1 IMPLANT
HEAD FEMORAL SROM 32 PLUS 0 (Hips) IMPLANT
IMMOBILIZER KNEE 20 (SOFTGOODS) ×2
IMMOBILIZER KNEE 20 THIGH 36 (SOFTGOODS) IMPLANT
INSERT ACTB 10D 50X32XSTRL LF (Orthopedic Implant) IMPLANT
INSERT OSTEO CFIRE ACET (Orthopedic Implant) ×2 IMPLANT
INSRT ACTB 10D 50X32XSTRL LF (Orthopedic Implant) ×1 IMPLANT
KIT BASIN OR (CUSTOM PROCEDURE TRAY) ×2 IMPLANT
MANIFOLD NEPTUNE II (INSTRUMENTS) ×2 IMPLANT
NDL SAFETY ECLIPSE 18X1.5 (NEEDLE) IMPLANT
NEEDLE HYPO 18GX1.5 SHARP (NEEDLE)
NS IRRIG 1000ML POUR BTL (IV SOLUTION) ×3 IMPLANT
PACK TOTAL JOINT (CUSTOM PROCEDURE TRAY) ×2 IMPLANT
PASSER SUT SWANSON 36MM LOOP (INSTRUMENTS) ×2 IMPLANT
PLATE CABLE BONE 6HOLE (Plate) ×1 IMPLANT
POSITIONER SURGICAL ARM (MISCELLANEOUS) ×2 IMPLANT
SLEEVE FEM PROX 20F XXL (Hips) ×1 IMPLANT
SPONGE DRAIN TRACH 4X4 STRL 2S (GAUZE/BANDAGES/DRESSINGS) ×1 IMPLANT
SPONGE GAUZE 4X4 12PLY (GAUZE/BANDAGES/DRESSINGS) ×4 IMPLANT
SPONGE LAP 18X18 X RAY DECT (DISPOSABLE) ×2 IMPLANT
SPONGE LAP 4X18 X RAY DECT (DISPOSABLE) ×1 IMPLANT
SROM FEMORAL HEAD 32 PLUS 0 (Hips) ×2 IMPLANT
SROM STR LONG 20X15 36 LT (Stem) ×1 IMPLANT
STAPLER VISISTAT 35W (STAPLE) ×3 IMPLANT
STEM FEM MOD STD 36 11X16X150 (Hips) ×1 IMPLANT
STRIP CLOSURE SKIN 1/2X4 (GAUZE/BANDAGES/DRESSINGS) IMPLANT
SUCTION FRAZIER TIP 10 FR DISP (SUCTIONS) ×2 IMPLANT
SUT ETHIBOND NAB CT1 #1 30IN (SUTURE) ×4 IMPLANT
SUT MNCRL AB 4-0 PS2 18 (SUTURE) IMPLANT
SUT VIC AB 1 CT1 27 (SUTURE) ×6
SUT VIC AB 1 CT1 27XBRD ANTBC (SUTURE) ×3 IMPLANT
SUT VIC AB 2-0 CT1 27 (SUTURE) ×8
SUT VIC AB 2-0 CT1 TAPERPNT 27 (SUTURE) ×3 IMPLANT
SUT VLOC 180 0 24IN GS25 (SUTURE) ×4 IMPLANT
SWAB COLLECTION DEVICE MRSA (MISCELLANEOUS) ×2 IMPLANT
SYR 50ML LL SCALE MARK (SYRINGE) IMPLANT
TOWEL OR 17X26 10 PK STRL BLUE (TOWEL DISPOSABLE) ×4 IMPLANT
TRAY FOLEY CATH 14FRSI W/METER (CATHETERS) ×1 IMPLANT
TUBE ANAEROBIC SPECIMEN COL (MISCELLANEOUS) IMPLANT
WATER STERILE IRR 1500ML POUR (IV SOLUTION) ×2 IMPLANT
YANKAUER SUCT BULB TIP NO VENT (SUCTIONS) ×1 IMPLANT

## 2012-05-28 NOTE — Transfer of Care (Signed)
Immediate Anesthesia Transfer of Care Note  Patient: [name]  Procedure(s) Performed: Procedure(s) (LRB): OPEN REDUCTION INTERNAL FIXATION (ORIF) PERIPROSTHETIC FRACTURE (Left) TOTAL HIP REVISION (Left)  Patient Location: PACU  Anesthesia Type: General  Level of Consciousness: awake, alert , oriented and patient cooperative  Airway & Oxygen Therapy: Patient Spontanous Breathing and Patient connected to face mask oxygen  Post-op Assessment: Report given to PACU RN, Post -op Vital signs reviewed and stable and Patient moving all extremities  Post vital signs: Reviewed and stable  Complications: No apparent anesthesia complications

## 2012-05-28 NOTE — Brief Op Note (Signed)
05/27/2012 - 05/28/2012  9:09 PM  PATIENT:  [name]  71 y.o. female  PRE-OPERATIVE DIAGNOSIS: Left periprosthetic femur fracture POST-OPERATIVE DIAGNOSIS:   Left periprosthetic femur fracture  PROCEDURE:  Procedure(s) (LRB): OPEN REDUCTION INTERNAL FIXATION (ORIF) PERIPROSTHETIC FRACTURE (Left) TOTAL HIP REVISION (Left)  SURGEON:  Surgeon(s) and Role:    * Loanne Drilling, MD - Primary  PHYSICIAN ASSISTANT:   ASSISTANTS: Leilani Able, PA-C   ANESTHESIA:   general  EBL:  Total I/O In: 900 [I.V.:900] Out: 300 [Urine:100; Blood:200]  BLOOD ADMINISTERED:none  DRAINS: (Medium) Hemovact drain(s) in the left hip with  Suction Open   LOCAL MEDICATIONS USED:  NONE  SPECIMEN:  No Specimen  DISPOSITION OF SPECIMEN:  N/A  COUNTS:  YES  TOURNIQUET:  * No tourniquets in log *  DICTATION: .Other Dictation: Dictation Number [id number]  PLAN OF CARE: Admit to inpatient   PATIENT DISPOSITION:  PACU - hemodynamically stable.   }

## 2012-05-28 NOTE — Progress Notes (Signed)
Per patient, she states she does not want to sign consent for surgery d/t "daughter signs all that kind of stuff", dtr aware signature will be needed for surgery, nursing to call dtr in the am for update and signature.  Nursing to observe for status changes.

## 2012-05-28 NOTE — Clinical Documentation Improvement (Signed)
Abnormal Labs Clarification  THIS DOCUMENT IS NOT A PERMANENT PART OF THE MEDICAL RECORD  TO RESPOND TO THE THIS QUERY, FOLLOW THE INSTRUCTIONS BELOW:  1. If needed, update documentation for the patient's encounter via the notes activity.  2. Access this query again and click edit on the [employer].  3. After updating, or not, click F2 to complete all highlighted (required) fields concerning your review. Select "additional documentation in the medical record" OR "no additional documentation provided".  4. Click Sign note button.  5. The deficiency will fall out of your InBasket *Please let us know if you are not able to complete this workflow by phone or e-mail (listed below).  Please update your documentation within the medical record to reflect your response to this query.                                                                                   05/28/12  Dear Dr.  Lestine Box Marton Redwood  In a better effort to capture your patient's severity of illness, reflect appropriate length of stay and utilization of resources, a review of the medical record has revealed the following indicators.    Based on your clinical judgment, please clarify and document in a progress note and/or discharge summary the clinical condition associated with the following supporting information:  In responding to this query please exercise your independent judgment.  The fact that a query is asked, does not imply that any particular answer is desired or expected.  Abnormal findings (laboratory, x-ray, pathologic, and other diagnostic results) are not coded and reported unless the physician indicates their clinical significance.   The medical record reflects the following clinical findings, please clarify the diagnostic and/or clinical significance:       Per xrays: "Minimal scoliosis"......"Diffuse osteopenia" noted .......Marland Kitchen If these are appropriate secondary diagnoses influencing the case  please  document in progress notes.  Thank you     Possible Clinical Conditions?                               X Osteopenia   Minimal Scoliosis  Abnormal readings   Other Condition             Cannot Clinically Determine    Reviewed: Osteopenia; qualify for current conditions. Will add it to progress note and discharge summary.... Thanks   Thank You,  Leonette Most Addison  Clinical Documentation Specialist RN, BSN:  Pager [phone]//HIM off # [phone]  Health Information Management Friend

## 2012-05-28 NOTE — Anesthesia Preprocedure Evaluation (Addendum)
Anesthesia Evaluation  Patient identified by MRN, date of birth, ID band Patient awake    Reviewed: Allergy & Precautions, H&P , NPO status , Patient's Chart, lab work & pertinent test results  Airway Mallampati: II TM Distance: >3 FB Neck ROM: Full    Dental  (+) Edentulous Upper and Edentulous Lower   Pulmonary COPD breath sounds clear to auscultation  Pulmonary exam normal       Cardiovascular hypertension, Pt. on medications and Pt. on home beta blockers +CHF Rhythm:Irregular Rate:Normal     Neuro/Psych negative neurological ROS  negative psych ROS   GI/Hepatic negative GI ROS, Neg liver ROS,   Endo/Other  negative endocrine ROS  Renal/GU negative Renal ROS  negative genitourinary   Musculoskeletal negative musculoskeletal ROS (+)   Abdominal   Peds  Hematology negative hematology ROS (+)   Anesthesia Other Findings   Reproductive/Obstetrics negative OB ROS                          Anesthesia Physical Anesthesia Plan  ASA: III  Anesthesia Plan: General   Post-op Pain Management:    Induction: Intravenous  Airway Management Planned: Oral ETT  Additional Equipment:   Intra-op Plan:   Post-operative Plan: Extubation in OR  Informed Consent: I have reviewed the patients History and Physical, chart, labs and discussed the procedure including the risks, benefits and alternatives for the proposed anesthesia with the patient or authorized representative who has indicated his/her understanding and acceptance.   Dental advisory given  Plan Discussed with: CRNA  Anesthesia Plan Comments:         Anesthesia Quick Evaluation

## 2012-05-28 NOTE — Progress Notes (Signed)
Nurse called patient's daughter this morning re; informed consent and the need for it to be signed d/t pt refusing to sign stating "my daughter will sign that, she signs all my stuff".  Daughter stated she will be here soon (at hospital).  Nursing will continue to follow for status changes.

## 2012-05-28 NOTE — Progress Notes (Signed)
PT Cancellation Note  Treatment cancelled today due to patient receiving procedure or test.  Pt currently on bedrest and transferring to [facility] for surgery today.  PT hold evaluation.  Please reorder PT when appropriate.  Thanks!!  Kloe Oates 05/28/2012, 8:03 AM Jake Shark, PT DPT [phone]

## 2012-05-28 NOTE — Clinical Social Work Psychosocial (Signed)
     Clinical Social Work Department BRIEF PSYCHOSOCIAL ASSESSMENT 05/28/2012  Patient:  [name], [name]     Account Number:  [id number]     Admit date:  05/27/2012  Clinical Social Worker:  Burnard Hawthorne  Date/Time:  05/28/2012 09:31 AM  Referred by:  Physician  Date Referred:  05/27/2012 Referred for  Psychosocial assessment   Other Referral:   Patient MAY require short term SNF   Interview type:  Patient Other interview type:    PSYCHOSOCIAL DATA Living Status:  WITH ADULT CHILDREN Admitted from facility:   Level of care:   Primary support name:  [name]  [phone] Primary support relationship to patient:  CHILD, ADULT Degree of support available:   Very supportive family    CURRENT CONCERNS Current Concerns  Other - See comment   Other Concerns:   Return home with HH vs short term SNF    SOCIAL WORK ASSESSMENT / PLAN CSW referred to see this 71 year old female for "psychosocial needs".  Met with patient this a.m.  She is scheduled for surgery for a fractured hip today and will be transferred to [facility] for the procedure. Patient states that she will be glad to get the surgery "over with".  She lives at home with her 2 sons; one works and the other son is always at home. Patient relates that her daughter and son-in-law live next door and are very supportive as well.  Patient hopes to return home with Home Health at d/c but agrees to see what MD/PT recommendation is after surgery. She has a rolling walker at home and a history of Home Health services.  Will notify [facility] CSW- Jaime Haindinger- Ortho CSW of patient for follow up.   Assessment/plan status:  Psychosocial Support/Ongoing Assessment of Needs Other assessment/ plan:   Information/referral to community resources:   Patient deferred at this time; awaiting surgery and then will determine d/c plan and needs.    PATIENTS/FAMILYS RESPONSE TO PLAN OF CARE: Patient is alert and oriented. Very  pleasant lady who ha a very positive attitude and outlook on pending surgery. She feels that she has a large, supportive family and wants to return home with [facility] follow up after surgery if possible. Patient agrees to consider SNF if strongly recommended by MD/PT but hopes to return home.  CSW will continue to monitor.

## 2012-05-28 NOTE — Anesthesia Postprocedure Evaluation (Signed)
  Anesthesia Post-op Note  Patient: [name]  Procedure(s) Performed: Procedure(s) (LRB): OPEN REDUCTION INTERNAL FIXATION (ORIF) PERIPROSTHETIC FRACTURE (Left) TOTAL HIP REVISION (Left)  Patient Location: PACU  Anesthesia Type: General  Level of Consciousness: awake, alert  and oriented  Airway and Oxygen Therapy: Patient Spontanous Breathing and Patient connected to nasal cannula oxygen  Post-op Pain: none  Post-op Assessment: Post-op Vital signs reviewed  Post-op Vital Signs: Reviewed and stable  Complications: No apparent anesthesia complications

## 2012-05-28 NOTE — Progress Notes (Signed)
INITIAL ADULT NUTRITION ASSESSMENT Date: 05/28/2012   Time: 10:40 AM Reason for Assessment: Consult-hip fracture protocol   INTERVENTION: 1. Add Ensure BID once diet is advanced 2. RD will continue to follow    ASSESSMENT: Female 71 y.o.  Dx: hip fracture    Hx:  Past Medical History  Diagnosis Date  . COPD (chronic obstructive pulmonary disease)   . Rectal adenocarcinoma   . Lower GI bleed     Related Meds:     . budesonide-formoterol  2 puff Inhalation BID  . cloNIDine  0.2 mg Oral Once  . cloNIDine  0.2 mg Oral BID  . docusate sodium  100 mg Oral BID  . enoxaparin (LOVENOX) injection  40 mg Subcutaneous Daily  . famotidine  40 mg Oral QHS  . ferrous sulfate  325 mg Oral TID PC  . furosemide  40 mg Oral Daily  . hydrALAZINE  5 mg Intravenous Once  . hydrALAZINE  10 mg Oral TID  . metoprolol tartrate  25 mg Oral BID  . multivitamin with minerals  1 tablet Oral Daily  . ondansetron      . ondansetron      . potassium chloride SA  20 mEq Oral BID  . potassium chloride  40 mEq Oral Once  . senna  1 tablet Oral BID  . tiotropium  18 mcg Inhalation Daily  . white petrolatum      . DISCONTD: budesonide-formoterol  2 puff Inhalation BID  . DISCONTD: tiotropium  18 mcg Inhalation Daily     Ht:   Ht Readings from Last 1 Encounters:  No data found for Ht  5'1" (155 cm) per pt report   Wt:   Wt Readings from Last 5 Encounters:  No data found for Wt  103 lbs (46.8 kg )on bed scale with blankets and pillow and pt in traction, likely elevated several lbs    Ideal Wt:    47.8 kg  % Ideal Wt: 98%  Usual Wt: 89-93 lbs per pt/daughter report. Has been stable at that weight.  % Usual Wt: 110%  BMI (using UBW of 93 lbs) = 17.6 kg/(m^2), pt meets criteria for underweight at UBW   Food/Nutrition Related Hx: pt reports good appetite and intake, no recent weight loss.   Labs:  CMP     Component Value Date/Time   NA 138 05/28/2012 0500   K 3.8 05/28/2012 0500   CL  101 05/28/2012 0500   CO2 23 05/28/2012 0500   GLUCOSE 80 05/28/2012 0500   BUN 13 05/28/2012 0500   CREATININE 0.93 05/28/2012 0500   CALCIUM 9.3 05/28/2012 0500   PROT 7.1 05/27/2012 1536   ALBUMIN 3.3* 05/27/2012 1536   AST 33 05/27/2012 1536   ALT 12 05/27/2012 1536   ALKPHOS 151* 05/27/2012 1536   BILITOT 0.5 05/27/2012 1536   GFRNONAA 60* 05/28/2012 0500   GFRAA 69* 05/28/2012 0500    Intake/Output Summary (Last 24 hours) at 05/28/12 1050 Last data filed at 05/28/12 0557  Gross per 24 hour  Intake      0 ml  Output    640 ml  Net   -640 ml     Diet Order: NPO  Supplements/Tube Feeding: none  IVF:    0.9 % NaCl with KCl 20 mEq / L Last Rate: 50 mL/hr at 05/27/12 1630    Estimated Nutritional Needs:   Kcal: 1300-1500  Protein: 50-60 gm  Fluid:  > 1.5 L  Pt admitted with  hip fracture, RD consulted as part of hip fracture protocol. Pt reports that she has always been thin/small. No recent changes in weight or intake. Very hungry at this time, wants a "Pepsi Cola" but is NPO for surgery later today at [facility]. Pt did drink Ensure in the past, open to starting it post op for increased kcal and protein needs for healing.  Pt appears very thin, has some wasting evident at clavicles. Based on her UBW pt is underweight. Pt may have some level of malnutrition though unable to make a dx at this time.   NUTRITION DIAGNOSIS: -Predicted suboptimal energy intake (NI-1.6).  Status: Ongoing  RELATED TO: increased needs post op  AS EVIDENCE BY: likely poor appetite from pain/medications post op   MONITORING/EVALUATION(Goals): Goal: PO intake post op will provide >/=90% estimated nutrition needs to promote healing Monitor: PO intake, weight, labs, healing  EDUCATION NEEDS: -No education needs identified at this time   DOCUMENTATION CODES Per approved criteria  -Underweight    Clarene Duke RD, LDN Pager [phone] After Hours pager [phone]

## 2012-05-28 NOTE — Progress Notes (Signed)
[facility] PROGRESS NOTE  [name] ZOX:[id number] DOB: 1941/06/27 DOA: 05/27/2012 PCP: No primary provider on file.  Assessment/Plan: Active Problems:  Hip fracture, left  HTN (hypertension)  COPD (chronic obstructive pulmonary disease)  History of rectal cancer  Malnutrition of moderate degree  Hip fracture without a fall or any kind of history of trauma Scheduled for surgery by Dr. Claudie Leach, and [facility] at around 4 PM  Hypertension stable received clonidine in the emergency room  COPD previous smoker continues Symbicort without exacerbation   Disposition anticipate discharge to SNF after surgery   Code Status: full Family Communication: family updated about patient's clinical progress Disposition Plan:  As above    Brief narrative: 71 year old female presents to the emergency with leg pain that started approximately 2 hours before coming to the ER.The patient denies any trauma and cannot identify a mechanism of injury She was found to have a Transverse fracture of the femoral shaft at the level of the distal aspect of the femoral component of the prosthesis.     Consultants: Loanne Drilling, MD        HPI/Subjective: Waiting for surgery  Objective: Filed Vitals:   05/28/12 0231 05/28/12 0400 05/28/12 0556 05/28/12 0841  BP: 108/51  142/51   Pulse: 71  64   Temp: 98.4 F (36.9 C)  98.6 F (37 C)   TempSrc: Axillary     Resp: 20 16 20    SpO2: 100% 100% 98% 98%    Intake/Output Summary (Last 24 hours) at 05/28/12 1138 Last data filed at 05/28/12 0900  Gross per 24 hour  Intake      0 ml  Output    640 ml  Net   -640 ml    Exam:  HENT:  Head: Atraumatic.  Nose: Nose normal.  Mouth/Throat: Oropharynx is clear and moist.  Eyes: Conjunctivae are normal. Pupils are equal, round, and reactive to light. No scleral icterus.  Neck: Neck supple. No tracheal deviation present.  Cardiovascular: Normal rate, regular rhythm, normal  heart sounds and intact distal pulses.  Pulmonary/Chest: Effort normal and breath sounds normal. No respiratory distress.  Abdominal: Soft. Normal appearance and bowel sounds are normal. She exhibits no distension. There is no tenderness.  Musculoskeletal: She exhibits no edema and no tenderness.  Neurological: She is alert. No cranial nerve deficit.    Data Reviewed: Basic Metabolic Panel:  Lab 05/28/12 8119 05/27/12 1536 05/27/12 1143  NA 138 137 139  K 3.8 3.3* 3.0*  CL 101 102 102  CO2 23 22 25   GLUCOSE 80 109* 108*  BUN 13 8 8   CREATININE 0.93 0.57 0.66  CALCIUM 9.3 9.2 9.3  MG -- -- --  PHOS -- -- --    Liver Function Tests:  Lab 05/27/12 1536  AST 33  ALT 12  ALKPHOS 151*  BILITOT 0.5  PROT 7.1  ALBUMIN 3.3*   No results found for this basename: LIPASE:5,AMYLASE:5 in the last 168 hours No results found for this basename: AMMONIA:5 in the last 168 hours  CBC:  Lab 05/28/12 0500 05/27/12 1143  WBC 6.4 6.7  NEUTROABS -- 5.3  HGB 11.7* 13.3  HCT 35.8* 40.2  MCV 92.5 90.5  PLT 164 199    Cardiac Enzymes: No results found for this basename: CKTOTAL:5,CKMB:5,CKMBINDEX:5,TROPONINI:5 in the last 168 hours BNP (last 3 results) No results found for this basename: PROBNP:3 in the last 8760 hours   CBG: No results found for this basename: GLUCAP:5 in the  last 168 hours  No results found for this or any previous visit (from the past 240 hour(s)).   Studies: Dg Chest 1 View  05/27/2012  *RADIOLOGY REPORT*  Clinical Data: Left femur fracture.  CHEST - 1 VIEW  Comparison: 06/08/2011.  Findings: The heart remains normal in size.  The lungs remain hyperexpanded with prominent central pulmonary arteries.  Minimal scoliosis.  Diffuse osteopenia.  IMPRESSION: Stable changes of COPD with evidence of pulmonary arterial hypertension.  No acute abnormality.  Original Report Authenticated By: Darrol Angel, M.D.   Dg Hip Complete Left  05/27/2012  *RADIOLOGY REPORT*   Clinical Data: Pain  LEFT HIP - COMPLETE 2+ VIEW  Comparison: No comparison studies available.  Findings: Left total hip replacement noted in this patient with marked diffuse bony demineralization.  Overlying metallic support device/brace is evident.  Periprosthetic femur fracture is seen on correlative femur films performed at the same time, but is not visualized on this left hip study.  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  IMPRESSION: Status post left total hip replacement.  The femoral component remains located in the acetabular cup.  Femur films performed at the same time as this study demonstrate a periprosthetic femur fracture at the tip of the femoral component.  Original Report Authenticated By: ERIC A. MANSELL, M.D.   Dg Femur Left  05/27/2012  *RADIOLOGY REPORT*  Clinical Data: Left leg pain.  No known injury.  LEFT FEMUR - 2 VIEW  Comparison: Left hip radiographs obtained at the same time.  Findings: Left total hip prosthesis.  Transverse fracture of the femoral shaft at the level of the distal aspect of the femoral component of the prosthesis.  There is a one third two one half shaft width of lateral displacement of the distal fragment as well as posterior angulation of the distal fragment.  There is periprosthetic lucency in the distal portion of the proximal fragment with a thin anterior cortex along the anterior margin of the distal portion of the prosthetic component.  Diffuse osteopenia is noted as well as arterial calcifications.  IMPRESSION: Left femoral shaft fracture, as described above.  Original Report Authenticated By: Darrol Angel, M.D.    Scheduled Meds:   . budesonide-formoterol  2 puff Inhalation BID  . cloNIDine  0.2 mg Oral Once  . cloNIDine  0.2 mg Oral BID  . docusate sodium  100 mg Oral BID  . enoxaparin (LOVENOX) injection  40 mg Subcutaneous Daily  . famotidine  40 mg Oral QHS  . ferrous sulfate  325 mg Oral TID PC  . furosemide   40 mg Oral Daily  . hydrALAZINE  5 mg Intravenous Once  . hydrALAZINE  10 mg Oral TID  . metoprolol tartrate  25 mg Oral BID  . multivitamin with minerals  1 tablet Oral Daily  . ondansetron      . potassium chloride SA  20 mEq Oral BID  . potassium chloride  40 mEq Oral Once  . senna  1 tablet Oral BID  . tiotropium  18 mcg Inhalation Daily  . white petrolatum      . DISCONTD: budesonide-formoterol  2 puff Inhalation BID  . DISCONTD: tiotropium  18 mcg Inhalation Daily   Continuous Infusions:   . 0.9 % NaCl with KCl 20 mEq / L 50 mL/hr at 05/27/12 1630    Active Problems:  Hip fracture, left  HTN (hypertension)  COPD (chronic obstructive pulmonary disease)  History of rectal cancer  Malnutrition of moderate degree    Time spent: 40 minutes   [facility]  [facility] Pager [phone]. If 8PM-8AM, please contact night-coverage at [email or web address], password [facility] 05/28/2012, 11:38 AM  LOS: 1 day

## 2012-05-28 NOTE — Progress Notes (Signed)
Gave report to RN receiving patient at [facility], [facility] called. Transfer went well, Consent and blood forms sent over, patient had no complaints , family was going with patient and will be going to telemetry floor awaiting surgery this evening.

## 2012-05-28 NOTE — Progress Notes (Signed)
Pt BP still high, 224/84 Manually.  Pt states she is a little nauseous.  Dr. Gwenlyn Perking notified, orders received.  Will continue to monitor. Pt scheduled for OR in 15 minutes.

## 2012-05-28 NOTE — Preoperative (Signed)
Beta Blockers   Reason not to administer Beta Blockers:Not Applicable pt took beta blocker within 24 hrs. 

## 2012-05-28 NOTE — Progress Notes (Signed)
Orthopedic Tech Progress Note Patient Details:  [name] 08/10/1941 [id number]  Patient ID: [name], female   DOB: 22-Dec-1940, 71 y.o.   MRN: [id number] Confirmed pt has OHF on bed.  [name] 05/28/2012, 1:14 PM

## 2012-05-29 DIAGNOSIS — S72453A Displaced supracondylar fracture without intracondylar extension of lower end of unspecified femur, initial encounter for closed fracture: Secondary | ICD-10-CM

## 2012-05-29 DIAGNOSIS — D62 Acute posthemorrhagic anemia: Secondary | ICD-10-CM | POA: Diagnosis not present

## 2012-05-29 DIAGNOSIS — E871 Hypo-osmolality and hyponatremia: Secondary | ICD-10-CM

## 2012-05-29 DIAGNOSIS — Z96649 Presence of unspecified artificial hip joint: Secondary | ICD-10-CM

## 2012-05-29 LAB — BASIC METABOLIC PANEL
BUN: 13 mg/dL (ref 6–23)
Calcium: 8.6 mg/dL (ref 8.4–10.5)
Chloride: 101 mEq/L (ref 96–112)
Creatinine, Ser: 0.93 mg/dL (ref 0.50–1.10)
GFR calc Af Amer: >90 mL/min (ref >90)
GFR calc non Af Amer: 83 mL/min — ABNORMAL LOW (ref >90)
Glucose, Bld: 80 mg/dL (ref 70–99)
Potassium: 3.8 mEq/L (ref 3.5–5.1)
Potassium: 3.9 mEq/L (ref 3.5–5.1)
Sodium: 134 mEq/L — ABNORMAL LOW (ref 135–145)

## 2012-05-29 LAB — CBC
MCH: 30.8 pg (ref 26.0–34.0)
MCHC: 33.4 g/dL (ref 30.0–36.0)
RDW: 15.5 % (ref 11.5–15.5)

## 2012-05-29 MED ORDER — CALCIUM CARBONATE-VITAMIN D 500-200 MG-UNIT PO TABS
1.0000 | ORAL_TABLET | Freq: Two times a day (BID) | ORAL | Status: DC
  Administered 2012-05-29 – 2012-06-02 (×9): 1 via ORAL
  Filled 2012-05-29 (×10): qty 1

## 2012-05-29 MED ORDER — ENSURE COMPLETE PO LIQD
237.0000 mL | Freq: Two times a day (BID) | ORAL | Status: DC
  Administered 2012-05-29 – 2012-06-02 (×5): 237 mL via ORAL

## 2012-05-29 NOTE — Progress Notes (Addendum)
[facility] PROGRESS NOTE  ERSA DELANEY EAV:[id number] DOB: [birth year] DOA: 05/27/2012 PCP: No primary provider on file.  Assessment/Plan: 1-Hip fracture, left:per ortho; patient w/o significant pain after surgery and per PT/OT will be a candidate for CIR; will ask inpatient rehab to evaluate patient; if not a candidate will d/c to SNF.  2-HTN (hypertension): stable and well controlled; will continue current regimen.  3-COPD (chronic obstructive pulmonary disease):no wheezing; and w/o SOB. Will continue symbicort.  4-Malnutrition of moderate degree: encourage PO intake, continue daily MV and ensure.  5-Postop Acute blood loss anemia: will follow Hgb trend; no need for transfusion at this point.   6-osteopenia: Patient started on vitamin D and calcium.  7-DVT: lovenox.  Code Status: full Family Communication: family updated about patient's clinical progress Disposition Plan:  As above    Brief narrative: 71 year old female presents to the emergency with leg pain that started approximately 2 hours before coming to the ER.The patient denies any trauma and cannot identify a mechanism of injury She was found to have a Transverse fracture of the femoral shaft at the level of the distal aspect of the femoral component of the prosthesis.     Consultants: Loanne Drilling, MD    HPI/Subjective: No overnight events; denies chest pain, shortness of breath, fever or any other acute complaints. Pain is well control using just Tylenol.  Objective: Filed Vitals:   05/28/12 2329 05/29/12 0155 05/29/12 0643 05/29/12 0800  BP: 149/58 166/77 167/78   Pulse:  79 78   Temp:  97.8 F (36.6 C) 98.6 F (37 C)   TempSrc:  Oral Oral   Resp:  18 18 18   Height:      Weight:   39.4 kg (86 lb 13.8 oz)   SpO2:  99% 99%     Intake/Output Summary (Last 24 hours) at 05/29/12 1355 Last data filed at 05/29/12 0657  Gross per 24 hour  Intake   2600 ml  Output   2650 ml  Net    -50 ml     Exam: General: In no acute distress. Cooperative to examination. Reports her pain is well control. Head: Atraumatic.  Nose: Nose normal.  Mouth/Throat: Oropharynx is clear and moist.  Eyes: Conjunctivae are normal. Pupils are equal, round, and reactive to light. No scleral icterus.  Neck: Neck supple. No tracheal deviation present.  Cardiovascular: S1 and S2 appreciated; no rubs or gallops.  Pulmonary/Chest: Effort normal and breath sounds normal. No respiratory distress.  Abdominal: Soft. Normal appearance and bowel sounds are normal. She exhibits no distension. There is no tenderness.  Extremities: Left lower strandy with wound VAC in place after orthopedic surgery; no swelling or erythema around the wound. The rest of her extremities w/o any swelling or pain. Neurological: Alert, awake and oriented x3; CN intact. No focal motor or sensory deficits.   Data Reviewed: Basic Metabolic Panel:  Lab 05/29/12 7829 05/28/12 0500 05/27/12 1536 05/27/12 1143  NA 134* 138 137 139  K 3.9 3.8 3.3* 3.0*  CL 99 101 102 102  CO2 23 23 22 25   GLUCOSE 140* 80 109* 108*  BUN 21 13 8 8   CREATININE 0.75 0.93 0.57 0.66  CALCIUM 8.6 9.3 9.2 9.3  MG -- -- -- --  PHOS -- -- -- --    Liver Function Tests:  Lab 05/27/12 1536  AST 33  ALT 12  ALKPHOS 151*  BILITOT 0.5  PROT 7.1  ALBUMIN 3.3*    CBC:  Lab 05/29/12  0425 05/28/12 0500 05/27/12 1143  WBC 7.8 6.4 6.7  NEUTROABS -- -- 5.3  HGB 9.8* 11.7* 13.3  HCT 29.3* 35.8* 40.2  MCV 92.1 92.5 90.5  PLT 194 164 199     Recent Results (from the past 240 hour(s))  SURGICAL PCR SCREEN     Status: Abnormal   Collection Time   05/28/12  2:19 PM      Component Value Range Status Comment   MRSA, PCR NEGATIVE  NEGATIVE Final    Staphylococcus aureus POSITIVE (*) NEGATIVE Final      Studies: Dg Chest 1 View  05/27/2012  *RADIOLOGY REPORT*  Clinical Data: Left femur fracture.  CHEST - 1 VIEW  Comparison: 06/08/2011.  Findings: The heart  remains normal in size.  The lungs remain hyperexpanded with prominent central pulmonary arteries.  Minimal scoliosis.  Diffuse osteopenia.  IMPRESSION: Stable changes of COPD with evidence of pulmonary arterial hypertension.  No acute abnormality.  Original Report Authenticated By: Darrol Angel, M.D.   Dg Hip Complete Left  05/27/2012  *RADIOLOGY REPORT*  Clinical Data: Pain  LEFT HIP - COMPLETE 2+ VIEW  Comparison: No comparison studies available.  Findings: Left total hip replacement noted in this patient with marked diffuse bony demineralization.  Overlying metallic support device/brace is evident.  Periprosthetic femur fracture is seen on correlative femur films performed at the same time, but is not visualized on this left hip study.  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  IMPRESSION: Status post left total hip replacement.  The femoral component remains located in the acetabular cup.  Femur films performed at the same time as this study demonstrate a periprosthetic femur fracture at the tip of the femoral component.  Original Report Authenticated By: ERIC A. MANSELL, M.D.   Dg Femur Left  05/27/2012  *RADIOLOGY REPORT*  Clinical Data: Left leg pain.  No known injury.  LEFT FEMUR - 2 VIEW  Comparison: Left hip radiographs obtained at the same time.  Findings: Left total hip prosthesis.  Transverse fracture of the femoral shaft at the level of the distal aspect of the femoral component of the prosthesis.  There is a one third two one half shaft width of lateral displacement of the distal fragment as well as posterior angulation of the distal fragment.  There is periprosthetic lucency in the distal portion of the proximal fragment with a thin anterior cortex along the anterior margin of the distal portion of the prosthetic component.  Diffuse osteopenia is noted as well as arterial calcifications.  IMPRESSION: Left femoral shaft fracture, as described above.  Original  Report Authenticated By: Darrol Angel, M.D.    Scheduled Meds:    . acetaminophen  1,000 mg Intravenous Q6H  . budesonide-formoterol  2 puff Inhalation BID  . calcium-vitamin D  1 tablet Oral BID  .  ceFAZolin (ANCEF) IV  1 g Intravenous Q6H  . cloNIDine  0.2 mg Oral BID  . docusate sodium  100 mg Oral BID  . enoxaparin (LOVENOX) injection  30 mg Subcutaneous Q24H  . famotidine  40 mg Oral QHS  . ferrous sulfate  325 mg Oral TID PC  . furosemide  40 mg Oral Daily  . hydrALAZINE      . hydrALAZINE  10 mg Intravenous Once  . hydrALAZINE  10 mg Oral TID  . labetalol      . metoprolol tartrate  25 mg Oral BID  . multivitamin with minerals  1 tablet Oral  Daily  . ondansetron  4 mg Intravenous Once  . potassium chloride SA  20 mEq Oral BID  . senna  1 tablet Oral BID  . tiotropium  18 mcg Inhalation Daily  . DISCONTD: docusate sodium  100 mg Oral BID  . DISCONTD: enoxaparin (LOVENOX) injection  40 mg Subcutaneous Daily  . DISCONTD: scopolamine  1 patch Transdermal Q72H   Continuous Infusions:    . 0.9 % NaCl with KCl 20 mEq / L 50 mL/hr at 05/29/12 0245  . DISCONTD: lactated ringers    . DISCONTD: lactated ringers       Time spent: 30 minutes   Kienna Moncada  [facility] Pager [phone]. If 8PM-8AM, please contact night-coverage at [email or web address], password [facility] 05/29/2012, 1:55 PM  LOS: 2 days

## 2012-05-29 NOTE — Progress Notes (Signed)
CSW met with patient re: discharge planning. Note PT/OT recommended CIR - awaiting CIR consult. Patient states she lives at home with her 2 sons, Rory & Jeena Arnett, her daughter [name] (c#: [id number]) lives across the street. Patient is agreeable to SNF search as a backup to CIR.   Clinical Social Work Department CLINICAL SOCIAL WORK PLACEMENT NOTE 05/29/2012  Patient:  [name], ANES  Account Number:  [id number] Admit date:  05/27/2012  Clinical Social Worker:  Orpah Greek  Date/time:  05/29/2012 01:00 PM  Clinical Social Work is seeking post-discharge placement for this patient at the following level of care:   SKILLED NURSING   (*CSW will update this form in Epic as items are completed)   05/29/2012  Patient/family provided with [facility] Department of Clinical Social Work's list of facilities offering this level of care within the geographic area requested by the patient (or if unable, by the patient's family).  05/29/2012  Patient/family informed of their freedom to choose among providers that offer the needed level of care, that participate in Medicare, Medicaid or managed care program needed by the patient, have an available bed and are willing to accept the patient.  05/29/2012  Patient/family informed of [facility]' ownership interest in [facility], as well as of the fact that they are under no obligation to receive care at this facility.  PASARR submitted to EDS on 05/29/2012 PASARR number received from EDS on 05/29/2012  FL2 transmitted to all facilities in geographic area requested by pt/family on  05/29/2012 FL2 transmitted to all facilities within larger geographic area on   Patient informed that his/her managed care company has contracts with or will negotiate with  certain facilities, including the following:     Patient/family informed of bed offers received:   Patient chooses bed at  Physician recommends and patient chooses bed at    Patient  to be transferred to  on   Patient to be transferred to facility by   The following physician request were entered in Epic:   Additional Comments:    Unice Bailey, LCSW [facility] Clinical Social Worker cell #: [phone]

## 2012-05-29 NOTE — Progress Notes (Signed)
   Subjective: 1 Day Post-Op Procedure(s) (LRB): OPEN REDUCTION INTERNAL FIXATION (ORIF) PERIPROSTHETIC FRACTURE (Left) TOTAL HIP REVISION (Left) Patient reports pain as mild.   Patient seen in rounds with Dr. Lequita Halt. Patient is well, and has had no acute complaints or problems.  She was sleeping well and awakens easily. We will start therapy today.  Plan is to go Skilled nursing facility after hospital stay.  Objective: Vital signs in last 24 hours: Temp:  [97.2 F (36.2 C)-98.6 F (37 C)] 98.6 F (37 C) (08/01 0643) Pulse Rate:  [76-92] 78  (08/01 0643) Resp:  [17-21] 18  (08/01 0643) BP: (142-224)/(51-89) 167/78 mmHg (08/01 0643) SpO2:  [93 %-100 %] 99 % (08/01 0643) Weight:  [35.5 kg (78 lb 4.2 oz)-39.4 kg (86 lb 13.8 oz)] 39.4 kg (86 lb 13.8 oz) (08/01 0643)  Intake/Output from previous day:  Intake/Output Summary (Last 24 hours) at 05/29/12 0805 Last data filed at 05/29/12 0657  Gross per 24 hour  Intake   2600 ml  Output   2650 ml  Net    -50 ml    Intake/Output this shift:    Labs:  Basename 05/29/12 0425 05/28/12 0500 05/27/12 1143  HGB 9.8* 11.7* 13.3    Basename 05/29/12 0425 05/28/12 0500  WBC 7.8 6.4  RBC 3.18* 3.87  HCT 29.3* 35.8*  PLT 194 164    Basename 05/29/12 0425 05/28/12 0500  NA 134* 138  K 3.9 3.8  CL 99 101  CO2 23 23  BUN 21 13  CREATININE 0.75 0.93  GLUCOSE 140* 80  CALCIUM 8.6 9.3   No results found for this basename: LABPT:2,INR:2 in the last 72 hours  EXAM General - Patient is Alert, Appropriate and Oriented Extremity - Neurovascular intact Sensation intact distally Dorsiflexion/Plantar flexion intact No cellulitis present Dressing - dressing C/D/I Motor Function - intact, moving foot and toes well on exam.  Hemovac left in place today.  Past Medical History  Diagnosis Date  . COPD (chronic obstructive pulmonary disease)   . Rectal adenocarcinoma   . Lower GI bleed     Assessment/Plan: 1 Day Post-Op  Procedure(s) (LRB): OPEN REDUCTION INTERNAL FIXATION (ORIF) PERIPROSTHETIC FRACTURE (Left) TOTAL HIP REVISION (Left) Active Problems:  Hip fracture, left  HTN (hypertension)  COPD (chronic obstructive pulmonary disease)  History of rectal cancer  Malnutrition of moderate degree  Postop Hyponatremia  Postop Acute blood loss anemia   Advance diet Up with therapy Discharge to SNF once progressing.  DVT Prophylaxis - Lovenox Partial Weight Bearing  left Leg D/C Knee Immobilizer Begin Therapy Hip Preacutions Change dressing tomorrow. Plan to go to rehab.  [name] 05/29/2012, 8:05 AM
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
## 2012-05-29 NOTE — Consult Note (Signed)
Physical Medicine and Rehabilitation Consult Reason for Consult: Left peri- (Prosthetic femur fracture Referring Physician: [facility]   HPI: [name] is a 71 y.o. 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 Admitted 05/27/2012 with left hip and leg pain x2 weeks. X-rays and imaging revealed complex periprosthetic femur fracture with loose femoral component. Underwent ORIF with left total hip revision 05/28/2012 per Dr. Despina Hick. Advised partial weightbearing of 25-50%. Placed on subcutaneous Lovenox for DVT prophylaxis. Physical and occupational therapy ongoing. Recommendations were made for physical medicine rehabilitation consult to consider inpatient rehabilitation services   Review of Systems  Respiratory: Positive for shortness of breath.   Gastrointestinal: Positive for constipation.  Musculoskeletal: Positive for myalgias.  Neurological: Positive for weakness.  All other systems reviewed and are negative.   Past Medical History  Diagnosis Date  . COPD (chronic obstructive pulmonary disease)   . Rectal adenocarcinoma   . Lower GI bleed    Past Surgical History  Procedure Date  . Hip arthroplasty   . Colonoscopy   . Rectal surgery    History reviewed. No pertinent family history. Social History:  reports that she quit smoking about 2 years ago. Her smoking use included Cigarettes. She does not have any smokeless tobacco history on file. Her alcohol and drug histories not on file. Allergies:  Allergies  Allergen Reactions  . Morphine And Related Other (See Comments)    "makes me crazy"  . Ciprofloxacin Hives and Rash   Medications Prior to Admission  Medication Sig Dispense Refill  . acetaminophen (TYLENOL) 650 MG CR tablet Take 650 mg by mouth 2 (two) times daily as needed. For pain      . budesonide-formoterol (SYMBICORT) 160-4.5 MCG/ACT inhaler Inhale 2 puffs into the lungs 2 (two) times daily.      . cloNIDine (CATAPRES) 0.2 MG tablet  Take 0.2 mg by mouth 2 (two) times daily.      . famotidine (PEPCID) 40 MG tablet Take 40 mg by mouth at bedtime.      . furosemide (LASIX) 40 MG tablet Take 40 mg by mouth daily.      . hydrALAZINE (APRESOLINE) 10 MG tablet Take 10 mg by mouth 3 (three) times daily.      . metoprolol tartrate (LOPRESSOR) 25 MG tablet Take 25 mg by mouth 2 (two) times daily.      . Multiple Vitamin (MULTIVITAMIN WITH MINERALS) TABS Take 1 tablet by mouth daily.      . potassium chloride SA (K-DUR,KLOR-CON) 20 MEQ tablet Take 20 mEq by mouth 2 (two) times daily.      Marland Kitchen tiotropium (SPIRIVA) 18 MCG inhalation capsule Place 18 mcg into inhaler and inhale daily.        Home: Home Living Lives With: Son (2 sons, 1 works, 1 is home, daughter is next door.) Available Help at Discharge: Family Type of Home: House Home Access: Stairs to enter [employer] of Steps: 2 in front w/ no rails, deck has more steps Entrance Stairs-Rails: None Home Layout: One level Bathroom Shower/Tub: Engineer, manufacturing systems: Standard Home Adaptive Equipment: Bedside commode/3-in-1;Walker - rolling;Grab bars in shower;Quad cane  Functional History: Prior Function Bath: Supervision/set-up (daughter assits) Able to Take Stairs?: Yes Driving: No Vocation: Retired Functional Status:  Mobility: Bed Mobility Bed Mobility: Supine to Sit Supine to Sit: 3: Mod assist Transfers Transfers: Sit to Stand;Stand to [employer] Transfers Sit to Stand: 3: Mod assist;With upper extremity assist;With armrests;From chair/3-in-1 Stand to Sit:  3: Mod assist;To chair/3-in-1;With upper extremity assist;With armrests Stand Pivot Transfers: 3: Mod assist Ambulation/Gait Ambulation/Gait Assistance: 3: Mod assist Ambulation Distance (Feet): 5 Feet Assistive device: Rolling walker    ADL: ADL Grooming: Performed;Wash/dry face;Set up Where Assessed - Grooming: Unsupported sitting Upper Body Bathing: Performed;Set up Where  Assessed - Upper Body Bathing: Unsupported sitting Lower Body Bathing: Performed;+1 Total assistance Where Assessed - Lower Body Bathing: Supported sit to stand Upper Body Dressing: Simulated;Set up Where Assessed - Upper Body Dressing: Unsupported sitting Lower Body Dressing: Simulated;+1 Total assistance Where Assessed - Lower Body Dressing: Supported sit to stand Toilet Transfer: Simulated;Moderate assistance Toilet Transfer Method: Sit to stand Toilet Transfer Equipment: Other (comment) (recliner x 2) Equipment Used: Rolling walker ADL Comments: Pt c/o feeling dizzy upon standing. BP 140/62. Fatigues quickly.  Cognition: Cognition Arousal/Alertness: Awake/alert Orientation Level: Oriented X4 Cognition Overall Cognitive Status: Appears within functional limits for tasks assessed/performed Arousal/Alertness: Awake/alert Orientation Level: Appears intact for tasks assessed Behavior During Session: [facility] for tasks performed  Blood pressure 167/78, pulse 78, temperature 98.6 F (37 C), temperature source Oral, resp. rate 18, height 5\' 6"  (1.676 m), weight 39.4 kg (86 lb 13.8 oz), SpO2 99.00%. Physical Exam  Vitals reviewed. Constitutional: She is oriented to person, place, and time.       Thin elderly female.  HENT:  Head: Normocephalic.  Eyes: Left eye exhibits no discharge.       Pupils round and reactive to light.  Neck: Neck supple. No thyromegaly present.  Cardiovascular: Normal rate and regular rhythm.   Pulmonary/Chest: Breath sounds normal. No respiratory distress. She has no wheezes.  Abdominal: Bowel sounds are normal. She exhibits no distension. There is no tenderness.  Musculoskeletal: She exhibits no edema.  Neurological: She is alert and oriented to person, place, and time.  Skin:       Hip incision dressed  Psychiatric: She has a normal mood and affect.  motor: 5/5 in bilateral deltoid, biceps, triceps, grip, 3 minus/5 in the right hip flexor knee extensor 4  ankle dorsiflexor plantar flexor on the left trace in the hip flexor knee extensor and 3 at the ankle dorsiflexor plantar flexor Sensory intact to light touch in bilateral upper and lower extremities  Results for orders placed during the hospital encounter of 05/27/12 (from the past 24 hour(s))  SURGICAL PCR SCREEN     Status: Abnormal   Collection Time   05/28/12  2:19 PM      Component Value Range   MRSA, PCR NEGATIVE  NEGATIVE   Staphylococcus aureus POSITIVE (*) NEGATIVE  TYPE AND SCREEN     Status: Normal (Preliminary result)   Collection Time   05/28/12  7:00 PM      Component Value Range   ABO/RH(D) O POS     Antibody Screen NEG     Sample Expiration 05/31/2012     Unit Number [id number]     Blood Component Type RBC LR PHER1     Unit division 00     Status of Unit ALLOCATED     Transfusion Status OK TO TRANSFUSE     Crossmatch Result Compatible     Unit Number [id number]     Blood Component Type RBC LR PHER1     Unit division 00     Status of Unit ALLOCATED     Transfusion Status OK TO TRANSFUSE     Crossmatch Result Compatible    PREPARE RBC (CROSSMATCH)     Status: Normal  Collection Time   05/28/12  7:00 PM      Component Value Range   Order Confirmation ORDER PROCESSED BY BLOOD BANK    CBC     Status: Abnormal   Collection Time   05/29/12  4:25 AM      Component Value Range   WBC 7.8  4.0 - 10.5 K/uL   RBC 3.18 (*) 3.87 - 5.11 MIL/uL   Hemoglobin 9.8 (*) 12.0 - 15.0 g/dL   HCT 16.1 (*) 09.6 - 04.5 %   MCV 92.1  78.0 - 100.0 fL   MCH 30.8  26.0 - 34.0 pg   MCHC 33.4  30.0 - 36.0 g/dL   RDW 40.9  81.1 - 91.4 %   Platelets 194  150 - 400 K/uL  BASIC METABOLIC PANEL     Status: Abnormal   Collection Time   05/29/12  4:25 AM      Component Value Range   Sodium 134 (*) 135 - 145 mEq/L   Potassium 3.9  3.5 - 5.1 mEq/L   Chloride 99  96 - 112 mEq/L   CO2 23  19 - 32 mEq/L   Glucose, Bld 140 (*) 70 - 99 mg/dL   BUN 21  6 - 23 mg/dL   Creatinine, Ser 7.82  0.50 -  1.10 mg/dL   Calcium 8.6  8.4 - 95.6 mg/dL   GFR calc non Af Amer 83 (*) >90 mL/min   GFR calc Af Amer >90  >90 mL/min   Dg Pelvis Portable  05/28/2012  *RADIOLOGY REPORT*  Clinical Data: Postoperative radiograph.  Left hip operation.  PORTABLE PELVIS  Comparison: 06/05/2011 CT.  05/27/2012 radiograph.  Findings: In conjunction with the left hip radiograph, there is a new left femoral stem with lateral rib forcing plate and encircling cables.  Expected postsurgical changes are present in the soft tissues.  Chronic osteolysis around the proximal femur.  Improved alignment of the periprosthetic fracture around the femoral stem. 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  IMPRESSION: New left femoral prosthesis with lateral rib forcing plate and encircling cables.  Original Report Authenticated By: Andreas Newport, M.D.   Dg Hip Portable 1 View Left  05/28/2012  *RADIOLOGY REPORT*  Clinical Data: Left hip surgery.  PORTABLE LEFT HIP - 1 VIEW  Comparison: 05/27/2012.  Findings: New left femoral component is present with lateral rib forcing plate encircling cables.  Periprosthetic fracture from prior left femoral stem is again noted with mild step-off deformity along the medial aspect of the mid left femoral diaphysis. Expected postsurgical changes in the soft tissues.  IMPRESSION: Revision of left total hip arthroplasty with new left femoral stem and reinforcing lateral plate with encircling cables.  Original Report Authenticated By: Andreas Newport, M.D.    Assessment/Plan: Diagnosis: left periprosthetic femur fracture with revision total hip arthroplasty postoperative day #1 1. Does the need for close, 24 hr/day medical supervision in concert with the patient's rehab needs make it unreasonable for this patient to be served in a less intensive setting? Yes 2. Co-Morbidities requiring supervision/potential complications: hypertension, COPD 3. Due to bladder management,  bowel management, safety, skin/wound care, pain management and patient education, does the patient require 24 hr/day rehab nursing? Yes 4. Does the patient require coordinated care of a physician, rehab nurse, PT (1-2 hrs/day, 5 days/week) and OT (1-2 hrs/day, 5 days/week) to address physical and functional deficits in the context of the above medical diagnosis(es)? Yes Addressing deficits in the  following areas: balance, endurance, locomotion, strength, transferring, bowel/bladder control, bathing, dressing and toileting 5. Can the patient actively participate in an intensive therapy program of at least 3 hrs of therapy per day at least 5 days per week? Yes 6. The potential for patient to make measurable gains while on inpatient rehab is excellent 7. Anticipated functional outcomes upon discharge from inpatient rehab are modified independent ability with PT, modified independent ADLs with OT, not applicable with SLP. 8. Estimated rehab length of stay to reach the above functional goals is: 10 days 9. Does the patient have adequate social supports to accommodate these discharge functional goals? Yes 10. Anticipated D/C setting: Home 11. Anticipated post D/C treatments: HH therapy 12. Overall Rehab/Functional Prognosis: excellent  RECOMMENDATIONS: This patient's condition is appropriate for continued rehabilitative care in the following setting: CIR Patient has agreed to participate in recommended program. Yes Note that insurance prior authorization may be required for reimbursement for recommended care.  Comment:    05/29/2012

## 2012-05-29 NOTE — Evaluation (Signed)
Occupational Therapy Evaluation Patient Details Name: [name] MRN: [id number] DOB: September 11, 1941 Today's Date: 05/29/2012 Time: 6440-3474 OT Time Calculation (min): 29 min  OT Assessment / Plan / Recommendation Clinical Impression  Pt is a 71 yo female who presents with w/ periprosthetic fx L femur  and loosening of component. Pt underwent ORIF femur and revision THA . Skilled OT recommended to maximize independence with BADLs to min A/minguard level in prep for d/c to next venue of care or home with 24/7 A and HHOT.    OT Assessment  Patient needs continued OT Services    Follow Up Recommendations  Inpatient Rehab    Barriers to Discharge      Equipment Recommendations  None recommended by OT    Recommendations for Other Services Rehab consult  Frequency  Min 2X/week    Precautions / Restrictions Precautions Precautions: Posterior Hip (old  THA) Precaution Comments: Pt unable to recall any hip precautions. Restrictions Weight Bearing Restrictions: Yes LLE Weight Bearing: Partial weight bearing LLE Partial Weight Bearing Percentage or Pounds: 25-50%   Pertinent Vitals/Pain Reported dizziness upon standing BP 140/62    ADL  Grooming: Performed;Wash/dry face;Set up Where Assessed - Grooming: Unsupported sitting Upper Body Bathing: Performed;Set up Where Assessed - Upper Body Bathing: Unsupported sitting Lower Body Bathing: Performed;+1 Total assistance Where Assessed - Lower Body Bathing: Supported sit to stand Upper Body Dressing: Simulated;Set up Where Assessed - Upper Body Dressing: Unsupported sitting Lower Body Dressing: Simulated;+1 Total assistance Where Assessed - Lower Body Dressing: Supported sit to stand Toilet Transfer: Simulated;Moderate assistance Toilet Transfer Method: Sit to stand Toilet Transfer Equipment: Other (comment) (recliner x 2) Toileting - Clothing Manipulation and Hygiene: Simulated;+1 Total assistance Equipment Used: Rolling walker ADL  Comments: Pt c/o feeling dizzy upon standing. BP 140/62. Fatigues quickly.    OT Diagnosis: Generalized weakness  OT Problem List: Decreased activity tolerance;Decreased safety awareness;Decreased knowledge of use of DME or AE;Decreased knowledge of precautions OT Treatment Interventions: Self-care/ADL training;Therapeutic activities;DME and/or AE instruction;Patient/family education   OT Goals Acute Rehab OT Goals OT Goal Formulation: With patient Time For Goal Achievement: 06/12/12 Potential to Achieve Goals: Good ADL Goals Pt Will Perform Grooming: Other (comment);Standing at sink (minguard A) ADL Goal: Grooming - Progress: Goal set today Pt Will Perform Lower Body Bathing: with min assist;Sit to stand from chair;Sit to stand from bed;with adaptive equipment ADL Goal: Lower Body Bathing - Progress: Goal set today Pt Will Perform Lower Body Dressing: with min assist;Sit to stand from bed;Sit to stand from chair;with adaptive equipment ADL Goal: Lower Body Dressing - Progress: Goal set today Pt Will Transfer to Toilet: Maintaining hip precautions;3-in-1;Comfort height toilet;Maintaining weight bearing status;Other (comment) (w/minguard A and RW.) ADL Goal: Statistician - Progress: Progressing toward goals Pt Will Perform Toileting - Clothing Manipulation: with min assist;Standing ADL Goal: Toileting - Clothing Manipulation - Progress: Goal set today Pt Will Perform Toileting - Hygiene: with min assist;Sit to stand from 3-in-1/toilet ADL Goal: Toileting - Hygiene - Progress: Goal set today  Visit Information  Last OT Received On: 05/29/12 Assistance Needed: +2 (if going to ambulate)    Subjective Data  Subjective: I hurt my hip a long time ago. Patient Stated Goal: I'd like to cook and clean again.   Prior Functioning  Vision/Perception  Home Living Lives With: Son (2 sons, 1 works, 1 is home, daughter is next door.) Available Help at Discharge: Family Type of Home:  House Home Access: Stairs to enter [employer] of Steps:  2 in front w/ no rails, deck has more steps Entrance Stairs-Rails: None Home Layout: One level Bathroom Shower/Tub: Engineer, manufacturing systems: Standard Home Adaptive Equipment: Bedside commode/3-in-1;Walker - rolling;Grab bars in shower;Quad cane Prior Function Level of Independence: Independent;Needs assistance Needs Assistance: Bathing Bath: Supervision/set-up (daughter assits) Able to Take Stairs?: Yes Driving: No Vocation: Retired Musician: [employer] Dominant Hand: Right      Cognition  Overall Cognitive Status: Appears within functional limits for tasks assessed/performed Arousal/Alertness: Awake/alert Orientation Level: Appears intact for tasks assessed Behavior During Session: [facility] for tasks performed    Extremity/Trunk Assessment Right Upper Extremity Assessment RUE ROM/Strength/Tone: [facility] for tasks assessed Left Upper Extremity Assessment LUE ROM/Strength/Tone: WFL for tasks assessed  Trunk Assessment Trunk Assessment: Kyphotic   Mobility Bed Mobility Bed Mobility: Supine to Sit Supine to Sit: 3: Mod assist Details for Bed Mobility Assistance: pt able to slide both legs to edge but required scooting assit to edge and support of LLE to prevent flexion/ discomfort. Transfers Sit to Stand: 3: Mod assist;With upper extremity assist;With armrests;From chair/3-in-1 Stand to Sit: 3: Mod assist;To chair/3-in-1;With upper extremity assist;With armrests Details for Transfer Assistance: Max VCs for hand placement, technique. Assist needed to rise, stabilize, and control descent.   Exercise    Balance Static Sitting Balance Static Sitting - Balance Support: Bilateral upper extremity supported Static Sitting - Level of Assistance: 5: Stand by assistance  End of Session OT - End of Session Activity Tolerance: Patient tolerated treatment well Patient left: in chair;with call bell/phone within  reach  GO     [name] A OTR/L [phone] 05/29/2012, 12:21 PM

## 2012-05-30 LAB — BASIC METABOLIC PANEL
BUN: 24 mg/dL — ABNORMAL HIGH (ref 6–23)
CO2: 26 mEq/L (ref 19–32)
Calcium: 8.9 mg/dL (ref 8.4–10.5)
Chloride: 101 mEq/L (ref 96–112)
Creatinine, Ser: 0.74 mg/dL (ref 0.50–1.10)
Glucose, Bld: 137 mg/dL — ABNORMAL HIGH (ref 70–99)

## 2012-05-30 LAB — CBC
HCT: 24.6 % — ABNORMAL LOW (ref 36.0–46.0)
MCH: 30.5 pg (ref 26.0–34.0)
MCV: 90.4 fL (ref 78.0–100.0)
Platelets: 192 10*3/uL (ref 150–400)
RBC: 2.72 MIL/uL — ABNORMAL LOW (ref 3.87–5.11)
WBC: 11.5 10*3/uL — ABNORMAL HIGH (ref 4.0–10.5)

## 2012-05-30 MED ORDER — FUROSEMIDE 10 MG/ML IJ SOLN
20.0000 mg | Freq: Once | INTRAMUSCULAR | Status: AC
  Administered 2012-05-30: 20 mg via INTRAVENOUS
  Filled 2012-05-30 (×2): qty 2

## 2012-05-30 NOTE — Progress Notes (Signed)
   Subjective: 2 Days Post-Op Procedure(s) (LRB): OPEN REDUCTION INTERNAL FIXATION (ORIF) PERIPROSTHETIC FRACTURE (Left) TOTAL HIP REVISION (Left) Patient reports pain as mild and moderate.  She was resting well but awakens easily. Patient seen in rounds with Dr. Lequita Halt. Patient is well, and has had no acute complaints or problems Plan is to go Skilled nursing facility after hospital stay.  Objective: Vital signs in last 24 hours: Temp:  [97.8 F (36.6 C)-98.3 F (36.8 C)] 98.1 F (36.7 C) (08/02 0454) Pulse Rate:  [64-86] 75  (08/02 0454) Resp:  [20-24] 20  (08/02 0454) BP: (116-171)/(56-71) 147/71 mmHg (08/02 0454) SpO2:  [99 %-100 %] 100 % (08/02 0454)  Intake/Output from previous day:  Intake/Output Summary (Last 24 hours) at 05/30/12 0816 Last data filed at 05/30/12 0500  Gross per 24 hour  Intake   1060 ml  Output   1380 ml  Net   -320 ml    Intake/Output this shift:    Labs:  Basename 05/30/12 0450 05/29/12 0425 05/28/12 0500 05/27/12 1143  HGB 8.3* 9.8* 11.7* 13.3    Basename 05/30/12 0450 05/29/12 0425  WBC 11.5* 7.8  RBC 2.72* 3.18*  HCT 24.6* 29.3*  PLT 192 194    Basename 05/30/12 0450 05/29/12 0425  NA 134* 134*  K 4.1 3.9  CL 101 99  CO2 26 23  BUN 24* 21  CREATININE 0.74 0.75  GLUCOSE 137* 140*  CALCIUM 8.9 8.6   No results found for this basename: LABPT:2,INR:2 in the last 72 hours  EXAM General - Patient is Alert, Appropriate and Oriented Extremity - Neurovascular intact Sensation intact distally Dorsiflexion/Plantar flexion intact No cellulitis present Compartment soft Dressing/Incision - clean, dry, no drainage, healing, staples intact.  Hemovac removed without difficulty. Motor Function - intact, moving foot and toes well on exam.   Past Medical History  Diagnosis Date  . COPD (chronic obstructive pulmonary disease)   . Rectal adenocarcinoma   . Lower GI bleed     Assessment/Plan: 2 Days Post-Op Procedure(s) (LRB): OPEN  REDUCTION INTERNAL FIXATION (ORIF) PERIPROSTHETIC FRACTURE (Left) TOTAL HIP REVISION (Left) Active Problems:  Hip fracture, left  HTN (hypertension)  COPD (chronic obstructive pulmonary disease)  History of rectal cancer  Malnutrition of moderate degree  Postop Hyponatremia  Postop Acute blood loss anemia  Up with therapy Discharge to SNF Partial Weight Bearing left Leg DVT Prophylaxis - Lovenox From an ortho standpoint, she will need SNF.  Probably to SNF Monday if medically ready. Hip Preacutions  PERKINS, ALEXZANDREW 05/30/2012, 8:16 AM

## 2012-05-30 NOTE — Progress Notes (Signed)
Pt received a phone call from [employer]. I took the call because pt was sleeping at the time. The representative was Chari Manning and she asked me to tell the pt that she did not qualify for inpatient rehab, but that she did qualify for rehab at a SNF. This message was relayed to the patient and Tresa Endo, CSW. Pt verbalized understanding of the message from her insurance company.  Arta Bruce [facility] 05/30/2012 3:03 PM

## 2012-05-30 NOTE — Progress Notes (Signed)
Rehab admissions - I have received a denial for inpatient rehab admission.  Most appropriate for SNF per insurance carrier.  Recommend pursuit of SNF when medically ready.  #[phone]

## 2012-05-30 NOTE — Progress Notes (Signed)
Physical Therapy Treatment Patient Details Name: [name] MRN: [id number] DOB: 1941-05-27 Today's Date: 05/30/2012 Time: 3086-5784 PT Time Calculation (min): 28 min  PT Assessment / Plan / Recommendation Comments on Treatment Session  Pt. appears very fatigued today. was not able to progress as anticipated. Daughter present. Daughter wants to consider SNF vs. CIR.     Follow Up Recommendations  Inpatient Rehab    Barriers to Discharge        Equipment Recommendations  Defer to next venue    Recommendations for Other Services    Frequency Min 4X/week   Plan Discharge plan remains appropriate;Frequency remains appropriate    Precautions / Restrictions Precautions Precautions: Fall;Posterior Hip Precaution Comments: Pt still unable to recall any hip precautions. Restrictions Weight Bearing Restrictions: Yes LLE Weight Bearing: Partial weight bearing LLE Partial Weight Bearing Percentage or Pounds: 25-50%   Pertinent Vitals/Pain L leg is sore. No meds requested.    Mobility  Bed Mobility Bed Mobility: Supine to Sit;Sitting - Scoot to Edge of Bed Supine to Sit: 3: Mod assist Sitting - Scoot to Edge of Bed: 4: Min assist Details for Bed Mobility Assistance: physical A needed for LEs and to scoot to EOB. Transfers Transfers: Sit to Stand;Stand to Sit Sit to Stand: 3: Mod assist Stand to Sit: 3: Mod assist Details for Transfer Assistance: Assist needed to rise and stabilize and to control descent. VCs needed for posture, weight bearing, hand placement.  Ambulation/Gait Ambulation/Gait Assistance: 1: +2 Total assist Ambulation Distance (Feet): 8 Feet Assistive device: Rolling walker Ambulation/Gait Assistance Details: frequent VC for posture, sequence and PWB.  pt tends to lean forward. Pt. did not have very much energy today. Gait Pattern: Step-to pattern;Decreased stance time - left;Antalgic Gait velocity: slow    Exercises     PT Diagnosis:    PT Problem List:     PT Treatment Interventions:     PT Goals Acute Rehab PT Goals Pt will go Supine/Side to Sit: with supervision PT Goal: Supine/Side to Sit - Progress: Progressing toward goal Pt will go Sit to Stand: with supervision PT Goal: Sit to Stand - Progress: Progressing toward goal Pt will go Stand to Sit: with supervision PT Goal: Stand to Sit - Progress: Progressing toward goal Pt will Ambulate: 51 - 150 feet;with rolling walker PT Goal: Ambulate - Progress: Progressing toward goal  Visit Information  Last PT Received On: 05/30/12 Assistance Needed: +2    Subjective Data  Subjective: I am just so tired   Cognition  Overall Cognitive Status: Appears within functional limits for tasks assessed/performed Arousal/Alertness: Awake/alert Orientation Level: Appears intact for tasks assessed Behavior During Session: [facility] for tasks performed Cognition - Other Comments: groggy today    Balance     End of Session PT - End of Session Activity Tolerance: Patient limited by fatigue Patient left: in chair;with call bell/phone within reach;with family/visitor present Nurse Communication: Mobility status   GP     Rada Hay 05/30/2012, 1:16 PM

## 2012-05-30 NOTE — ED Provider Notes (Signed)
Medical screening examination/treatment/procedure(s) were conducted as a shared visit with non-physician practitioner(s) and myself.  I personally evaluated the patient during the encounter Patient presented with leg pain and deformity. She had a hip replacement done years ago but daughter states it's always been abnormal but never was repaired. Patient has evidence of a femur fracture today on exam. She is neurovascularly intact distally. Plain films show fracture just below the prosthetic  Gwyneth Sprout, MD 05/30/12 1610

## 2012-05-30 NOTE — Progress Notes (Signed)
Rehab admissions - Evaluated for possible admission.  I spoke with patient this morning.  I have called and faxed her information to [employer] carrier.  Awaiting response from insurance carrier.  Call me for questions.  #[phone]

## 2012-05-30 NOTE — Progress Notes (Signed)
Occupational Therapy Treatment Patient Details Name: [name] MRN: [id number] DOB: [birth year] Today's Date: 05/30/2012 Time: 4098-1191 OT Time Calculation (min): 17 min  OT Assessment / Plan / Recommendation Comments on Treatment Session Pt limited this session by fatigue, possibly 2* fatigue, decreased hgb.     Follow Up Recommendations  Skilled nursing facility    Barriers to Discharge       Equipment Recommendations  Defer to next venue    Recommendations for Other Services    Frequency Min 2X/week   Plan Discharge plan remains appropriate    Precautions / Restrictions Precautions Precautions: Fall;Posterior Hip Precaution Comments: Pt still unable to recall any hip precautions. Restrictions Weight Bearing Restrictions: Yes LLE Weight Bearing: Partial weight bearing LLE Partial Weight Bearing Percentage or Pounds: 25-50%   Pertinent Vitals/Pain     ADL  Grooming: Performed;Wash/dry face Where Assessed - Grooming: Supported sitting Toilet Transfer: Simulated;Moderate assistance Toilet Transfer Method: Sit to stand ADL Comments: Pt limited by fatigue this am. Attempted to ambulate to the bathroom but was only able to make it a few feet. Max cues to bear weight through LLE.    OT Diagnosis:    OT Problem List:   OT Treatment Interventions:     OT Goals ADL Goals ADL Goal: Grooming - Progress: Progressing toward goals ADL Goal: Toilet Transfer - Progress: Progressing toward goals  Visit Information  Last OT Received On: 05/30/12 Assistance Needed: +2 PT/OT Co-Evaluation/Treatment: Yes    Subjective Data  Subjective: You girls tire me out.   Prior Functioning       Cognition  Overall Cognitive Status: Appears within functional limits for tasks assessed/performed Arousal/Alertness: Awake/alert Orientation Level: Appears intact for tasks assessed Behavior During Session: [facility] for tasks performed Cognition - Other Comments: groggy today    Mobility Bed  Mobility Supine to Sit: 3: Mod assist Details for Bed Mobility Assistance: physical A needed for LEs and to scoot to EOB. Transfers Sit to Stand: 3: Mod assist;With upper extremity assist;From bed Stand to Sit: 3: Mod assist;With upper extremity assist;With armrests;To chair/3-in-1 Details for Transfer Assistance: Assist needed to rise and stabilize and to control descent. VCs needed for posture, weight bearing, hand placement.    Exercises    Balance    End of Session OT - End of Session Activity Tolerance: Patient limited by fatigue Patient left: in chair;with call bell/phone within reach;with family/visitor present  GO     Makailee Nudelman A OTR/L [phone] 05/30/2012, 11:38 AM

## 2012-05-30 NOTE — Progress Notes (Signed)
Rehab admissions - I spoke with daughter today.  Dtr is in favor of inpatient rehab admission.  I am waiting to hear back from insurance carrier about possible inpatient rehab admission.  I will update you as soon as I can.  Call me for questions.  #[phone]

## 2012-05-30 NOTE — Progress Notes (Signed)
[facility] PROGRESS NOTE  ANIEYA HELMAN ZOX:[id number] DOB: 1941/04/02 DOA: 05/27/2012 PCP: No primary provider on file.  Assessment/Plan: 1-Hip fracture, left: per ortho; patient w/o significant pain after surgery. Will continue daily dressing; lovenox for DVT (X 1 week; then change to ASA). Patient denied by insurance for CIR. Will arrange for SNF most likely Monday at discharge.  2-HTN (hypertension): stable and well controlled; continue current regimen.  3-COPD (chronic obstructive pulmonary disease):no wheezing; and w/o SOB. Will continue symbicort.  4-Malnutrition of moderate degree: encourage PO intake, continue daily MV and ensure.  5-Postop Acute blood loss anemia: Hgb down to 8.3; will transfuse 2 units and follow CBC in am.   6-osteopenia: Continue vitamin D and calcium.  7-DVT: lovenox.  Code Status: full Family Communication: family updated about patient's clinical progress Disposition Plan:  As above    Brief narrative: 71 year old female presents to the emergency with leg pain that started approximately 2 hours before coming to the ER.The patient denies any trauma and cannot identify a mechanism of injury She was found to have a Transverse fracture of the femoral shaft at the level of the distal aspect of the femoral component of the prosthesis.     Consultants: Loanne Drilling, MD    HPI/Subjective: No overnight events; denies chest pain, shortness of breath, fever or any other acute complaints.    Objective: Filed Vitals:   05/30/12 0454 05/30/12 0832 05/30/12 1030 05/30/12 1334  BP: 147/71  151/72 107/53  Pulse: 75  89 77  Temp: 98.1 F (36.7 C)  98.3 F (36.8 C) 98 F (36.7 C)  TempSrc: Oral  Oral Oral  Resp: 20  20 18   Height:      Weight:      SpO2: 100% 98% 100% 92%    Intake/Output Summary (Last 24 hours) at 05/30/12 1510 Last data filed at 05/30/12 1400  Gross per 24 hour  Intake   1240 ml  Output   1730 ml  Net   -490 ml     Exam: General: In no acute distress. Cooperative to examination. Reports her pain is well control. Head: Atraumatic.  Nose: Nose normal.  Mouth/Throat: Oropharynx is clear and moist.  Eyes: Conjunctivae are normal. Pupils are equal, round, and reactive to light. No scleral icterus.  Neck: Neck supple. No tracheal deviation present.  Cardiovascular: S1 and S2 appreciated; no rubs or gallops.  Pulmonary/Chest: Effort normal and breath sounds normal. No respiratory distress.  Abdominal: Soft. Normal appearance and bowel sounds are normal. She exhibits no distension. There is no tenderness.  Extremities: Left lower strandy with wound VAC in place after orthopedic surgery; no swelling or erythema around the wound. The rest of her extremities w/o any swelling or pain. Neurological: Alert, awake and oriented x3; CN intact. No focal motor or sensory deficits.   Data Reviewed: Basic Metabolic Panel:  Lab 05/30/12 8119 05/29/12 0425 05/28/12 0500 05/27/12 1536 05/27/12 1143  NA 134* 134* 138 137 139  K 4.1 3.9 3.8 3.3* 3.0*  CL 101 99 101 102 102  CO2 26 23 23 22 25   GLUCOSE 137* 140* 80 109* 108*  BUN 24* 21 13 8 8   CREATININE 0.74 0.75 0.93 0.57 0.66  CALCIUM 8.9 8.6 9.3 9.2 9.3  MG -- -- -- -- --  PHOS -- -- -- -- --    Liver Function Tests:  Lab 05/27/12 1536  AST 33  ALT 12  ALKPHOS 151*  BILITOT 0.5  PROT 7.1  ALBUMIN 3.3*    CBC:  Lab 05/30/12 0450 05/29/12 0425 05/28/12 0500 05/27/12 1143  WBC 11.5* 7.8 6.4 6.7  NEUTROABS -- -- -- 5.3  HGB 8.3* 9.8* 11.7* 13.3  HCT 24.6* 29.3* 35.8* 40.2  MCV 90.4 92.1 92.5 90.5  PLT 192 194 164 199     Recent Results (from the past 240 hour(s))  SURGICAL PCR SCREEN     Status: Abnormal   Collection Time   05/28/12  2:19 PM      Component Value Range Status Comment   MRSA, PCR NEGATIVE  NEGATIVE Final    Staphylococcus aureus POSITIVE (*) NEGATIVE Final      Studies: Dg Chest 1 View  05/27/2012  *RADIOLOGY REPORT*   Clinical Data: Left femur fracture.  CHEST - 1 VIEW  Comparison: 06/08/2011.  Findings: The heart remains normal in size.  The lungs remain hyperexpanded with prominent central pulmonary arteries.  Minimal scoliosis.  Diffuse osteopenia.  IMPRESSION: Stable changes of COPD with evidence of pulmonary arterial hypertension.  No acute abnormality.  Original Report Authenticated By: Darrol Angel, M.D.   Dg Hip Complete Left  05/27/2012  *RADIOLOGY REPORT*  Clinical Data: Pain  LEFT HIP - COMPLETE 2+ VIEW  Comparison: No comparison studies available.  Findings: Left total hip replacement noted in this patient with marked diffuse bony demineralization.  Overlying metallic support device/brace is evident.  Periprosthetic femur fracture is seen on correlative femur films performed at the same time, but is not visualized on this left hip study.  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  IMPRESSION: Status post left total hip replacement.  The femoral component remains located in the acetabular cup.  Femur films performed at the same time as this study demonstrate a periprosthetic femur fracture at the tip of the femoral component.  Original Report Authenticated By: ERIC A. MANSELL, M.D.   Dg Femur Left  05/27/2012  *RADIOLOGY REPORT*  Clinical Data: Left leg pain.  No known injury.  LEFT FEMUR - 2 VIEW  Comparison: Left hip radiographs obtained at the same time.  Findings: Left total hip prosthesis.  Transverse fracture of the femoral shaft at the level of the distal aspect of the femoral component of the prosthesis.  There is a one third two one half shaft width of lateral displacement of the distal fragment as well as posterior angulation of the distal fragment.  There is periprosthetic lucency in the distal portion of the proximal fragment with a thin anterior cortex along the anterior margin of the distal portion of the prosthetic component.  Diffuse osteopenia is noted as well as  arterial calcifications.  IMPRESSION: Left femoral shaft fracture, as described above.  Original Report Authenticated By: Darrol Angel, M.D.    Scheduled Meds:    . budesonide-formoterol  2 puff Inhalation BID  . calcium-vitamin D  1 tablet Oral BID  . cloNIDine  0.2 mg Oral BID  . docusate sodium  100 mg Oral BID  . enoxaparin (LOVENOX) injection  30 mg Subcutaneous Q24H  . famotidine  40 mg Oral QHS  . feeding supplement  237 mL Oral BID BM  . ferrous sulfate  325 mg Oral TID PC  . furosemide  20 mg Intravenous Once  . furosemide  40 mg Oral Daily  . hydrALAZINE  10 mg Oral TID  . metoprolol tartrate  25 mg Oral BID  . multivitamin with minerals  1 tablet Oral Daily  . potassium chloride SA  20 mEq Oral BID  . senna  1 tablet Oral BID  . tiotropium  18 mcg Inhalation Daily   Continuous Infusions:    . 0.9 % NaCl with KCl 20 mEq / L 20 mL/hr (05/29/12 1606)     Time spent: 30 minutes   Bracken Moffa  [facility] Pager [phone]. If 8PM-8AM, please contact night-coverage at [email or web address], password [facility] 05/30/2012, 3:10 PM  LOS: 3 days

## 2012-05-31 DIAGNOSIS — I5032 Chronic diastolic (congestive) heart failure: Secondary | ICD-10-CM

## 2012-05-31 DIAGNOSIS — I509 Heart failure, unspecified: Secondary | ICD-10-CM

## 2012-05-31 LAB — CBC
HCT: 30.8 % — ABNORMAL LOW (ref 36.0–46.0)
MCH: 30.5 pg (ref 26.0–34.0)
MCHC: 34.4 g/dL (ref 30.0–36.0)
MCV: 88.8 fL (ref 78.0–100.0)
Platelets: 137 10*3/uL — ABNORMAL LOW (ref 150–400)
RDW: 15.2 % (ref 11.5–15.5)
WBC: 8 10*3/uL (ref 4.0–10.5)

## 2012-05-31 NOTE — Progress Notes (Signed)
Subjective: 3 Days Post-Op Procedure(s) (LRB): OPEN REDUCTION INTERNAL FIXATION (ORIF) PERIPROSTHETIC FRACTURE (Left) TOTAL HIP REVISION (Left) Patient reports pain as mild.    Objective: Vital signs in last 24 hours: Temp:  [97.8 F (36.6 C)-99.2 F (37.3 C)] 97.9 F (36.6 C) (08/03 0536) Pulse Rate:  [60-92] 69  (08/03 0536) Resp:  [16-22] 19  (08/03 0536) BP: (107-161)/(53-88) 121/64 mmHg (08/03 0536) SpO2:  [92 %-100 %] 97 % (08/03 0850)  Intake/Output from previous day: 08/02 0701 - 08/03 0700 In: 1149.3 [I.V.:519.3; Blood:630] Out: 1625 [Urine:1625] Intake/Output this shift:     Basename 05/30/12 0450 05/29/12 0425  HGB 8.3* 9.8*    Basename 05/30/12 0450 05/29/12 0425  WBC 11.5* 7.8  RBC 2.72* 3.18*  HCT 24.6* 29.3*  PLT 192 194    Basename 05/30/12 0450 05/29/12 0425  NA 134* 134*  K 4.1 3.9  CL 101 99  CO2 26 23  BUN 24* 21  CREATININE 0.74 0.75  GLUCOSE 137* 140*  CALCIUM 8.9 8.6     wound dressed and dry.  NVI L LE.  Assessment/Plan: 3 Days Post-Op Procedure(s) (LRB): OPEN REDUCTION INTERNAL FIXATION (ORIF) PERIPROSTHETIC FRACTURE (Left) TOTAL HIP REVISION (Left) Discharge to SNF  Pt ok to go at any time from ortho perspective.  Toni Arthurs 05/31/2012, 9:11 AM

## 2012-05-31 NOTE — Progress Notes (Signed)
[facility] PROGRESS NOTE  [name]:[id number] DOB: 12-May-1941 DOA: 05/27/2012 PCP: No primary provider on file.  Assessment/Plan: 1-Hip fracture, left: per ortho patient is ready to be discharge when bed a SNF available; patient w/o significant pain after surgery. Will continue daily dressing; lovenox for DVT (X 1 week; then change to ASA). Patient denied by insurance for CIR. Will arrange through SW for SNF placement on Monday.  2-HTN (hypertension): stable and well controlled; continue current regimen.  3-COPD (chronic obstructive pulmonary disease): no wheezing; and w/o SOB. Continue symbicort.  4-Malnutrition of moderate degree: encourage PO intake, continue daily MV and ensure.  5-Postop Acute blood loss anemia: Hgb 10.6 after 2 units of PRBC's transfused. Patient is less pale; breathing better and w/o acute complaints. Will repeat CBC in am to follow Hgb trend  6-osteopenia: Continue vitamin D and calcium.  7-DVT: lovenox.  Code Status: full Family Communication: family updated about patient's clinical progress Disposition Plan:  As above    Brief narrative: 71 year old female presents to the emergency with leg pain that started approximately 2 hours before coming to the ER.The patient denies any trauma and cannot identify a mechanism of injury She was found to have a Transverse fracture of the femoral shaft at the level of the distal aspect of the femoral component of the prosthesis.     Consultants: Loanne Drilling, MD    HPI/Subjective: No overnight events; denies chest pain, shortness of breath, fever or any other acute complaints.    Objective: Filed Vitals:   05/31/12 0355 05/31/12 0500 05/31/12 0536 05/31/12 0850  BP: 120/68 118/64 121/64   Pulse: 68 68 69   Temp: 98 F (36.7 C) 98.1 F (36.7 C) 97.9 F (36.6 C)   TempSrc: Oral Oral Oral   Resp: 19 16 19    Height:      Weight:      SpO2: 97%  99% 97%    Intake/Output Summary (Last  24 hours) at 05/31/12 1546 Last data filed at 05/31/12 0928  Gross per 24 hour  Intake 1209.33 ml  Output   1275 ml  Net -65.67 ml    Exam: General: In no acute distress. Cooperative to examination. Reports her pain is well control. Head: Atraumatic.  Nose: Nose normal.  Mouth/Throat: Oropharynx is clear and moist.  Eyes: Conjunctivae are normal. Pupils are equal, round, and reactive to light. No scleral icterus.  Neck: Neck supple. No tracheal deviation present.  Cardiovascular: S1 and S2 appreciated; no rubs or gallops.  Pulmonary/Chest: Effort normal and breath sounds normal. No respiratory distress.  Abdominal: Soft. Normal appearance and bowel sounds are normal. She exhibits no distension. There is no tenderness.  Extremities: Left lower extremity mild pain with movement; no swelling or erythema around the wound. The rest of her extremities w/o any swelling or pain. Neurological: Alert, awake and oriented x3; CN intact. No focal motor or sensory deficits.   Data Reviewed: Basic Metabolic Panel:  Lab 05/30/12 5621 05/29/12 0425 05/28/12 0500 05/27/12 1536 05/27/12 1143  NA 134* 134* 138 137 139  K 4.1 3.9 3.8 3.3* 3.0*  CL 101 99 101 102 102  CO2 26 23 23 22 25   GLUCOSE 137* 140* 80 109* 108*  BUN 24* 21 13 8 8   CREATININE 0.74 0.75 0.93 0.57 0.66  CALCIUM 8.9 8.6 9.3 9.2 9.3  MG -- -- -- -- --  PHOS -- -- -- -- --    Liver Function Tests:  Lab 05/27/12 1536  AST 33  ALT 12  ALKPHOS 151*  BILITOT 0.5  PROT 7.1  ALBUMIN 3.3*    CBC:  Lab 05/31/12 0845 05/30/12 0450 05/29/12 0425 05/28/12 0500 05/27/12 1143  WBC 8.0 11.5* 7.8 6.4 6.7  NEUTROABS -- -- -- -- 5.3  HGB 10.6* 8.3* 9.8* 11.7* 13.3  HCT 30.8* 24.6* 29.3* 35.8* 40.2  MCV 88.8 90.4 92.1 92.5 90.5  PLT 137* 192 194 164 199     Recent Results (from the past 240 hour(s))  SURGICAL PCR SCREEN     Status: Abnormal   Collection Time   05/28/12  2:19 PM      Component Value Range Status Comment    MRSA, PCR NEGATIVE  NEGATIVE Final    Staphylococcus aureus POSITIVE (*) NEGATIVE Final      Studies: Dg Chest 1 View  05/27/2012  *RADIOLOGY REPORT*  Clinical Data: Left femur fracture.  CHEST - 1 VIEW  Comparison: 06/08/2011.  Findings: The heart remains normal in size.  The lungs remain hyperexpanded with prominent central pulmonary arteries.  Minimal scoliosis.  Diffuse osteopenia.  IMPRESSION: Stable changes of COPD with evidence of pulmonary arterial hypertension.  No acute abnormality.  Original Report Authenticated By: Darrol Angel, M.D.   Dg Hip Complete Left  05/27/2012  *RADIOLOGY REPORT*  Clinical Data: Pain  LEFT HIP - COMPLETE 2+ VIEW  Comparison: No comparison studies available.  Findings: Left total hip replacement noted in this patient with marked diffuse bony demineralization.  Overlying metallic support device/brace is evident.  Periprosthetic femur fracture is seen on correlative femur films performed at the same time, but is not visualized on this left hip study.  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  IMPRESSION: Status post left total hip replacement.  The femoral component remains located in the acetabular cup.  Femur films performed at the same time as this study demonstrate a periprosthetic femur fracture at the tip of the femoral component.  Original Report Authenticated By: ERIC A. MANSELL, M.D.   Dg Femur Left  05/27/2012  *RADIOLOGY REPORT*  Clinical Data: Left leg pain.  No known injury.  LEFT FEMUR - 2 VIEW  Comparison: Left hip radiographs obtained at the same time.  Findings: Left total hip prosthesis.  Transverse fracture of the femoral shaft at the level of the distal aspect of the femoral component of the prosthesis.  There is a one third two one half shaft width of lateral displacement of the distal fragment as well as posterior angulation of the distal fragment.  There is periprosthetic lucency in the distal portion of the  proximal fragment with a thin anterior cortex along the anterior margin of the distal portion of the prosthetic component.  Diffuse osteopenia is noted as well as arterial calcifications.  IMPRESSION: Left femoral shaft fracture, as described above.  Original Report Authenticated By: Darrol Angel, M.D.    Scheduled Meds:    . budesonide-formoterol  2 puff Inhalation BID  . calcium-vitamin D  1 tablet Oral BID  . cloNIDine  0.2 mg Oral BID  . docusate sodium  100 mg Oral BID  . enoxaparin (LOVENOX) injection  30 mg Subcutaneous Q24H  . famotidine  40 mg Oral QHS  . feeding supplement  237 mL Oral BID BM  . ferrous sulfate  325 mg Oral TID PC  . furosemide  20 mg Intravenous Once  . furosemide  40 mg Oral Daily  . hydrALAZINE  10 mg Oral TID  .  metoprolol tartrate  25 mg Oral BID  . multivitamin with minerals  1 tablet Oral Daily  . potassium chloride SA  20 mEq Oral BID  . senna  1 tablet Oral BID  . tiotropium  18 mcg Inhalation Daily   Continuous Infusions:    . 0.9 % NaCl with KCl 20 mEq / L 20 mL/hr (05/29/12 1606)     Time spent: 30 minutes   Euretha Najarro  [facility] Pager [phone]. If 8PM-8AM, please contact night-coverage at [email or web address], password [facility] 05/31/2012, 3:46 PM  LOS: 4 days

## 2012-06-01 LAB — CBC
Hemoglobin: 10.2 g/dL — ABNORMAL LOW (ref 12.0–15.0)
MCH: 30.4 pg (ref 26.0–34.0)
MCHC: 33.7 g/dL (ref 30.0–36.0)
Platelets: 154 10*3/uL (ref 150–400)
RDW: 15 % (ref 11.5–15.5)

## 2012-06-01 NOTE — Progress Notes (Signed)
    Subjective: 4 Days Post-Op Procedure(s) (LRB): OPEN REDUCTION INTERNAL FIXATION (ORIF) PERIPROSTHETIC FRACTURE (Left) TOTAL HIP REVISION (Left) Patient reports pain as 4 on 0-10 scale.   Denies CP or SOB.  Voiding without difficulty. Positive flatus. Objective: Vital signs in last 24 hours: Temp:  [99.2 F (37.3 C)-99.7 F (37.6 C)] 99.2 F (37.3 C) (08/04 0718) Pulse Rate:  [72-80] 72  (08/04 0718) Resp:  [18-20] 18  (08/04 0718) BP: (115-127)/(55-66) 115/55 mmHg (08/04 0718) SpO2:  [90 %-97 %] 91 % (08/04 0718)  Intake/Output from previous day: 08/03 0701 - 08/04 0700 In: 720 [P.O.:720] Out: 550 [Urine:550] Intake/Output this shift: Total I/O In: 60 [P.O.:60] Out: 300 [Urine:300]  Labs:  Basename 06/01/12 0730 05/31/12 0845 05/30/12 0450  HGB 10.2* 10.6* 8.3*    Basename 06/01/12 0730 05/31/12 0845  WBC 8.0 8.0  RBC 3.36* 3.47*  HCT 30.3* 30.8*  PLT 154 137*    Basename 05/30/12 0450  NA 134*  K 4.1  CL 101  CO2 26  BUN 24*  CREATININE 0.74  GLUCOSE 137*  CALCIUM 8.9   No results found for this basename: LABPT:2,INR:2 in the last 72 hours  Physical Exam: Neurologically intact ABD soft Neurovascular intact Dorsiflexion/Plantar flexion intact Incision: dressing C/D/I Compartment soft  Assessment/Plan: 4 Days Post-Op Procedure(s) (LRB): OPEN REDUCTION INTERNAL FIXATION (ORIF) PERIPROSTHETIC FRACTURE (Left) TOTAL HIP REVISION (Left) Up with therapy Discharge to SNF likely tomorrow Dr. Shon Baton evaluated the patient today  [place] for Dr. Venita Lick [facility] [phone] 06/01/2012, 8:39 AM

## 2012-06-01 NOTE — Progress Notes (Signed)
06-01-12 0600 NSG:  Pt adamantly refusing to let us draw her morning labs this morning - she is "just sick of being stuck, proded, poked and bruised"  Rationale has been given for reasons for her morning labs and pt verbalizes understanding but continues to refuse stating "maybe later in this morning.  Have rescheduled her labs for around 8 am

## 2012-06-01 NOTE — Progress Notes (Signed)
[facility] PROGRESS NOTE  [name] ZOX:[id number] DOB: November 12, 1940 DOA: 05/27/2012 PCP: No primary provider on file.  Assessment/Plan: 1-Hip fracture, left: per ortho patient is ready to be discharge when bed a SNF available; patient w/o significant pain after surgery. Will continue daily dressing; lovenox for DVT (X 1 week; then change to ASA). Patient denied by insurance for CIR. At this moment patient's family members have accepted bed for [facility] skilled nursing facility; will arrange discharge in am; in order to start physical rehab process, if patient continue to be stable.   2-HTN (hypertension): stable and well controlled; continue current regimen.  3-COPD (chronic obstructive pulmonary disease): no wheezing; and w/o SOB. Continue symbicort.  4-Malnutrition of moderate degree: encourage PO intake, continue daily MV and ensure.  5-Postop Acute blood loss anemia: Hgb 10.2 after 2 units of PRBC's transfused. No acute bleeding appreciated. Patient denies any chest pain or shortness of breath.   6-osteopenia: Continue vitamin D and calcium.  7-DVT: lovenox.  Code Status: full Family Communication: family updated about patient's clinical progress Disposition Plan:  As above    Brief narrative: 71 year old female presents to the emergency with leg pain that started approximately 2 hours before coming to the ER.The patient denies any trauma and cannot identify a mechanism of injury She was found to have a Transverse fracture of the femoral shaft at the level of the distal aspect of the femoral component of the prosthesis.     Consultants: Loanne Drilling, MD    HPI/Subjective: No overnight events; denies chest pain, shortness of breath, fever or any other acute complaints.    Objective: Filed Vitals:   05/31/12 2124 06/01/12 0718 06/01/12 0900 06/01/12 1405  BP: 127/66 115/55  109/63  Pulse: 80 72  70  Temp: 99.7 F (37.6 C) 99.2 F (37.3 C)  98 F (36.7  C)  TempSrc: Oral Oral  Oral  Resp: 20 18  20   Height:      Weight:      SpO2: 92% 91% 93% 95%    Intake/Output Summary (Last 24 hours) at 06/01/12 1526 Last data filed at 06/01/12 0900  Gross per 24 hour  Intake    900 ml  Output    750 ml  Net    150 ml    Exam: General: In no acute distress. Cooperative to examination. Reports her pain is well control. Head: Atraumatic.  Nose: Nose normal.  Mouth/Throat: Oropharynx is clear and moist.  Eyes: Conjunctivae are normal. Pupils are equal, round, and reactive to light. No scleral icterus.  Neck: Neck supple. No tracheal deviation present.  Cardiovascular: S1 and S2 appreciated; no rubs or gallops.  Pulmonary/Chest: Effort normal and breath sounds normal. No respiratory distress.  Abdominal: Soft. Normal appearance and bowel sounds are normal. She exhibits no distension. There is no tenderness.  Extremities: Left lower extremity mild pain with movement; no swelling or erythema around the wound. The rest of her extremities w/o any swelling or pain. Neurological: Alert, awake and oriented x3; CN intact. No focal motor or sensory deficits.   Data Reviewed: Basic Metabolic Panel:  Lab 05/30/12 8119 05/29/12 0425 05/28/12 0500 05/27/12 1536 05/27/12 1143  NA 134* 134* 138 137 139  K 4.1 3.9 3.8 3.3* 3.0*  CL 101 99 101 102 102  CO2 26 23 23 22 25   GLUCOSE 137* 140* 80 109* 108*  BUN 24* 21 13 8 8   CREATININE 0.74 0.75 0.93 0.57 0.66  CALCIUM 8.9 8.6  9.3 9.2 9.3  MG -- -- -- -- --  PHOS -- -- -- -- --    Liver Function Tests:  Lab 05/27/12 1536  AST 33  ALT 12  ALKPHOS 151*  BILITOT 0.5  PROT 7.1  ALBUMIN 3.3*    CBC:  Lab 06/01/12 0730 05/31/12 0845 05/30/12 0450 05/29/12 0425 05/28/12 0500 05/27/12 1143  WBC 8.0 8.0 11.5* 7.8 6.4 --  NEUTROABS -- -- -- -- -- 5.3  HGB 10.2* 10.6* 8.3* 9.8* 11.7* --  HCT 30.3* 30.8* 24.6* 29.3* 35.8* --  MCV 90.2 88.8 90.4 92.1 92.5 --  PLT 154 137* 192 194 164 --     Recent  Results (from the past 240 hour(s))  SURGICAL PCR SCREEN     Status: Abnormal   Collection Time   05/28/12  2:19 PM      Component Value Range Status Comment   MRSA, PCR NEGATIVE  NEGATIVE Final    Staphylococcus aureus POSITIVE (*) NEGATIVE Final      Studies: Dg Chest 1 View  05/27/2012  *RADIOLOGY REPORT*  Clinical Data: Left femur fracture.  CHEST - 1 VIEW  Comparison: 06/08/2011.  Findings: The heart remains normal in size.  The lungs remain hyperexpanded with prominent central pulmonary arteries.  Minimal scoliosis.  Diffuse osteopenia.  IMPRESSION: Stable changes of COPD with evidence of pulmonary arterial hypertension.  No acute abnormality.  Original Report Authenticated By: Darrol Angel, M.D.   Dg Hip Complete Left  05/27/2012  *RADIOLOGY REPORT*  Clinical Data: Pain  LEFT HIP - COMPLETE 2+ VIEW  Comparison: No comparison studies available.  Findings: Left total hip replacement noted in this patient with marked diffuse bony demineralization.  Overlying metallic support device/brace is evident.  Periprosthetic femur fracture is seen on correlative femur films performed at the same time, but is not visualized on this left hip study.  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  IMPRESSION: Status post left total hip replacement.  The femoral component remains located in the acetabular cup.  Femur films performed at the same time as this study demonstrate a periprosthetic femur fracture at the tip of the femoral component.  Original Report Authenticated By: ERIC A. MANSELL, M.D.   Dg Femur Left  05/27/2012  *RADIOLOGY REPORT*  Clinical Data: Left leg pain.  No known injury.  LEFT FEMUR - 2 VIEW  Comparison: Left hip radiographs obtained at the same time.  Findings: Left total hip prosthesis.  Transverse fracture of the femoral shaft at the level of the distal aspect of the femoral component of the prosthesis.  There is a one third two one half shaft width of  lateral displacement of the distal fragment as well as posterior angulation of the distal fragment.  There is periprosthetic lucency in the distal portion of the proximal fragment with a thin anterior cortex along the anterior margin of the distal portion of the prosthetic component.  Diffuse osteopenia is noted as well as arterial calcifications.  IMPRESSION: Left femoral shaft fracture, as described above.  Original Report Authenticated By: Darrol Angel, M.D.    Scheduled Meds:    . budesonide-formoterol  2 puff Inhalation BID  . calcium-vitamin D  1 tablet Oral BID  . cloNIDine  0.2 mg Oral BID  . docusate sodium  100 mg Oral BID  . enoxaparin (LOVENOX) injection  30 mg Subcutaneous Q24H  . famotidine  40 mg Oral QHS  . feeding supplement  237 mL Oral BID  BM  . ferrous sulfate  325 mg Oral TID PC  . furosemide  40 mg Oral Daily  . hydrALAZINE  10 mg Oral TID  . metoprolol tartrate  25 mg Oral BID  . multivitamin with minerals  1 tablet Oral Daily  . potassium chloride SA  20 mEq Oral BID  . senna  1 tablet Oral BID  . tiotropium  18 mcg Inhalation Daily   Continuous Infusions:    . 0.9 % NaCl with KCl 20 mEq / L 20 mL/hr (05/29/12 1606)     Time spent: >20 minutes   Jakyrah Holladay  [facility] Pager [phone]. If 8PM-8AM, please contact night-coverage at [email or web address], password [facility] 06/01/2012, 3:26 PM  LOS: 5 days

## 2012-06-01 NOTE — Progress Notes (Signed)
Physical Therapy Treatment Patient Details Name: [name] MRN: [id number] DOB: 1940-12-18 Today's Date: 06/01/2012 Time: 4098-1191 PT Time Calculation (min): 24 min  PT Assessment / Plan / Recommendation Comments on Treatment Session  Pt doing well with bed mobiltiy, however requires +2 for OOB due to pt very uncoordinated.      Follow Up Recommendations  Inpatient Rehab;Skilled nursing facility    Barriers to Discharge        Equipment Recommendations  Defer to next venue    Recommendations for Other Services    Frequency Min 4X/week   Plan Discharge plan remains appropriate;Frequency remains appropriate    Precautions / Restrictions Precautions Precautions: Fall;Posterior Hip Precaution Comments: Able to recall 2/3 and WB status, educated on all three Restrictions Weight Bearing Restrictions: Yes LLE Weight Bearing: Partial weight bearing LLE Partial Weight Bearing Percentage or Pounds: 25-50%   Pertinent Vitals/Pain 4/10    Mobility  Bed Mobility Bed Mobility: Supine to Sit;Sitting - Scoot to Edge of Bed Supine to Sit: 4: Min assist Sitting - Scoot to Edge of Bed: 5: Supervision Details for Bed Mobility Assistance: Assist for LLE off of bed with cues for hand placement.  Pt doing well using UE to self assist trunk.  Transfers Transfers: Sit to Stand;Stand to Sit Sit to Stand: 1: +2 Total assist;With upper extremity assist;From bed;From chair/3-in-1 Sit to Stand: Patient Percentage: 60% Stand to Sit: 1: +2 Total assist;With armrests;To chair/3-in-1 Stand to Sit: Patient Percentage: 70% Stand Pivot Transfers: 1: +2 Total assist Stand Pivot Transfers: Patient Percentage: 60% Details for Transfer Assistance: Assist to rise with cuing for proper hand placement when sitting/standing.  Ambulation/Gait Ambulation/Gait Assistance: 1: +2 Total assist Ambulation/Gait: Patient Percentage: 60% Ambulation Distance (Feet): 5 Feet Assistive device: Rolling  walker Ambulation/Gait Assistance Details: Took some steps from bed to 3in1 then to recliner.  Pt has very good UE strength, however is very uncoordinated and requires assist for upright posture due to tendency to lean forward and assist for RW placement.  Also note that pt using TTWB for pain Gait Pattern: Step-to pattern;Decreased stance time - left;Antalgic Gait velocity: slow    Exercises     PT Diagnosis:    PT Problem List:   PT Treatment Interventions:     PT Goals Acute Rehab PT Goals PT Goal Formulation: With patient Time For Goal Achievement: 06/12/12 Potential to Achieve Goals: Good Pt will go Supine/Side to Sit: with supervision PT Goal: Supine/Side to Sit - Progress: Progressing toward goal Pt will go Sit to Stand: with supervision PT Goal: Sit to Stand - Progress: Progressing toward goal Pt will go Stand to Sit: with supervision PT Goal: Stand to Sit - Progress: Progressing toward goal Pt will Ambulate: 51 - 150 feet;with rolling walker PT Goal: Ambulate - Progress: Progressing toward goal Additional Goals Additional Goal #1: demo/state 3/3 posterior hip precautions PT Goal: Additional Goal #1 - Progress: Progressing toward goal  Visit Information  Last PT Received On: 06/01/12 Assistance Needed: +2    Subjective Data  Subjective: I know I need to get up  but I don't want to Patient Stated Goal: To get back to walking.   Cognition  Overall Cognitive Status: Appears within functional limits for tasks assessed/performed Arousal/Alertness: Awake/alert Orientation Level: Appears intact for tasks assessed Behavior During Session: [facility] for tasks performed    Balance     End of Session PT - End of Session Activity Tolerance: Patient limited by fatigue;Patient limited by pain Patient left: in  chair;with call bell/phone within reach Nurse Communication: Mobility status   GP     Page, Meribeth Mattes 06/01/2012, 1:16 PM

## 2012-06-01 NOTE — Progress Notes (Signed)
CSW spoke with daughter )[name] Case [phone]) concerning chosen SNF facility. Pt and family have accepted the bed offer for [facility].   CSW contacted facility to accept bed offer.  Weekday CSW to f/u with Monday d/c.   Leron Croak, LCSWA [employer] [phone]

## 2012-06-02 ENCOUNTER — Inpatient Hospital Stay (HOSPITAL_COMMUNITY): Payer: Medicare Other | Admitting: Occupational Therapy

## 2012-06-02 ENCOUNTER — Inpatient Hospital Stay (HOSPITAL_COMMUNITY): Payer: Medicare Other | Admitting: Physical Therapy

## 2012-06-02 ENCOUNTER — Encounter (HOSPITAL_COMMUNITY): Payer: Medicare Other | Admitting: Occupational Therapy

## 2012-06-02 LAB — TYPE AND SCREEN: Unit division: 0

## 2012-06-02 MED ORDER — BISACODYL 10 MG RE SUPP: 10.0000 mg | Freq: Every day | RECTAL | Status: AC | PRN

## 2012-06-02 MED ORDER — CALCIUM CARBONATE-VITAMIN D 500-200 MG-UNIT PO TABS: 1.0000 | ORAL_TABLET | Freq: Two times a day (BID) | ORAL | Status: AC

## 2012-06-02 MED ORDER — TRAMADOL HCL 50 MG PO TABS: 50.0000 mg | ORAL_TABLET | Freq: Four times a day (QID) | ORAL | Status: AC | PRN

## 2012-06-02 MED ORDER — ENSURE COMPLETE PO LIQD: 237.0000 mL | Freq: Two times a day (BID) | ORAL | Status: AC

## 2012-06-02 MED ORDER — FERROUS SULFATE 325 (65 FE) MG PO TABS: 325.0000 mg | ORAL_TABLET | Freq: Three times a day (TID) | ORAL | Status: DC

## 2012-06-02 MED ORDER — POLYETHYLENE GLYCOL 3350 17 G PO PACK: 17.0000 g | PACK | Freq: Every day | ORAL | Status: AC | PRN

## 2012-06-02 MED ORDER — ASPIRIN EC 325 MG PO TBEC: 325.0000 mg | DELAYED_RELEASE_TABLET | Freq: Every day | ORAL | Status: AC

## 2012-06-02 MED ORDER — ENOXAPARIN SODIUM 30 MG/0.3ML ~~LOC~~ SOLN: 30.0000 mg | SUBCUTANEOUS | Status: DC

## 2012-06-02 MED ORDER — OXYCODONE HCL 5 MG PO TABS: 5.0000 mg | ORAL_TABLET | ORAL | Status: AC | PRN

## 2012-06-02 MED ORDER — DSS 100 MG PO CAPS: 100.0000 mg | ORAL_CAPSULE | Freq: Two times a day (BID) | ORAL | Status: AC

## 2012-06-02 NOTE — Progress Notes (Signed)
Patient has a bed at [facility] for discharge when medically ready. CSW text paged Dr. Gwenlyn Perking to make him aware. Anticipating discharge today.   Unice Bailey, LCSW [facility] Clinical Social Worker cell #: [phone]

## 2012-06-02 NOTE — Progress Notes (Signed)
Report called to [facility]. Discharge instructions and dressing care reviewed. All questions answered. Julio Sicks RN

## 2012-06-02 NOTE — Progress Notes (Signed)
Prescriptions found at nursing station after pt was picked up and taken to [facility]. [name] called and they stated that they had already got it worked out. Julio Sicks RN

## 2012-06-02 NOTE — Progress Notes (Signed)
Patient is set to discharge to [facility] SNF today. Patient & daughter, [name] made aware. [facility] called for transport.   Clinical Social Work Department CLINICAL SOCIAL WORK PLACEMENT NOTE 06/02/2012  Patient:  [name], [name]  Account Number:  [id number] Admit date:  05/27/2012  Clinical Social Worker:  Orpah Greek  Date/time:  05/29/2012 01:00 PM  Clinical Social Work is seeking post-discharge placement for this patient at the following level of care:   SKILLED NURSING   (*CSW will update this form in Epic as items are completed)   05/29/2012  Patient/family provided with [facility] Department of Clinical Social Work's list of facilities offering this level of care within the geographic area requested by the patient (or if unable, by the patient's family).  05/29/2012  Patient/family informed of their freedom to choose among providers that offer the needed level of care, that participate in Medicare, Medicaid or managed care program needed by the patient, have an available bed and are willing to accept the patient.  05/29/2012  Patient/family informed of [facility]' ownership interest in [facility], as well as of the fact that they are under no obligation to receive care at this facility.  PASARR submitted to [employer] on 05/29/2012 PASARR number received from [employer] on 05/29/2012  FL2 transmitted to all facilities in geographic area requested by pt/family on  05/29/2012 FL2 transmitted to all facilities within larger geographic area on   Patient informed that his/her managed care company has contracts with or will negotiate with  certain facilities, including the following:     Patient/family informed of bed offers received:  05/31/2012 Patient chooses bed at [facility] Physician recommends and patient chooses bed at    Patient to be transferred to [facility] on  06/02/2012 Patient to be transferred to facility by [facility]  The following  physician request were entered in Epic:   Additional Comments:    Unice Bailey, LCSW [facility] Clinical Social Worker cell #: [phone]

## 2012-06-02 NOTE — Discharge Summary (Signed)
Physician Discharge Summary  [name] ZOX:[id number] DOB: 06/08/1941 DOA: 05/27/2012  PCP: No primary provider on file.  Admit date: 05/27/2012 Discharge date: 06/02/2012  Recommendations for Outpatient Follow-up:  1. PCP follow up in 2 weeks (follow Hgb level with another CBC; reassess chronic conditions and make any needed adjustments to her medication regimen). 2. Follow with orthopedics Service as instructed (follow discharge instructions and wound care).   Discharge Diagnoses:  Active Problems:  Hip fracture, left  HTN (hypertension)  COPD (chronic obstructive pulmonary disease)  History of rectal cancer  Malnutrition of moderate degree  Postop Hyponatremia  Postop Acute blood loss anemia   Discharge Condition: discharge in stable and improved condition; no CP, no SOB and well controlled pain on her Left hip.  Diet recommendation: Heart healthy diet  Wt Readings from Last 3 Encounters:  05/29/12 39.4 kg (86 lb 13.8 oz)  05/29/12 39.4 kg (86 lb 13.8 oz)    History of present illness:  71 year old female presents to the emergency with leg pain that started approximately 2 hours before coming to the ER.The patient denies any trauma and cannot identify a mechanism of injury  She was found to have a Transverse fracture of the femoral shaft at the level of the distal aspect of the femoral component of the prosthesis.    Hospital Course:  1-Hip fracture, left: per ortho patient is ready to be discharge when bed a SNF available; patient w/o significant pain after surgery. Will discharge with instructions for changing dressing daily; lovenox for DVT (X 10 days; then change to ASA for 4 more weeks). Patient denied by insurance for CIR. At this moment patient's family members have accepted bed for [facility] skilled nursing facility; will arrange discharge to facility and she will follow with orthopedic service in 2 weeks.   2-HTN (hypertension): stable and well controlled;  continue current regimen.   3-COPD (chronic obstructive pulmonary disease): no wheezing; and w/o SOB. Continue symbicort.   4-Malnutrition of moderate degree: encourage PO intake, continue daily MV and ensure.   5-Postop Acute blood loss anemia: Hgb 10.2 after 2 units of PRBC's transfused. No acute bleeding appreciated. Patient denies any chest pain or shortness of breath at discharge. She will take ferrous sulfate. Hgb to be repeated by PCP during follow up.  6-osteopenia: Continue vitamin D and calcium.   7-DVT: lovenox to complete 10 days; the transition to full ASA for 4 more weeks daily. .   Procedures:  Total Left hip revision and ORIF  Consultations:  Orthopedics  Discharge Exam: Filed Vitals:   06/02/12 1012  BP:   Pulse: 76  Temp:   Resp:    Filed Vitals:   06/02/12 0530 06/02/12 0948 06/02/12 1011 06/02/12 1012  BP: 146/65  122/65   Pulse: 60   76  Temp: 98.6 F (37 C)     TempSrc: Oral     Resp: 18     Height:      Weight:      SpO2: 97% 95%      General: NAD Cardiovascular: S1 and S2; no rubs or gallops Respiratory: CTA Abdomen: soft, NT, ND; +BS Extremities: dressing on left hip in place; wound clean, no erythema and no drainage.  Discharge Instructions  Discharge Orders    Future Orders Please Complete By Expires   Increase activity slowly      Discharge instructions      Comments:   -Take medications as prescribed -Follow up with Dr. Berton Lan in  2 weeks -Keep patient well hydrated -Follow a low sodium heart healthy diet. -follow up with PCP in 2 weeks   Other Restrictions      Comments:   Partial weight bearing on her LLE after hip repair (25-35%)   Change dressing (specify)      Comments:   Dressing change: every 24 hours.     Medication List  As of 06/02/2012  2:31 PM   TAKE these medications         acetaminophen 650 MG CR tablet   Commonly known as: TYLENOL   Take 650 mg by mouth 2 (two) times daily as needed. For pain       aspirin EC 325 MG tablet   Take 1 tablet (325 mg total) by mouth daily.   Start taking on: 06/09/2012      bisacodyl 10 MG suppository   Commonly known as: DULCOLAX   Place 1 suppository (10 mg total) rectally daily as needed.      budesonide-formoterol 160-4.5 MCG/ACT inhaler   Commonly known as: SYMBICORT   Inhale 2 puffs into the lungs 2 (two) times daily.      calcium-vitamin D 500-200 MG-UNIT per tablet   Commonly known as: OSCAL WITH D   Take 1 tablet by mouth 2 (two) times daily.      cloNIDine 0.2 MG tablet   Commonly known as: CATAPRES   Take 0.2 mg by mouth 2 (two) times daily.      DSS 100 MG Caps   Take 100 mg by mouth 2 (two) times daily.      enoxaparin 30 MG/0.3ML injection   Commonly known as: LOVENOX   Inject 0.3 mLs (30 mg total) into the skin daily.      famotidine 40 MG tablet   Commonly known as: PEPCID   Take 40 mg by mouth at bedtime.      feeding supplement Liqd   Take 237 mLs by mouth 2 (two) times daily between meals.      ferrous sulfate 325 (65 FE) MG tablet   Take 1 tablet (325 mg total) by mouth 3 (three) times daily after meals.      furosemide 40 MG tablet   Commonly known as: LASIX   Take 40 mg by mouth daily.      hydrALAZINE 10 MG tablet   Commonly known as: APRESOLINE   Take 10 mg by mouth 3 (three) times daily.      metoprolol tartrate 25 MG tablet   Commonly known as: LOPRESSOR   Take 25 mg by mouth 2 (two) times daily.      multivitamin with minerals Tabs   Take 1 tablet by mouth daily.      oxyCODONE 5 MG immediate release tablet   Commonly known as: Oxy IR/ROXICODONE   Take 1-2 tablets (5-10 mg total) by mouth every 3 (three) hours as needed (severe pain no relieved by tramadol).      polyethylene glycol packet   Commonly known as: MIRALAX / GLYCOLAX   Take 17 g by mouth daily as needed. Hold for diarrhea      potassium chloride SA 20 MEQ tablet   Commonly known as: K-DUR,KLOR-CON   Take 20 mEq by mouth 2 (two)  times daily.      tiotropium 18 MCG inhalation capsule   Commonly known as: SPIRIVA   Place 18 mcg into inhaler and inhale daily.      traMADol 50 MG tablet   Commonly known  as: ULTRAM   Take 1-2 tablets (50-100 mg total) by mouth every 6 (six) hours as needed for pain.           Follow-up Information    Schedule an appointment as soon as possible for a visit with Loanne Drilling, MD. (Please have SNF call and make appointment for middle of next week on either Turesday  8/13 or Thursday 8/15. )    Contact information:   [facility] [address] [place] [address] [phone]           The results of significant diagnostics from this hospitalization (including imaging, microbiology, ancillary and laboratory) are listed below for reference.    Significant Diagnostic Studies: Dg Chest 1 View  05/27/2012  *RADIOLOGY REPORT*  Clinical Data: Left femur fracture.  CHEST - 1 VIEW  Comparison: 06/08/2011.  Findings: The heart remains normal in size.  The lungs remain hyperexpanded with prominent central pulmonary arteries.  Minimal scoliosis.  Diffuse osteopenia.  IMPRESSION: Stable changes of COPD with evidence of pulmonary arterial hypertension.  No acute abnormality.  Original Report Authenticated By: Darrol Angel, M.D.   Dg Hip Complete Left  05/27/2012  *RADIOLOGY REPORT*  Clinical Data: Pain  LEFT HIP - COMPLETE 2+ VIEW  Comparison: No comparison studies available.  Findings: Left total hip replacement noted in this patient with marked diffuse bony demineralization.  Overlying metallic support device/brace is evident.  Periprosthetic femur fracture is seen on correlative femur films performed at the same time, but is not visualized on this left hip study.  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  IMPRESSION: Status post left total hip replacement.  The femoral component remains located in the acetabular  cup.  Femur films performed at the same time as this study demonstrate a periprosthetic femur fracture at the tip of the femoral component.  Original Report Authenticated By: ERIC A. MANSELL, M.D.   Dg Femur Left  05/27/2012  *RADIOLOGY REPORT*  Clinical Data: Left leg pain.  No known injury.  LEFT FEMUR - 2 VIEW  Comparison: Left hip radiographs obtained at the same time.  Findings: Left total hip prosthesis.  Transverse fracture of the femoral shaft at the level of the distal aspect of the femoral component of the prosthesis.  There is a one third two one half shaft width of lateral displacement of the distal fragment as well as posterior angulation of the distal fragment.  There is periprosthetic lucency in the distal portion of the proximal fragment with a thin anterior cortex along the anterior margin of the distal portion of the prosthetic component.  Diffuse osteopenia is noted as well as arterial calcifications.  IMPRESSION: Left femoral shaft fracture, as described above.  Original Report Authenticated By: Darrol Angel, M.D.   Dg Pelvis Portable  05/28/2012  *RADIOLOGY REPORT*  Clinical Data: Postoperative radiograph.  Left hip operation.  PORTABLE PELVIS  Comparison: 06/05/2011 CT.  05/27/2012 radiograph.  Findings: In conjunction with the left hip radiograph, there is a new left femoral stem with lateral rib forcing plate and encircling cables.  Expected postsurgical changes are present in the soft tissues.  Chronic osteolysis around the proximal femur.  Improved alignment of the periprosthetic fracture around the femoral stem. 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  IMPRESSION: New left femoral prosthesis with lateral rib forcing plate and encircling cables.  Original Report Authenticated By: Andreas Newport, M.D.   Dg Hip  Portable 1 View Left  05/28/2012  *RADIOLOGY REPORT*  Clinical Data: Left hip surgery.  PORTABLE LEFT HIP - 1 VIEW  Comparison:  05/27/2012.  Findings: New left femoral component is present with lateral rib forcing plate encircling cables.  Periprosthetic fracture from prior left femoral stem is again noted with mild step-off deformity along the medial aspect of the mid left femoral diaphysis. Expected postsurgical changes in the soft tissues.  IMPRESSION: Revision of left total hip arthroplasty with new left femoral stem and reinforcing lateral plate with encircling cables.  Original Report Authenticated By: Andreas Newport, M.D.    Microbiology: Recent Results (from the past 240 hour(s))  SURGICAL PCR SCREEN     Status: Abnormal   Collection Time   05/28/12  2:19 PM      Component Value Range Status Comment   MRSA, PCR NEGATIVE  NEGATIVE Final    Staphylococcus aureus POSITIVE (*) NEGATIVE Final      Labs: Basic Metabolic Panel:  Lab 05/30/12 9147 05/29/12 0425 05/28/12 0500 05/27/12 1536 05/27/12 1143  NA 134* 134* 138 137 139  K 4.1 3.9 3.8 3.3* 3.0*  CL 101 99 101 102 102  CO2 26 23 23 22 25   GLUCOSE 137* 140* 80 109* 108*  BUN 24* 21 13 8 8   CREATININE 0.74 0.75 0.93 0.57 0.66  CALCIUM 8.9 8.6 9.3 9.2 9.3  MG -- -- -- -- --  PHOS -- -- -- -- --   Liver Function Tests:  Lab 05/27/12 1536  AST 33  ALT 12  ALKPHOS 151*  BILITOT 0.5  PROT 7.1  ALBUMIN 3.3*   CBC:  Lab 06/01/12 0730 05/31/12 0845 05/30/12 0450 05/29/12 0425 05/28/12 0500 05/27/12 1143  WBC 8.0 8.0 11.5* 7.8 6.4 --  NEUTROABS -- -- -- -- -- 5.3  HGB 10.2* 10.6* 8.3* 9.8* 11.7* --  HCT 30.3* 30.8* 24.6* 29.3* 35.8* --  MCV 90.2 88.8 90.4 92.1 92.5 --  PLT 154 137* 192 194 164 --   Time coordinating discharge: >30  minutes  Signed:  Niani Mourer  [facility] 06/02/2012, 2:31 PM

## 2012-06-02 NOTE — Progress Notes (Signed)
   Subjective: 5 Days Post-Op Procedure(s) (LRB): OPEN REDUCTION INTERNAL FIXATION (ORIF) PERIPROSTHETIC FRACTURE (Left) TOTAL HIP REVISION (Left) Patient is resting well.  Awakens easily.  Appears to be in no distress.  Smiling and laughing during visit.     Patient seen in rounds for Dr. Lequita Halt. Patient is well, and has had no acute complaints or problems Plan is to go Skilled nursing facility after hospital stay.  Objective: Vital signs in last 24 hours: Temp:  [98 F (36.7 C)-98.6 F (37 C)] 98.6 F (37 C) (08/05 0530) Pulse Rate:  [60-76] 60  (08/05 0530) Resp:  [18-20] 18  (08/05 0530) BP: (109-146)/(58-65) 146/65 mmHg (08/05 0530) SpO2:  [93 %-97 %] 97 % (08/05 0530)  Intake/Output from previous day:  Intake/Output Summary (Last 24 hours) at 06/02/12 0810 Last data filed at 06/01/12 2348  Gross per 24 hour  Intake    600 ml  Output    600 ml  Net      0 ml    Labs:  Basename 06/01/12 0730 05/31/12 0845  HGB 10.2* 10.6*    Basename 06/01/12 0730 05/31/12 0845  WBC 8.0 8.0  RBC 3.36* 3.47*  HCT 30.3* 30.8*  PLT 154 137*   No results found for this basename: NA:2,K:2,CL:2,CO2:2,BUN:2,CREATININE:2,GLUCOSE:2,CALCIUM:2 in the last 72 hours No results found for this basename: LABPT:2,INR:2 in the last 72 hours  EXAM General - Patient is Alert, Appropriate and Oriented Extremity - Neurovascular intact Sensation intact distally Dorsiflexion/Plantar flexion intact No cellulitis present Dressing/Incision - clean, dry, no drainage, healing, staples are intact Motor Function - intact, moving foot and toes well on exam.   Past Medical History  Diagnosis Date  . COPD (chronic obstructive pulmonary disease)   . Rectal adenocarcinoma   . Lower GI bleed     Assessment/Plan: 5 Days Post-Op Procedure(s) (LRB): OPEN REDUCTION INTERNAL FIXATION (ORIF) PERIPROSTHETIC FRACTURE (Left) TOTAL HIP REVISION (Left) Active Problems:  Hip fracture, left  HTN  (hypertension)  COPD (chronic obstructive pulmonary disease)  History of rectal cancer  Malnutrition of moderate degree  Postop Hyponatremia  Postop Acute blood loss anemia   Discharge to SNF Hip Preacutions  DVT Prophylaxis - Lovenox for a total of two weeks and than switch over to aspirin for four more weeks if able to tolerate. Partial Weight Bearing 25-50% left Leg  Suman Trivedi 06/02/2012, 8:10 AM

## 2012-06-04 LAB — VITAMIN D 1,25 DIHYDROXY
Vitamin D 1, 25 (OH)2 Total: 48 pg/mL (ref 18–72)
Vitamin D3 1, 25 (OH)2: 48 pg/mL

## 2012-06-05 ENCOUNTER — Encounter (HOSPITAL_COMMUNITY): Payer: Self-pay | Admitting: Orthopedic Surgery

## 2013-09-01 ENCOUNTER — Other Ambulatory Visit: Payer: Self-pay | Admitting: Family Medicine

## 2013-09-01 DIAGNOSIS — Z1231 Encounter for screening mammogram for malignant neoplasm of breast: Secondary | ICD-10-CM

## 2013-09-21 ENCOUNTER — Ambulatory Visit: Payer: Medicare Other

## 2013-10-05 ENCOUNTER — Ambulatory Visit: Payer: Medicare Other

## 2013-12-15 ENCOUNTER — Emergency Department (HOSPITAL_COMMUNITY): Payer: Medicare Other

## 2013-12-15 ENCOUNTER — Encounter (HOSPITAL_COMMUNITY): Payer: Self-pay | Admitting: Emergency Medicine

## 2013-12-15 ENCOUNTER — Emergency Department (HOSPITAL_COMMUNITY)
Admission: EM | Admit: 2013-12-15 | Discharge: 2013-12-15 | Disposition: A | Discharge status: Home / Self Care | Payer: Medicare Other | Attending: Emergency Medicine | Admitting: Emergency Medicine

## 2013-12-15 DIAGNOSIS — J449 Chronic obstructive pulmonary disease, unspecified: Secondary | ICD-10-CM | POA: Insufficient documentation

## 2013-12-15 DIAGNOSIS — F0390 Unspecified dementia without behavioral disturbance: Secondary | ICD-10-CM | POA: Insufficient documentation

## 2013-12-15 DIAGNOSIS — J4489 Other specified chronic obstructive pulmonary disease: Secondary | ICD-10-CM | POA: Insufficient documentation

## 2013-12-15 DIAGNOSIS — M25559 Pain in unspecified hip: Secondary | ICD-10-CM | POA: Insufficient documentation

## 2013-12-15 DIAGNOSIS — Z87891 Personal history of nicotine dependence: Secondary | ICD-10-CM | POA: Insufficient documentation

## 2013-12-15 DIAGNOSIS — M25551 Pain in right hip: Secondary | ICD-10-CM

## 2013-12-15 DIAGNOSIS — Z85048 Personal history of other malignant neoplasm of rectum, rectosigmoid junction, and anus: Secondary | ICD-10-CM | POA: Insufficient documentation

## 2013-12-15 DIAGNOSIS — Z8719 Personal history of other diseases of the digestive system: Secondary | ICD-10-CM | POA: Insufficient documentation

## 2013-12-15 DIAGNOSIS — Z79899 Other long term (current) drug therapy: Secondary | ICD-10-CM | POA: Insufficient documentation

## 2013-12-15 LAB — BASIC METABOLIC PANEL
BUN: 26 mg/dL — AB (ref 6–23)
CHLORIDE: 100 meq/L (ref 96–112)
CO2: 24 meq/L (ref 19–32)
Calcium: 9.5 mg/dL (ref 8.4–10.5)
Creatinine, Ser: 1.07 mg/dL (ref 0.50–1.10)
GFR calc Af Amer: 58 mL/min — ABNORMAL LOW (ref >90)
GFR calc non Af Amer: 50 mL/min — ABNORMAL LOW (ref >90)
GLUCOSE: 97 mg/dL (ref 70–99)
POTASSIUM: 4 meq/L (ref 3.7–5.3)
Sodium: 141 mEq/L (ref 137–147)

## 2013-12-15 LAB — CBC WITH DIFFERENTIAL/PLATELET
BASOS PCT: 0 % (ref 0–1)
Basophils Absolute: 0 10*3/uL (ref 0.0–0.1)
EOS PCT: 0 % (ref 0–5)
Eosinophils Absolute: 0 10*3/uL (ref 0.0–0.7)
HEMATOCRIT: 43.4 % (ref 36.0–46.0)
HEMOGLOBIN: 15.2 g/dL — AB (ref 12.0–15.0)
Lymphocytes Relative: 6 % — ABNORMAL LOW (ref 12–46)
Lymphs Abs: 0.5 10*3/uL — ABNORMAL LOW (ref 0.7–4.0)
MCH: 32.8 pg (ref 26.0–34.0)
MCHC: 35 g/dL (ref 30.0–36.0)
MCV: 93.5 fL (ref 78.0–100.0)
MONO ABS: 0.6 10*3/uL (ref 0.1–1.0)
MONOS PCT: 6 % (ref 3–12)
NEUTROS ABS: 8.4 10*3/uL — AB (ref 1.7–7.7)
Neutrophils Relative %: 88 % — ABNORMAL HIGH (ref 43–77)
Platelets: 126 10*3/uL — ABNORMAL LOW (ref 150–400)
RBC: 4.64 MIL/uL (ref 3.87–5.11)
RDW: 14.6 % (ref 11.5–15.5)
WBC: 9.5 10*3/uL (ref 4.0–10.5)

## 2013-12-15 NOTE — ED Notes (Signed)
According to EMS, patient was moving a heavy box and she turned and felt something "pop" behind her knee.  She advises she has no pain to her knee but she is complaining of right hip pain, rates it 4/10.  Patient was transported here to be evaluated.

## 2013-12-15 NOTE — ED Provider Notes (Signed)
CSN: [id number]     Arrival date & time 12/15/13  1151 History   First MD Initiated Contact with Patient 12/15/13 1214     Chief Complaint  Patient presents with  . Hip Pain    right hip pain.     (Consider location/radiation/quality/duration/timing/severity/associated sxs/prior Treatment) HPI.... level V caveat for mild dementia.   Complains of sharp right hip pain for 24 hours. Possibly twisted her hip unexpectedly. Nursing notes say knee pain but she has no complaints today of same.  Daughter states long-standing right hip problems.  Severity is mild to moderate.   Positioning makes symptoms worse  Past Medical History  Diagnosis Date  . COPD (chronic obstructive pulmonary disease)   . Rectal adenocarcinoma   . Lower GI bleed    Past Surgical History  Procedure Laterality Date  . Hip arthroplasty    . Colonoscopy    . Rectal surgery    . Orif periprosthetic fracture  05/28/2012    Procedure: OPEN REDUCTION INTERNAL FIXATION (ORIF) PERIPROSTHETIC FRACTURE;  Surgeon: Gearlean Alf, MD;  Location: [facility];  Service: Orthopedics;  Laterality: Left;  . Total hip revision  05/28/2012    Procedure: TOTAL HIP REVISION;  Surgeon: Gearlean Alf, MD;  Location: [facility];  Service: Orthopedics;  Laterality: Left;  TOTAL HIP ARTHROPLASTY/FEMORAL FRACTURE   History reviewed. No pertinent family history. History  Substance Use Topics  . Smoking status: Former Smoker    Types: Cigarettes    Quit date: 03/27/2010  . Smokeless tobacco: Not on file  . Alcohol Use:    OB History   Grav Para Term Preterm Abortions TAB SAB Ect Mult Living                 Review of Systems  All other systems reviewed and are negative.      Allergies  Morphine and related and Ciprofloxacin  Home Medications   Current Outpatient Rx  Name  Route  Sig  Dispense  Refill  . acetaminophen (TYLENOL) 650 MG CR tablet   Oral   Take 650 mg by mouth 2 (two) times daily as needed. For pain         .  budesonide-formoterol (SYMBICORT) 160-4.5 MCG/ACT inhaler   Inhalation   Inhale 2 puffs into the lungs 2 (two) times daily.         . feeding supplement (ENSURE COMPLETE) LIQD   Oral   Take 237 mLs by mouth 2 (two) times daily between meals.         . hydrALAZINE (APRESOLINE) 10 MG tablet   Oral   Take 10 mg by mouth 3 (three) times daily.         Marland Kitchen lisinopril (PRINIVIL,ZESTRIL) 10 MG tablet   Oral   Take 10 mg by mouth daily.         . metoprolol tartrate (LOPRESSOR) 25 MG tablet   Oral   Take 25 mg by mouth 2 (two) times daily.         . Multiple Vitamin (MULTIVITAMIN WITH MINERALS) TABS   Oral   Take 1 tablet by mouth daily.         . potassium chloride SA (K-DUR,KLOR-CON) 20 MEQ tablet   Oral   Take 20 mEq by mouth 2 (two) times daily.         Marland Kitchen tiotropium (SPIRIVA) 18 MCG inhalation capsule   Inhalation   Place 18 mcg into inhaler and inhale daily.         Marland Kitchen  EXPIRED: calcium-vitamin D (OSCAL WITH D) 500-200 MG-UNIT per tablet   Oral   Take 1 tablet by mouth 2 (two) times daily.         Marland Kitchen EXPIRED: enoxaparin (LOVENOX) 30 MG/0.3ML injection   Subcutaneous   Inject 0.3 mLs (30 mg total) into the skin daily.   0 Syringe      . EXPIRED: ferrous sulfate 325 (65 FE) MG tablet   Oral   Take 1 tablet (325 mg total) by mouth 3 (three) times daily after meals.          BP 208/87  Pulse 103  Temp(Src) 97.7 F (36.5 C) (Oral)  Resp 22  SpO2 93% Physical Exam  Nursing note and vitals reviewed. Constitutional:  Mild dementia  HENT:  Head: Normocephalic and atraumatic.  Eyes: Conjunctivae and EOM are normal. Pupils are equal, round, and reactive to light.  Neck: Normal range of motion. Neck supple.  Cardiovascular: Normal rate, regular rhythm and normal heart sounds.   Pulmonary/Chest: Effort normal and breath sounds normal.  Abdominal: Soft. Bowel sounds are normal.  Musculoskeletal:  Tender right posterior lateral hip.  Pain with range of  motion  Neurological: She is alert.  Skin: Skin is warm and dry.  Psychiatric:  Flat affect    ED Course  Procedures (including critical care time) Labs Review Labs Reviewed  BASIC METABOLIC PANEL - Abnormal; Notable for the following:    BUN 26 (*)    GFR calc non Af Amer 50 (*)    GFR calc Af Amer 58 (*)    All other components within normal limits  CBC WITH DIFFERENTIAL - Abnormal; Notable for the following:    Hemoglobin 15.2 (*)    Platelets 126 (*)    Neutrophils Relative % 88 (*)    Neutro Abs 8.4 (*)    Lymphocytes Relative 6 (*)    Lymphs Abs 0.5 (*)    All other components within normal limits   Imaging Review Dg Hip Complete Right  12/15/2013   CLINICAL DATA:  Right hip pain.  EXAM: RIGHT HIP - COMPLETE 2+ VIEW  COMPARISON:  Radiographs dated 03/14/2007 and 05/28/2012 and CT scans dated 10/13/2008  FINDINGS: There is chronic complete absence of the right femoral head and neck. The femoral shaft is subluxed proximally. There is a protrusio deformity of the acetabulum of the right hip.  Left total hip prosthesis is in place, unchanged.  IMPRESSION: No acute abnormality. 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   Electronically Signed   By: Rozetta Nunnery M.D.   On: 12/15/2013 13:42   Dg Femur Right  12/15/2013   CLINICAL DATA:  Right hip pain.  EXAM: RIGHT FEMUR - 2 VIEW  COMPARISON:  DG HIP COMPLETE*R* dated 12/15/2013; DG HIP COMPLETE*R* dated 03/14/2007  FINDINGS: The femoral head and neck are not visible and may be surgically absent or have been resorbed This is new since an AP view of the pelvis in May of 2008. There is no normal articulation of the proximal femur with the acetabulum. The femoral shaft appears intact. There is diffuse osteopenia. The observed portions of the knee exhibit no acute abnormalities. There is calcification within the common femoral and superficial  femoral arteries.  IMPRESSION: 1. There is severe chronic deformity of the proximal femur involving the head and neck with no normal articulation with the acetabulum demonstrated. 2. The femoral shaft  appears intact though diffusely osteopenic.   Electronically Signed   By: David  Martinique   On: 12/15/2013 13:41    EKG Interpretation   None       MDM   Final diagnoses:  Right hip pain    Labs and hip x-ray reviewed. Severe chronic deformity of the proximal femur involving the head and neck with no normal articulation in the acetabulum noted.   Discussed findings with patient and her daughter. Daughter is willing to take her home and get orthopedic followup if not improving.    Nat Christen, MD 12/15/13 [phone]

## 2013-12-15 NOTE — ED Notes (Signed)
MD aware of Blood pressure

## 2013-12-15 NOTE — Discharge Instructions (Signed)
Followup with the orthopedic surgeon for continued hip pain.  Blood work was good.  Ice pack to hip area

## 2013-12-29 ENCOUNTER — Other Ambulatory Visit: Payer: Self-pay | Admitting: Family Medicine

## 2013-12-29 ENCOUNTER — Ambulatory Visit
Admission: RE | Admit: 2013-12-29 | Discharge: 2013-12-29 | Disposition: A | Discharge status: Home / Self Care | Payer: Medicare Other | Source: Ambulatory Visit | Attending: Family Medicine | Admitting: Family Medicine

## 2013-12-29 DIAGNOSIS — M25561 Pain in right knee: Secondary | ICD-10-CM

## 2013-12-30 ENCOUNTER — Ambulatory Visit
Admission: RE | Admit: 2013-12-30 | Discharge: 2013-12-30 | Disposition: A | Discharge status: Home / Self Care | Payer: Medicare Other | Source: Ambulatory Visit | Attending: Family Medicine | Admitting: Family Medicine

## 2013-12-30 ENCOUNTER — Other Ambulatory Visit: Payer: Self-pay | Admitting: Family Medicine

## 2013-12-30 DIAGNOSIS — S82143A Displaced bicondylar fracture of unspecified tibia, initial encounter for closed fracture: Secondary | ICD-10-CM

## 2014-10-03 ENCOUNTER — Encounter (HOSPITAL_COMMUNITY): Payer: Self-pay | Admitting: *Deleted

## 2014-10-03 ENCOUNTER — Emergency Department (HOSPITAL_COMMUNITY)
Admission: EM | Admit: 2014-10-03 | Discharge: 2014-10-03 | Disposition: A | Discharge status: Home / Self Care | Payer: Medicare Other | Attending: Emergency Medicine | Admitting: Emergency Medicine

## 2014-10-03 ENCOUNTER — Emergency Department (HOSPITAL_COMMUNITY): Payer: Medicare Other

## 2014-10-03 DIAGNOSIS — Z87891 Personal history of nicotine dependence: Secondary | ICD-10-CM | POA: Insufficient documentation

## 2014-10-03 DIAGNOSIS — J449 Chronic obstructive pulmonary disease, unspecified: Secondary | ICD-10-CM | POA: Insufficient documentation

## 2014-10-03 DIAGNOSIS — Z8719 Personal history of other diseases of the digestive system: Secondary | ICD-10-CM | POA: Insufficient documentation

## 2014-10-03 DIAGNOSIS — S8991XA Unspecified injury of right lower leg, initial encounter: Secondary | ICD-10-CM | POA: Insufficient documentation

## 2014-10-03 DIAGNOSIS — Z7951 Long term (current) use of inhaled steroids: Secondary | ICD-10-CM | POA: Insufficient documentation

## 2014-10-03 DIAGNOSIS — S8001XA Contusion of right knee, initial encounter: Secondary | ICD-10-CM | POA: Insufficient documentation

## 2014-10-03 DIAGNOSIS — Z8781 Personal history of (healed) traumatic fracture: Secondary | ICD-10-CM | POA: Diagnosis not present

## 2014-10-03 DIAGNOSIS — Y9289 Other specified places as the place of occurrence of the external cause: Secondary | ICD-10-CM | POA: Insufficient documentation

## 2014-10-03 DIAGNOSIS — Y9389 Activity, other specified: Secondary | ICD-10-CM | POA: Diagnosis not present

## 2014-10-03 DIAGNOSIS — Y998 Other external cause status: Secondary | ICD-10-CM | POA: Diagnosis not present

## 2014-10-03 DIAGNOSIS — M25561 Pain in right knee: Secondary | ICD-10-CM

## 2014-10-03 DIAGNOSIS — Z79899 Other long term (current) drug therapy: Secondary | ICD-10-CM | POA: Diagnosis not present

## 2014-10-03 DIAGNOSIS — Z85048 Personal history of other malignant neoplasm of rectum, rectosigmoid junction, and anus: Secondary | ICD-10-CM | POA: Diagnosis not present

## 2014-10-03 DIAGNOSIS — W01198A Fall on same level from slipping, tripping and stumbling with subsequent striking against other object, initial encounter: Secondary | ICD-10-CM | POA: Insufficient documentation

## 2014-10-03 NOTE — ED Notes (Signed)
Pt reports slipping and falling down last night, reports right leg when back underneath her when she went down and now has right knee pain.

## 2014-10-03 NOTE — ED Notes (Signed)
The doctor anded res has already seen and   Getting her ready for d/c.  See thir notes.   Alert daughter at the bedside

## 2014-10-03 NOTE — ED Provider Notes (Signed)
I saw and evaluated the patient, reviewed the resident's note and I agree with the findings and plan.   EKG Interpretation None      Results for orders placed or performed during the hospital encounter of 28/31/51  Basic metabolic panel  Result Value Ref Range   Sodium 141 137 - 147 mEq/L   Potassium 4.0 3.7 - 5.3 mEq/L   Chloride 100 96 - 112 mEq/L   CO2 24 19 - 32 mEq/L   Glucose, Bld 97 70 - 99 mg/dL   BUN 26 (H) 6 - 23 mg/dL   Creatinine, Ser 1.07 0.50 - 1.10 mg/dL   Calcium 9.5 8.4 - 10.5 mg/dL   GFR calc non Af Amer 50 (L) >90 mL/min   GFR calc Af Amer 58 (L) >90 mL/min  CBC with Differential  Result Value Ref Range   WBC 9.5 4.0 - 10.5 K/uL   RBC 4.64 3.87 - 5.11 MIL/uL   Hemoglobin 15.2 (H) 12.0 - 15.0 g/dL   HCT 43.4 36.0 - 46.0 %   MCV 93.5 78.0 - 100.0 fL   MCH 32.8 26.0 - 34.0 pg   MCHC 35.0 30.0 - 36.0 g/dL   RDW 14.6 11.5 - 15.5 %   Platelets 126 (L) 150 - 400 K/uL   Neutrophils Relative % 88 (H) 43 - 77 %   Neutro Abs 8.4 (H) 1.7 - 7.7 K/uL   Lymphocytes Relative 6 (L) 12 - 46 %   Lymphs Abs 0.5 (L) 0.7 - 4.0 K/uL   Monocytes Relative 6 3 - 12 %   Monocytes Absolute 0.6 0.1 - 1.0 K/uL   Eosinophils Relative 0 0 - 5 %   Eosinophils Absolute 0.0 0.0 - 0.7 K/uL   Basophils Relative 0 0 - 1 %   Basophils Absolute 0.0 0.0 - 0.1 K/uL   Dg Knee Complete 4 Views Right  10/03/2014   CLINICAL DATA:  73 year old female with history of trauma from a fall in the bathroom yesterday after standing up off the toilet. Pain in the right knee.  EXAM: RIGHT KNEE - COMPLETE 4+ VIEW  COMPARISON:  CT of the right knee 12/30/2013.  FINDINGS: Four views of the right knee demonstrate no acute displaced fracture, subluxation dislocation. Old healed lateral tibial plateau fracture is noted. Osteopenia.  IMPRESSION: 1. No acute radiographic abnormality of the right knee. 2. Osteopenia. 3. Old healed lateral tibial plateau fracture.   Electronically Signed   By: Vinnie Langton M.D.    On: 10/03/2014 17:22    Patient seen by me. Patient slipped and had a fall with injury to the right knee area yesterday. Last night. Sounds as if there was hyper flexion of the knee. No significant infusion is all lot of ecchymosis bruising of the anterior part of the knee below the kneecap. X-rays negative for any fracture patient has an old history of of the tibial plateau fracture that's evident on the x-rays. No new bony injuries. Patient has walker at home. Patient has an orthopedic doctor that they can follow-up with. Patient will be discharged home. Patient with fairly good range of motion of the knee and neurovascular seems to be grossly intact.  Fredia Sorrow, MD 10/03/14 1756

## 2014-10-03 NOTE — ED Provider Notes (Signed)
CSN: [id number]     Arrival date & time 10/03/14  1607 History   First MD Initiated Contact with Patient 10/03/14 1728     Chief Complaint  Patient presents with  . Fall  . Knee Pain     (Consider location/radiation/quality/duration/timing/severity/associated sxs/prior Treatment) Patient is a 73 y.o. female presenting with knee pain.  Knee Pain Location:  Knee Injury: yes   Mechanism of injury: fall   Knee location:  R knee Pain details:    Quality:  Aching   Radiates to:  Does not radiate   Severity:  Mild Chronicity:  New Dislocation: no   Foreign body present:  No foreign bodies Prior injury to area:  Yes Relieved by:  None tried Worsened by:  Nothing tried Ineffective treatments:  None tried Associated symptoms: no back pain, no itching, no neck pain and no numbness     Past Medical History  Diagnosis Date  . COPD (chronic obstructive pulmonary disease)   . Rectal adenocarcinoma   . Lower GI bleed    Past Surgical History  Procedure Laterality Date  . Hip arthroplasty    . Colonoscopy    . Rectal surgery    . Orif periprosthetic fracture  05/28/2012    Procedure: OPEN REDUCTION INTERNAL FIXATION (ORIF) PERIPROSTHETIC FRACTURE;  Surgeon: Gearlean Alf, MD;  Location: [facility];  Service: Orthopedics;  Laterality: Left;  . Total hip revision  05/28/2012    Procedure: TOTAL HIP REVISION;  Surgeon: Gearlean Alf, MD;  Location: [facility];  Service: Orthopedics;  Laterality: Left;  TOTAL HIP ARTHROPLASTY/FEMORAL FRACTURE   History reviewed. No pertinent family history. History  Substance Use Topics  . Smoking status: Former Smoker    Types: Cigarettes    Quit date: 03/27/2010  . Smokeless tobacco: Not on file  . Alcohol Use: Not on file   OB History    No data available     Review of Systems  Respiratory: Negative for cough and shortness of breath.   Cardiovascular: Negative for chest pain and palpitations.  Gastrointestinal: Negative for nausea, vomiting and  diarrhea.  Endocrine: Negative for polydipsia and polyuria.  Musculoskeletal: Negative for back pain, gait problem and neck pain.       Right knee pain  Skin: Negative for itching and wound.  All other systems reviewed and are negative.     Allergies  Morphine and related and Ciprofloxacin  Home Medications   Prior to Admission medications   Medication Sig Start Date End Date Taking? Authorizing Provider  acetaminophen (TYLENOL) 650 MG CR tablet Take 650 mg by mouth 2 (two) times daily as needed. For pain    Historical Provider, MD  budesonide-formoterol (SYMBICORT) 160-4.5 MCG/ACT inhaler Inhale 2 puffs into the lungs 2 (two) times daily.    Historical Provider, MD  calcium-vitamin D (OSCAL WITH D) 500-200 MG-UNIT per tablet Take 1 tablet by mouth 2 (two) times daily. 06/02/12 06/02/13  Barton Dubois, MD  enoxaparin (LOVENOX) 30 MG/0.3ML injection Inject 0.3 mLs (30 mg total) into the skin daily. 06/02/12 06/08/12  Barton Dubois, MD  feeding supplement (ENSURE COMPLETE) LIQD Take 237 mLs by mouth 2 (two) times daily between meals. 06/02/12   Barton Dubois, MD  ferrous sulfate 325 (65 FE) MG tablet Take 1 tablet (325 mg total) by mouth 3 (three) times daily after meals. 06/02/12 06/02/13  Barton Dubois, MD  hydrALAZINE (APRESOLINE) 10 MG tablet Take 10 mg by mouth 3 (three) times daily.    Historical Provider,  MD  lisinopril (PRINIVIL,ZESTRIL) 10 MG tablet Take 10 mg by mouth daily. 12/09/13   Historical Provider, MD  metoprolol tartrate (LOPRESSOR) 25 MG tablet Take 25 mg by mouth 2 (two) times daily.    Historical Provider, MD  Multiple Vitamin (MULTIVITAMIN WITH MINERALS) TABS Take 1 tablet by mouth daily.    Historical Provider, MD  potassium chloride SA (K-DUR,KLOR-CON) 20 MEQ tablet Take 20 mEq by mouth 2 (two) times daily.    Historical Provider, MD  tiotropium (SPIRIVA) 18 MCG inhalation capsule Place 18 mcg into inhaler and inhale daily.    Historical Provider, MD   BP 177/75 mmHg  Pulse 83   Temp(Src) 98.5 F (36.9 C) (Oral)  Resp 18  SpO2 93% Physical Exam  Constitutional: She is oriented to person, place, and time. She appears well-developed and well-nourished.  HENT:  Head: Normocephalic and atraumatic.  Eyes: Conjunctivae and EOM are normal. Right eye exhibits no discharge. Left eye exhibits no discharge.  Cardiovascular: Normal rate and regular rhythm.   Pulmonary/Chest: Effort normal and breath sounds normal. No respiratory distress.  Abdominal: Soft. She exhibits no distension. There is no tenderness. There is no rebound.  Musculoskeletal: Normal range of motion. She exhibits no edema or tenderness.  Neurological: She is alert and oriented to person, place, and time.  Skin: Skin is warm and dry.  Ecchymosis around inferior patella area Knee exam without instabilitiy or ballotable.  Nursing note and vitals reviewed.   ED Course  Procedures (including critical care time) Labs Review Labs Reviewed - No data to display  Imaging Review Dg Knee Complete 4 Views Right  10/03/2014   CLINICAL DATA:  73 year old female with history of trauma from a fall in the bathroom yesterday after standing up off the toilet. Pain in the right knee.  EXAM: RIGHT KNEE - COMPLETE 4+ VIEW  COMPARISON:  CT of the right knee 12/30/2013.  FINDINGS: Four views of the right knee demonstrate no acute displaced fracture, subluxation dislocation. Old healed lateral tibial plateau fracture is noted. Osteopenia.  IMPRESSION: 1. No acute radiographic abnormality of the right knee. 2. Osteopenia. 3. Old healed lateral tibial plateau fracture.   Electronically Signed   By: Vinnie Langton M.D.   On: 10/03/2014 17:22     EKG Interpretation None      MDM   Final diagnoses:  None    73 year old female with a fall last nightassociated symptoms with the fall however landed on her knee with hyper flexion and has had some soreness since that time. Is not attempted weightbearing secondary to  daughter's insistence to get it checked out. On examination here has full range of motion no tenderness to palpation in the significant ecchymosis around the right knee mostly inferior. Patient able to ambulate at baseline per daughter. Took multiple steps in the room while holding my hands as the patient usually uses a walker and not complaining of any severe pain. No large effusion on XR or exam, doubt hemarthrosis with a negative x-ray this is unlikely to be a fracture at this time we'll treat conservatively and symptomatically. They will follow up here if she starts having difficulty walking or she will follow with her PCP in 1 week if she has continued pain for repeat x-ray.   Merrily Pew, MD 10/03/14 [phone]

## 2016-01-30 ENCOUNTER — Emergency Department (HOSPITAL_COMMUNITY): Payer: Medicare Other

## 2016-01-30 ENCOUNTER — Encounter (HOSPITAL_COMMUNITY)
Admission: EM | Disposition: A | Discharge status: Hospice | Payer: Self-pay | Source: Home / Self Care | Attending: Internal Medicine

## 2016-01-30 ENCOUNTER — Ambulatory Visit (HOSPITAL_COMMUNITY): Admission: AD | Admit: 2016-01-30 | Payer: Self-pay | Admitting: Cardiovascular Disease

## 2016-01-30 ENCOUNTER — Inpatient Hospital Stay (HOSPITAL_COMMUNITY)
Admission: EM | Admit: 2016-01-30 | Discharge: 2016-02-05 | DRG: 304 | Disposition: A | Discharge status: Hospice | Payer: Medicare Other | Attending: Internal Medicine | Admitting: Internal Medicine

## 2016-01-30 ENCOUNTER — Encounter (HOSPITAL_COMMUNITY): Payer: Self-pay | Admitting: *Deleted

## 2016-01-30 DIAGNOSIS — E860 Dehydration: Secondary | ICD-10-CM | POA: Diagnosis present

## 2016-01-30 DIAGNOSIS — I248 Other forms of acute ischemic heart disease: Secondary | ICD-10-CM | POA: Diagnosis present

## 2016-01-30 DIAGNOSIS — Z96642 Presence of left artificial hip joint: Secondary | ICD-10-CM | POA: Diagnosis present

## 2016-01-30 DIAGNOSIS — G9341 Metabolic encephalopathy: Secondary | ICD-10-CM | POA: Diagnosis present

## 2016-01-30 DIAGNOSIS — E440 Moderate protein-calorie malnutrition: Secondary | ICD-10-CM | POA: Diagnosis not present

## 2016-01-30 DIAGNOSIS — E785 Hyperlipidemia, unspecified: Secondary | ICD-10-CM | POA: Diagnosis present

## 2016-01-30 DIAGNOSIS — R64 Cachexia: Secondary | ICD-10-CM | POA: Diagnosis present

## 2016-01-30 DIAGNOSIS — Z515 Encounter for palliative care: Secondary | ICD-10-CM | POA: Insufficient documentation

## 2016-01-30 DIAGNOSIS — R0682 Tachypnea, not elsewhere classified: Secondary | ICD-10-CM

## 2016-01-30 DIAGNOSIS — R159 Full incontinence of feces: Secondary | ICD-10-CM | POA: Diagnosis present

## 2016-01-30 DIAGNOSIS — N281 Cyst of kidney, acquired: Secondary | ICD-10-CM | POA: Diagnosis present

## 2016-01-30 DIAGNOSIS — I5032 Chronic diastolic (congestive) heart failure: Secondary | ICD-10-CM | POA: Diagnosis present

## 2016-01-30 DIAGNOSIS — I160 Hypertensive urgency: Secondary | ICD-10-CM | POA: Diagnosis not present

## 2016-01-30 DIAGNOSIS — I110 Hypertensive heart disease with heart failure: Secondary | ICD-10-CM

## 2016-01-30 DIAGNOSIS — Z7189 Other specified counseling: Secondary | ICD-10-CM | POA: Diagnosis not present

## 2016-01-30 DIAGNOSIS — Z801 Family history of malignant neoplasm of trachea, bronchus and lung: Secondary | ICD-10-CM | POA: Diagnosis not present

## 2016-01-30 DIAGNOSIS — Z8249 Family history of ischemic heart disease and other diseases of the circulatory system: Secondary | ICD-10-CM | POA: Diagnosis not present

## 2016-01-30 DIAGNOSIS — Z87891 Personal history of nicotine dependence: Secondary | ICD-10-CM | POA: Diagnosis not present

## 2016-01-30 DIAGNOSIS — Z923 Personal history of irradiation: Secondary | ICD-10-CM

## 2016-01-30 DIAGNOSIS — I420 Dilated cardiomyopathy: Secondary | ICD-10-CM | POA: Diagnosis present

## 2016-01-30 DIAGNOSIS — I429 Cardiomyopathy, unspecified: Secondary | ICD-10-CM | POA: Diagnosis not present

## 2016-01-30 DIAGNOSIS — Z66 Do not resuscitate: Secondary | ICD-10-CM | POA: Insufficient documentation

## 2016-01-30 DIAGNOSIS — I050 Rheumatic mitral stenosis: Secondary | ICD-10-CM | POA: Diagnosis present

## 2016-01-30 DIAGNOSIS — Z9221 Personal history of antineoplastic chemotherapy: Secondary | ICD-10-CM

## 2016-01-30 DIAGNOSIS — S31000A Unspecified open wound of lower back and pelvis without penetration into retroperitoneum, initial encounter: Secondary | ICD-10-CM | POA: Diagnosis not present

## 2016-01-30 DIAGNOSIS — Z681 Body mass index (BMI) 19 or less, adult: Secondary | ICD-10-CM | POA: Diagnosis not present

## 2016-01-30 DIAGNOSIS — H9190 Unspecified hearing loss, unspecified ear: Secondary | ICD-10-CM | POA: Diagnosis present

## 2016-01-30 DIAGNOSIS — R634 Abnormal weight loss: Secondary | ICD-10-CM | POA: Diagnosis present

## 2016-01-30 DIAGNOSIS — R1030 Lower abdominal pain, unspecified: Secondary | ICD-10-CM | POA: Diagnosis present

## 2016-01-30 DIAGNOSIS — N183 Chronic kidney disease, stage 3 (moderate): Secondary | ICD-10-CM | POA: Diagnosis present

## 2016-01-30 DIAGNOSIS — Z85048 Personal history of other malignant neoplasm of rectum, rectosigmoid junction, and anus: Secondary | ICD-10-CM | POA: Diagnosis not present

## 2016-01-30 DIAGNOSIS — R0600 Dyspnea, unspecified: Secondary | ICD-10-CM

## 2016-01-30 DIAGNOSIS — I161 Hypertensive emergency: Principal | ICD-10-CM

## 2016-01-30 DIAGNOSIS — I130 Hypertensive heart and chronic kidney disease with heart failure and stage 1 through stage 4 chronic kidney disease, or unspecified chronic kidney disease: Secondary | ICD-10-CM | POA: Diagnosis present

## 2016-01-30 DIAGNOSIS — J439 Emphysema, unspecified: Secondary | ICD-10-CM | POA: Diagnosis not present

## 2016-01-30 DIAGNOSIS — N179 Acute kidney failure, unspecified: Secondary | ICD-10-CM | POA: Diagnosis present

## 2016-01-30 DIAGNOSIS — Z7401 Bed confinement status: Secondary | ICD-10-CM | POA: Diagnosis not present

## 2016-01-30 DIAGNOSIS — Q6102 Congenital multiple renal cysts: Secondary | ICD-10-CM

## 2016-01-30 DIAGNOSIS — D696 Thrombocytopenia, unspecified: Secondary | ICD-10-CM | POA: Diagnosis present

## 2016-01-30 DIAGNOSIS — E43 Unspecified severe protein-calorie malnutrition: Secondary | ICD-10-CM | POA: Diagnosis present

## 2016-01-30 DIAGNOSIS — J449 Chronic obstructive pulmonary disease, unspecified: Secondary | ICD-10-CM | POA: Diagnosis present

## 2016-01-30 DIAGNOSIS — L89152 Pressure ulcer of sacral region, stage 2: Secondary | ICD-10-CM | POA: Diagnosis present

## 2016-01-30 DIAGNOSIS — R627 Adult failure to thrive: Secondary | ICD-10-CM | POA: Diagnosis present

## 2016-01-30 DIAGNOSIS — E876 Hypokalemia: Secondary | ICD-10-CM

## 2016-01-30 DIAGNOSIS — I5033 Acute on chronic diastolic (congestive) heart failure: Secondary | ICD-10-CM | POA: Diagnosis not present

## 2016-01-30 DIAGNOSIS — I119 Hypertensive heart disease without heart failure: Secondary | ICD-10-CM | POA: Diagnosis not present

## 2016-01-30 DIAGNOSIS — L8990 Pressure ulcer of unspecified site, unspecified stage: Secondary | ICD-10-CM | POA: Diagnosis present

## 2016-01-30 DIAGNOSIS — N2889 Other specified disorders of kidney and ureter: Secondary | ICD-10-CM

## 2016-01-30 DIAGNOSIS — R079 Chest pain, unspecified: Secondary | ICD-10-CM | POA: Diagnosis not present

## 2016-01-30 DIAGNOSIS — J438 Other emphysema: Secondary | ICD-10-CM | POA: Diagnosis not present

## 2016-01-30 DIAGNOSIS — R531 Weakness: Secondary | ICD-10-CM

## 2016-01-30 HISTORY — DX: Essential (primary) hypertension: I10

## 2016-01-30 LAB — URINE MICROSCOPIC-ADD ON

## 2016-01-30 LAB — I-STAT TROPONIN, ED
TROPONIN I, POC: 0.09 ng/mL — AB (ref 0.00–0.08)
Troponin i, poc: 0.09 ng/mL (ref 0.00–0.08)

## 2016-01-30 LAB — CBC
HCT: 36.8 % (ref 36.0–46.0)
Hemoglobin: 12.3 g/dL (ref 12.0–15.0)
MCH: 30.9 pg (ref 26.0–34.0)
MCHC: 33.4 g/dL (ref 30.0–36.0)
MCV: 92.5 fL (ref 78.0–100.0)
PLATELETS: 127 10*3/uL — AB (ref 150–400)
RBC: 3.98 MIL/uL (ref 3.87–5.11)
RDW: 14.2 % (ref 11.5–15.5)
WBC: 4.3 10*3/uL (ref 4.0–10.5)

## 2016-01-30 LAB — URINALYSIS, ROUTINE W REFLEX MICROSCOPIC
BILIRUBIN URINE: NEGATIVE
GLUCOSE, UA: NEGATIVE mg/dL
KETONES UR: NEGATIVE mg/dL
Nitrite: NEGATIVE
PROTEIN: NEGATIVE mg/dL
Specific Gravity, Urine: 1.009 (ref 1.005–1.030)
pH: 7.5 (ref 5.0–8.0)

## 2016-01-30 LAB — COMPREHENSIVE METABOLIC PANEL
ALBUMIN: 3.1 g/dL — AB (ref 3.5–5.0)
ALT: 14 U/L (ref 14–54)
ANION GAP: 8 (ref 5–15)
AST: 38 U/L (ref 15–41)
Alkaline Phosphatase: 66 U/L (ref 38–126)
BUN: 23 mg/dL — ABNORMAL HIGH (ref 6–20)
CHLORIDE: 99 mmol/L — AB (ref 101–111)
CO2: 32 mmol/L (ref 22–32)
Calcium: 13.7 mg/dL (ref 8.9–10.3)
Creatinine, Ser: 1.77 mg/dL — ABNORMAL HIGH (ref 0.44–1.00)
GFR calc non Af Amer: 27 mL/min — ABNORMAL LOW (ref >60)
GFR, EST AFRICAN AMERICAN: 31 mL/min — AB (ref >60)
Glucose, Bld: 96 mg/dL (ref 65–99)
Potassium: 3.2 mmol/L — ABNORMAL LOW (ref 3.5–5.1)
SODIUM: 139 mmol/L (ref 135–145)
Total Bilirubin: 0.6 mg/dL (ref 0.3–1.2)
Total Protein: 6.5 g/dL (ref 6.5–8.1)

## 2016-01-30 LAB — I-STAT CHEM 8, ED
BUN: 26 mg/dL — ABNORMAL HIGH (ref 6–20)
Calcium, Ion: 1.82 mmol/L (ref 1.13–1.30)
Chloride: 96 mmol/L — ABNORMAL LOW (ref 101–111)
Creatinine, Ser: 1.9 mg/dL — ABNORMAL HIGH (ref 0.44–1.00)
Glucose, Bld: 93 mg/dL (ref 65–99)
HCT: 44 % (ref 36.0–46.0)
HEMOGLOBIN: 15 g/dL (ref 12.0–15.0)
POTASSIUM: 3.2 mmol/L — AB (ref 3.5–5.1)
SODIUM: 139 mmol/L (ref 135–145)
TCO2: 33 mmol/L (ref 0–100)

## 2016-01-30 LAB — PROTIME-INR
INR: 1.12 (ref 0.00–1.49)
Prothrombin Time: 14.6 seconds (ref 11.6–15.2)

## 2016-01-30 LAB — TROPONIN I
Troponin I: 0.09 ng/mL — ABNORMAL HIGH (ref <0.031)
Troponin I: 0.1 ng/mL — ABNORMAL HIGH (ref <0.031)

## 2016-01-30 LAB — HEPARIN LEVEL (UNFRACTIONATED): HEPARIN UNFRACTIONATED: 0.37 [IU]/mL (ref 0.30–0.70)

## 2016-01-30 LAB — APTT: APTT: 31 s (ref 24–37)

## 2016-01-30 SURGERY — LEFT HEART CATH AND CORONARY ANGIOGRAPHY

## 2016-01-30 MED ORDER — ENSURE ENLIVE PO LIQD
237.0000 mL | Freq: Two times a day (BID) | ORAL | Status: DC
  Administered 2016-01-30 – 2016-02-05 (×7): 237 mL via ORAL
  Filled 2016-01-30: qty 237

## 2016-01-30 MED ORDER — SODIUM CHLORIDE 0.9 % IV SOLN
INTRAVENOUS | Status: DC
  Administered 2016-01-30: 14:00:00 via INTRAVENOUS

## 2016-01-30 MED ORDER — METOPROLOL TARTRATE 50 MG PO TABS
50.0000 mg | ORAL_TABLET | Freq: Two times a day (BID) | ORAL | Status: DC
  Administered 2016-01-30 – 2016-01-31 (×2): 50 mg via ORAL
  Filled 2016-01-30 (×2): qty 1

## 2016-01-30 MED ORDER — MOMETASONE FURO-FORMOTEROL FUM 200-5 MCG/ACT IN AERO
2.0000 | INHALATION_SPRAY | Freq: Two times a day (BID) | RESPIRATORY_TRACT | Status: DC
  Administered 2016-01-30 – 2016-02-04 (×10): 2 via RESPIRATORY_TRACT
  Filled 2016-01-30: qty 8.8

## 2016-01-30 MED ORDER — FUROSEMIDE 10 MG/ML IJ SOLN: 40.0000 mg | Freq: Two times a day (BID) | INTRAMUSCULAR | Status: DC

## 2016-01-30 MED ORDER — ALBUTEROL SULFATE (2.5 MG/3ML) 0.083% IN NEBU
2.5000 mg | INHALATION_SOLUTION | RESPIRATORY_TRACT | Status: DC | PRN
  Administered 2016-01-31 – 2016-02-03 (×6): 2.5 mg via RESPIRATORY_TRACT
  Filled 2016-01-30 (×7): qty 3

## 2016-01-30 MED ORDER — HEPARIN BOLUS VIA INFUSION
2000.0000 [IU] | Freq: Once | INTRAVENOUS | Status: AC
  Administered 2016-01-30: 2000 [IU] via INTRAVENOUS
  Filled 2016-01-30: qty 2000

## 2016-01-30 MED ORDER — NITROGLYCERIN IN D5W 200-5 MCG/ML-% IV SOLN
0.0000 ug/min | Freq: Once | INTRAVENOUS | Status: DC
  Filled 2016-01-30: qty 250

## 2016-01-30 MED ORDER — ALBUTEROL SULFATE HFA 108 (90 BASE) MCG/ACT IN AERS: 2.0000 | INHALATION_SPRAY | RESPIRATORY_TRACT | Status: DC | PRN

## 2016-01-30 MED ORDER — HYDRALAZINE HCL 10 MG PO TABS
10.0000 mg | ORAL_TABLET | Freq: Three times a day (TID) | ORAL | Status: DC
  Administered 2016-01-30 – 2016-01-31 (×3): 10 mg via ORAL
  Filled 2016-01-30 (×3): qty 1

## 2016-01-30 MED ORDER — SODIUM CHLORIDE 0.9 % IV BOLUS (SEPSIS)
500.0000 mL | Freq: Once | INTRAVENOUS | Status: AC
  Administered 2016-01-30: 500 mL via INTRAVENOUS

## 2016-01-30 MED ORDER — HEPARIN (PORCINE) IN NACL 100-0.45 UNIT/ML-% IJ SOLN
500.0000 [IU]/h | INTRAMUSCULAR | Status: DC
  Administered 2016-01-30: 450 [IU]/h via INTRAVENOUS
  Filled 2016-01-30: qty 250

## 2016-01-30 MED ORDER — AMLODIPINE BESYLATE 2.5 MG PO TABS
2.5000 mg | ORAL_TABLET | Freq: Every day | ORAL | Status: DC
  Administered 2016-01-30 – 2016-01-31 (×2): 2.5 mg via ORAL
  Filled 2016-01-30 (×2): qty 1

## 2016-01-30 MED ORDER — METOPROLOL TARTRATE 25 MG PO TABS: 25.0000 mg | ORAL_TABLET | Freq: Two times a day (BID) | ORAL | Status: DC

## 2016-01-30 MED ORDER — ASPIRIN 81 MG PO CHEW: 324.0000 mg | CHEWABLE_TABLET | Freq: Once | ORAL | Status: DC

## 2016-01-30 MED ORDER — NITROGLYCERIN IN D5W 200-5 MCG/ML-% IV SOLN
0.0000 ug/min | Freq: Once | INTRAVENOUS | Status: AC
  Administered 2016-01-30: 5 ug/min via INTRAVENOUS
  Filled 2016-01-30: qty 250

## 2016-01-30 MED ORDER — TIOTROPIUM BROMIDE MONOHYDRATE 18 MCG IN CAPS
18.0000 ug | ORAL_CAPSULE | Freq: Every day | RESPIRATORY_TRACT | Status: DC
  Administered 2016-01-31 – 2016-02-04 (×5): 18 ug via RESPIRATORY_TRACT
  Filled 2016-01-30: qty 5

## 2016-01-30 MED ORDER — POTASSIUM CHLORIDE CRYS ER 20 MEQ PO TBCR
40.0000 meq | EXTENDED_RELEASE_TABLET | ORAL | Status: AC
  Administered 2016-01-30 (×2): 40 meq via ORAL
  Filled 2016-01-30 (×2): qty 2

## 2016-01-30 MED ORDER — SODIUM CHLORIDE 0.9 % IV SOLN
INTRAVENOUS | Status: DC
  Administered 2016-01-30 – 2016-02-01 (×3): via INTRAVENOUS

## 2016-01-30 MED ORDER — ACETAMINOPHEN 325 MG PO TABS
650.0000 mg | ORAL_TABLET | Freq: Two times a day (BID) | ORAL | Status: DC | PRN
  Administered 2016-01-31: 650 mg via ORAL
  Filled 2016-01-30: qty 2

## 2016-01-30 NOTE — ED Notes (Signed)
Attempted report x1. 

## 2016-01-30 NOTE — ED Notes (Signed)
Heart Healthy tray ordered.  

## 2016-01-30 NOTE — Progress Notes (Signed)
ANTICOAGULATION CONSULT NOTE - Follow up [place] for heparin Indication: chest pain/ACS  Allergies  Allergen Reactions  . Morphine And Related Other (See Comments)    "makes me crazy"  . Ciprofloxacin Hives and Rash    Patient Measurements: Height: 5' (152.4 cm) Weight: 80 lb (36.288 kg) IBW/kg (Calculated) : 45.5 Heparin Dosing Weight: 36 kg  Vital Signs: Temp: 97.8 F (36.6 C) (04/03 1900) Temp Source: Oral (04/03 1900) BP: 142/61 mmHg (04/03 1900) Pulse Rate: 81 (04/03 1900)  Labs:  Recent Labs  01/30/16 0908 01/30/16 0927 01/30/16 1648 01/30/16 1823  HGB 12.3 15.0  --   --   HCT 36.8 44.0  --   --   PLT 127*  --   --   --   APTT 31  --   --   --   LABPROT 14.6  --   --   --   INR 1.12  --   --   --   HEPARINUNFRC  --   --   --  0.37  CREATININE 1.77* 1.90*  --   --   TROPONINI  --   --  0.09*  --     Estimated Creatinine Clearance: 14.7 mL/min (by C-G formula based on Cr of 1.9).   Medical History: Past Medical History  Diagnosis Date  . COPD (chronic obstructive pulmonary disease) ([place])   . Rectal adenocarcinoma ([place])   . Lower GI bleed   . Hypertension     Medications:  See EMR  Assessment: 75 yo female to start heparin gtt for ACS. Does not require emergent cath. Body weight verified with pt and family. AKI with Scr wnl > 1.9, eCrCl < 20 ml/min. HL 0.37  (goal 0.3-0.7) on 450 units/hr of heparin. No sxs of bleeding.   Goal of Therapy:  Heparin level 0.3-0.7 units/ml Monitor platelets by anticoagulation protocol: Yes    Plan:  -Continue heparin at 450 units/hr -Daily  HL, CBC -Monitor s/sx bleeding -Noted plans to continue heparin until troponin down trending   Vincenza Hews, PharmD, BCPS 01/30/2016, 7:10 PM Pager: [phone]

## 2016-01-30 NOTE — H&P (Signed)
[facility] History and Physical  [name] [id number] DOB: [birth year] DOA: 01/30/2016  Referring physician: Emergency Department PCP: Shirline Frees, MD   CHIEF COMPLAINT:   Weakness and confusion                HPI: [name] is a 75 y.o. female with multiple medical problems not limited to COPD, hypertension, hyperlipidemia, and colorectal cancer. Patient brought to the ED this morning for weakness and confusion.   Per family she's had excessive weight loss. EKG noted by EMS, code STEMI called. EDP spoke with cardiology who will see the patient. Patient with significantly elevated blood pressure on arrival. Initial troponin 0.09. Patient started on heparin drip and nitroglycerin drip, BP improved from initial reading of 215/ 99 to 125/70.  Patient denies chest pain. She mainly complains of excessive sleepiness. She uses inhalers at home, denies cough or shortness of breath. Her appetite is poor and patient reported excessive weight loss over last few months.   ED workup:   Potassium 3.2 BUN 26, creatinine 1.9  -baseline creatinine 1.07 Ca+ 13.7 - baseline 9.5 in February.   EKG:    Sinus rhythm Right atrial enlargement Borderline right axis deviation Probable LVH with secondary repol abnrm Repol abnrm, severe global ischemia (LM/MVD)                    Medications  nitroGLYCERIN 50 mg in dextrose 5 % 250 mL (0.2 mg/mL) infusion (0 mcg/min Intravenous Not Given 01/30/16 0938)  aspirin chewable tablet 324 mg (324 mg Oral Not Given 01/30/16 0939)  heparin ADULT infusion 100 units/mL (25000 units/250 mL) (450 Units/hr Intravenous New Bag/Given 01/30/16 0953)  sodium chloride 0.9 % bolus 500 mL (not administered)  nitroGLYCERIN 50 mg in dextrose 5 % 250 mL (0.2 mg/mL) infusion (15 mcg/min Intravenous Rate/Dose Change 01/30/16 1022)  heparin bolus via infusion 2,000 Units (2,000 Units Intravenous Given 01/30/16 0954)   Review of systems may not be reliable as it differs  significantly from EDP's and patient having confusion at home Review of Systems  Constitutional: Positive for malaise/fatigue.  HENT: Negative.   Eyes: Negative.   Respiratory: Negative.   Cardiovascular: Negative.   Gastrointestinal: Negative.   Genitourinary: Negative.   Skin: Negative.   Neurological: Negative.   Endo/Heme/Allergies: Negative.   Psychiatric/Behavioral: Negative.     Past Medical History  Diagnosis Date  . COPD (chronic obstructive pulmonary disease) ([place])   . Rectal adenocarcinoma ([place])   . Lower GI bleed   . Hypertension    Past Surgical History  Procedure Laterality Date  . Hip arthroplasty    . Colonoscopy    . Rectal surgery    . Orif periprosthetic fracture  05/28/2012    Procedure: OPEN REDUCTION INTERNAL FIXATION (ORIF) PERIPROSTHETIC FRACTURE;  Surgeon: Gearlean Alf, MD;  Location: [facility];  Service: Orthopedics;  Laterality: Left;  . Total hip revision  05/28/2012    Procedure: TOTAL HIP REVISION;  Surgeon: Gearlean Alf, MD;  Location: [facility];  Service: Orthopedics;  Laterality: Left;  TOTAL HIP ARTHROPLASTY/FEMORAL FRACTURE    SOCIAL HISTORY:  reports that she quit smoking about 5 years ago. Her smoking use included Cigarettes. She does not have any smokeless tobacco history on file. She reports that she does not drink alcohol or use illicit drugs. Lives: at home alone but children live in close proximity.   Assistive devices:   Limited ability to walk, uses wheelchair at home.   Allergies  Allergen Reactions  . Morphine And Related Other (See Comments)    "makes me crazy"  . Ciprofloxacin Hives and Rash    FMH: Lung cancer, hypertension, myocardial infarction  Prior to Admission medications   Medication Sig Start Date End Date Taking? Authorizing Provider  acetaminophen (TYLENOL) 650 MG CR tablet Take 650 mg by mouth 2 (two) times daily as needed. For pain   Yes Historical Provider, MD  budesonide-formoterol (SYMBICORT) 160-4.5 MCG/ACT  inhaler Inhale 2 puffs into the lungs 2 (two) times daily.   Yes Historical Provider, MD  Calcium Carb-Cholecalciferol 600-800 MG-UNIT TABS Take 1 tablet by mouth daily.   Yes Historical Provider, MD  feeding supplement (ENSURE COMPLETE) LIQD Take 237 mLs by mouth 2 (two) times daily between meals. 06/02/12  Yes Barton Dubois, MD  hydrALAZINE (APRESOLINE) 10 MG tablet Take 10 mg by mouth 3 (three) times daily.   Yes Historical Provider, MD  lisinopril (PRINIVIL,ZESTRIL) 10 MG tablet Take 10 mg by mouth daily. 12/09/13  Yes Historical Provider, MD  metoprolol tartrate (LOPRESSOR) 25 MG tablet Take 25 mg by mouth 2 (two) times daily.   Yes Historical Provider, MD  Multiple Vitamin (MULTIVITAMIN WITH MINERALS) TABS Take 1 tablet by mouth daily.   Yes Historical Provider, MD  PROAIR HFA 108 (90 BASE) MCG/ACT inhaler Inhale 2 puffs into the lungs every 4 (four) hours as needed for wheezing or shortness of breath.  08/09/14  Yes Historical Provider, MD  tiotropium (SPIRIVA) 18 MCG inhalation capsule Place 18 mcg into inhaler and inhale daily.   Yes Historical Provider, MD  calcium-vitamin D (OSCAL WITH D) 500-200 MG-UNIT per tablet Take 1 tablet by mouth 2 (two) times daily. 06/02/12 10/03/14  Barton Dubois, MD  ferrous sulfate 325 (65 FE) MG tablet Take 1 tablet (325 mg total) by mouth 3 (three) times daily after meals. 06/02/12 10/03/14  Barton Dubois, MD   PHYSICAL EXAM: Filed Vitals:   01/30/16 1145 01/30/16 1200 01/30/16 1230 01/30/16 1245  BP: 144/80 147/80 127/75 125/70  Pulse: 64 63 65 65  Temp:      TempSrc:      Resp: 21 21 22 23   Height:      Weight:      SpO2: 100% 100% 100% 100%    Wt Readings from Last 3 Encounters:  01/30/16 36.288 kg (80 lb)  05/29/12 39.4 kg (86 lb 13.8 oz)    General:  Pleasant, emaciated white female. Appears calm and comfortable Eyes: PER, normal lids, irises & conjunctiva ENT: grossly normal hearing, lips & tongue Neck: no LAD, no masses Cardiovascular: RRR,  no murmurs. No LE edema.  Respiratory: Respirations even and unlabored. Decreased breath sounds bilaterally. No wheezes / rales .   Abdomen: soft, non-distended, non-tender, active bowel sounds. RLQ mass (size of half dollar) seems superficial and possibly stool. Skin: no rash seen on limited exam Musculoskeletal: grossly normal tone BUE/BLE Psychiatric: grossly normal mood and affect, speech fluent and appropriate Neurologic: grossly non-focal.         LABS ON ADMISSION:    Basic Metabolic Panel:  Recent Labs Lab 01/30/16 0908 01/30/16 0927  NA 139 139  K 3.2* 3.2*  CL 99* 96*  CO2 32  --   GLUCOSE 96 93  BUN 23* 26*  CREATININE 1.77* 1.90*  CALCIUM 13.7*  --    Liver Function Tests:  Recent Labs Lab 01/30/16 0908  AST 38  ALT 14  ALKPHOS 66  BILITOT 0.6  PROT 6.5  ALBUMIN  3.1*    CBC:  Recent Labs Lab 01/30/16 0908 01/30/16 0927  WBC 4.3  --   HGB 12.3 15.0  HCT 36.8 44.0  MCV 92.5  --   PLT 127*  --     CREATININE: 1.9 mg/dL ABNORMAL (01/30/16 0927) Estimated creatinine clearance - 14.7 mL/min  Radiological Exams on Admission: Ct Abdomen Pelvis Wo Contrast  01/30/2016  CLINICAL DATA:  Lower abdominal pain for 1 week. EXAM: CT ABDOMEN AND PELVIS WITHOUT CONTRAST TECHNIQUE: Multidetector CT imaging of the abdomen and pelvis was performed following the standard protocol without IV contrast. COMPARISON:  CT of the abdomen and pelvis with contrast 06/05/2011 FINDINGS: Lower chest: The lung bases are clear without focal nodule, mass, or airspace disease. The heart size is normal. A pericardial effusion is present. No significant pleural effusions are present. Hepatobiliary: No focal hepatic lesions are present. The common bile duct and gallbladder are normal. Pancreas: The pancreas is within normal limits. Spleen: The spleen is unremarkable. Adrenals/Urinary Tract: The adrenal glands are normal bilaterally. Multiple exophytic lesions of the left kidney have  increased in size the largest low-density lesion is a lower pole, measuring 2.3 cm. There are several hyperdense lesions now present. The largest is at the left upper pole measured 1.7 x 2.0 cm. This lesion was more hypodense in 2012. Ureters are within normal limits. The urinary bladder is unremarkable. Stomach/Bowel: The stomach and duodenum are within normal limits. The small bowel is unremarkable. The appendix is not discretely visualized and may be surgically absent. The ascending and transverse colon are within normal limits. The descending and rectosigmoid colon are unremarkable. A moderate amount stool is present throughout the colon. Vascular/Lymphatic: Dense atherosclerotic calcifications are present within the aorta and branch vessels. There is no significant adenopathy. Reproductive: Within normal limits for age. Other: No significant free fluid is present. Musculoskeletal: A vacuum disc at L5-S1 is stable. And calcification of the L3-4 disc is again noted. Vertebral body heights and alignment are maintained. Moderate central canal and bilateral foraminal stenosis is evident at L4-5. IMPRESSION: 1. Increasing size and number of multiple exophytic lesions involving the left kidney. Recommend MRI of the kidney without with contrast for further evaluation. 2. Hyperdense lesion at the upper pole of the left kidney has increased in size, now measuring 1.7 x 2.0 cm. 3. Extensive atherosclerotic change without definite stenosis or aneurysm. 4. Mild degenerative changes in the lower lumbar spine are similar to the prior study. Electronically Signed   By: San Morelle M.D.   On: 01/30/2016 11:33   Dg Chest Port 1 View  01/30/2016  CLINICAL DATA:  Shortness of Breath. EXAM: PORTABLE CHEST 1 VIEW COMPARISON:  05/27/2012 FINDINGS: There is hyperinflation of the lungs compatible with COPD. Heart and mediastinal contours are within normal limits. No focal opacities or effusions. No acute bony abnormality.  IMPRESSION: COPD.  No active disease. Electronically Signed   By: Rolm Baptise M.D.   On: 01/30/2016 10:26    ASSESSMENT / PLAN    Elevated troponin - 0.09 x2 with ST abnormalities on EKG. Code STEMI called in field but didn't meet STEMI criteria. Given ASA and started on NTG / heparin gtt. Trop elevation and EKG changes possibly secondary to hypertensive emergency. Bedside echo in ED revealed grossly normal EF. -admit to stepdown -Cardiology has evaluated and declared no STEMI.   -cycle troponins -echocardiogram  Hypertensive emergency with initial BP of 220/96, AKI. BP down to 120s/70s with NTG drip.  -admitting to Stepdown.  -  continue NTG drip  -continue home Lopressor and Apresoline  AKI. Baseline Cr 1.07, up to 1.9 today. AKI in setting of hypertensive emergency. Should improve with treatment of HTN -hold ACEI -gentile IV hydration -am bmet  Hypercalcemia, 13.7. Ionized Ca+ 1.82. Albumin 3.1. Can't exclude underlying malignancy.  -obtain PTH, Vit D level.  -She is taking Calcium at home - will hold -IV hydration -repeat calcium in am  Weight loss. Etiology not yet clear. No malignancy seen on abd/pelvis CTscan though was without contrast.  -continue home Ensure BID  Hypercalemia, calcium 13.7 ( baseline around 9.5). Ionized Ca 1.82. Elevated calcium in setting of weight loss >>> rule out underlying malignancy. Non contrast CTscan of abd/pelvis unrevealing.  -obtain PTH   Lower abdominal pain. Noncontrast CT scan unrevealing. She does have moderate stool in colon on imaging so pain could be from constipation. There is a small mass in RLQ but seems superficial, could be stool. Nothing described on CTscan.  -Miralax BID if can tolerate.   Thrombocytopenia., chronic. Stable count at 127.   Enlarging renal cysts. Increase in size and number of left renal cysts on CTscan. Probably needs MRI for further evaluation (when acute issues resolve)  COPD. CXR negative. Normal  sats. -continue home inhalers  Chronic diastolic heart failure. No evidence for heart failure on exam or CXR.  -daily wts  -I&0  Hx of coloectal cancer, s/p LAR / chemoradiation in 2008.  CONSULTANTS:    Cardiology - Called by EDP  Code Status: full code DVT Prophylaxis: Lovenox  Family Communication:  Patient alert, oriented and seems to understands plan of care.  Disposition Plan: Discharge to home in 24-48 hours   Time spent: 60 minutes Tye Savoy  NP [facility] Pager [phone]

## 2016-01-30 NOTE — ED Notes (Signed)
Pt presents via [facility] as a Code STEMI, family called for increased weakness and intermittent confusion x 2-3 days.  - stroke screen, Hx: HTN, COPD.  BP-205/120 P-70s, CBG-117.  324 ASA given by EMS.  Pt denies pain.

## 2016-01-30 NOTE — ED Provider Notes (Signed)
CSN: [id number]     Arrival date & time 01/30/16  [id number] History   First MD Initiated Contact with Patient 01/30/16 [phone]     Chief Complaint  Patient presents with  . Code STEMI      HPI  75 year old female with history of COPD, not on home oxygen, history of colorectal surgery approximately 10 years ago for cancer, who presents after EMS was called by her family for increased weakness and intermittent confusion for 2-3 days. Weakness has been generalized. No focal extremity weakness. No sensory deficits. No aphasia or vision changes. Patient denies cardiac history including coronary artery disease or heart failure. Currently denies chest pain or shortness of breath that is worse than her baseline. Denies fevers, nausea, vomiting, dysuria. Patient does endorse lack of appetite, lower abdominal pain, and constipation. Last bowel movement was yesterday and she reports it is normal. Denies blood in her stool. Family endorses 15 lb weight loss over the last couple of months.  Past Medical History  Diagnosis Date  . COPD (chronic obstructive pulmonary disease) ([place])   . Rectal adenocarcinoma ([place])   . Lower GI bleed   . Hypertension    Past Surgical History  Procedure Laterality Date  . Hip arthroplasty    . Colonoscopy    . Rectal surgery    . Orif periprosthetic fracture  05/28/2012    Procedure: OPEN REDUCTION INTERNAL FIXATION (ORIF) PERIPROSTHETIC FRACTURE;  Surgeon: Gearlean Alf, MD;  Location: [facility];  Service: Orthopedics;  Laterality: Left;  . Total hip revision  05/28/2012    Procedure: TOTAL HIP REVISION;  Surgeon: Gearlean Alf, MD;  Location: [facility];  Service: Orthopedics;  Laterality: Left;  TOTAL HIP ARTHROPLASTY/FEMORAL FRACTURE   No family history on file. Social History  Substance Use Topics  . Smoking status: Former Smoker    Types: Cigarettes    Quit date: 03/27/2010  . Smokeless tobacco: None  . Alcohol Use: No   OB History    No data available     Review of  Systems  Constitutional: Positive for appetite change and fatigue. Negative for fever, chills and activity change.  HENT: Negative for congestion, rhinorrhea and sore throat.   Eyes: Negative for visual disturbance.  Respiratory: Positive for shortness of breath (at baseline). Negative for cough and wheezing.   Cardiovascular: Negative for chest pain, palpitations and leg swelling.  Gastrointestinal: Positive for abdominal pain (lower) and constipation. Negative for nausea, vomiting, diarrhea, blood in stool and abdominal distention.  Genitourinary: Negative for dysuria, frequency and flank pain.  Musculoskeletal: Negative for myalgias, back pain, joint swelling, arthralgias, gait problem, neck pain and neck stiffness.  Skin: Negative for rash.  Neurological: Positive for weakness (generalized). Negative for dizziness, tremors, syncope, facial asymmetry, speech difficulty, numbness and headaches.  Psychiatric/Behavioral: Positive for confusion (mild, worse than baseline). Negative for behavioral problems and agitation.      Allergies  Morphine and related and Ciprofloxacin  Home Medications   Prior to Admission medications   Medication Sig Start Date End Date Taking? Authorizing Provider  acetaminophen (TYLENOL) 650 MG CR tablet Take 650 mg by mouth 2 (two) times daily as needed. For pain   Yes Historical Provider, MD  budesonide-formoterol (SYMBICORT) 160-4.5 MCG/ACT inhaler Inhale 2 puffs into the lungs 2 (two) times daily.   Yes Historical Provider, MD  Calcium Carb-Cholecalciferol 600-800 MG-UNIT TABS Take 1 tablet by mouth daily.   Yes Historical Provider, MD  feeding supplement (ENSURE COMPLETE) LIQD Take 237 mLs  by mouth 2 (two) times daily between meals. 06/02/12  Yes Barton Dubois, MD  hydrALAZINE (APRESOLINE) 10 MG tablet Take 10 mg by mouth 3 (three) times daily.   Yes Historical Provider, MD  lisinopril (PRINIVIL,ZESTRIL) 10 MG tablet Take 10 mg by mouth daily. 12/09/13  Yes  Historical Provider, MD  metoprolol tartrate (LOPRESSOR) 25 MG tablet Take 25 mg by mouth 2 (two) times daily.   Yes Historical Provider, MD  Multiple Vitamin (MULTIVITAMIN WITH MINERALS) TABS Take 1 tablet by mouth daily.   Yes Historical Provider, MD  PROAIR HFA 108 (90 BASE) MCG/ACT inhaler Inhale 2 puffs into the lungs every 4 (four) hours as needed for wheezing or shortness of breath.  08/09/14  Yes Historical Provider, MD  tiotropium (SPIRIVA) 18 MCG inhalation capsule Place 18 mcg into inhaler and inhale daily.   Yes Historical Provider, MD  calcium-vitamin D (OSCAL WITH D) 500-200 MG-UNIT per tablet Take 1 tablet by mouth 2 (two) times daily. 06/02/12 10/03/14  Barton Dubois, MD  ferrous sulfate 325 (65 FE) MG tablet Take 1 tablet (325 mg total) by mouth 3 (three) times daily after meals. 06/02/12 10/03/14  Barton Dubois, MD   BP 137/88 mmHg  Pulse 73  Temp(Src) 97.4 F (36.3 C) (Axillary)  Resp 21  Ht 5' (1.524 m)  Wt 36.288 kg  BMI 15.62 kg/m2  SpO2 100% Physical Exam  Constitutional: She is oriented to person, place, and time. No distress.  thin  HENT:  Head: Normocephalic and atraumatic.  Right Ear: External ear normal.  Left Ear: External ear normal.  Nose: Nose normal.  Mouth/Throat: Oropharynx is clear and moist. No oropharyngeal exudate.  Eyes: Conjunctivae and EOM are normal. Pupils are equal, round, and reactive to light. Right eye exhibits no discharge. Left eye exhibits no discharge.  Neck: Normal range of motion. Neck supple.  Cardiovascular: Normal rate, regular rhythm and normal heart sounds.  Exam reveals no gallop and no friction rub.   No murmur heard. Pulmonary/Chest: Effort normal and breath sounds normal. No respiratory distress. She has no wheezes. She has no rales.  Abdominal: Soft. Bowel sounds are normal. She exhibits no distension and no mass. There is tenderness (TTP over the lower abdomen/suprapubic region). There is no rebound and no guarding.   Musculoskeletal: Normal range of motion. She exhibits no edema or tenderness.  Neurological: She is alert and oriented to person, place, and time. She exhibits normal muscle tone.  Skin: Skin is warm and dry. No rash noted. She is not diaphoretic.  Psychiatric: She has a normal mood and affect. Her behavior is normal. Judgment and thought content normal.    ED Course  Procedures (including critical care time) Labs Review Labs Reviewed  CBC - Abnormal; Notable for the following:    Platelets 127 (*)    All other components within normal limits  COMPREHENSIVE METABOLIC PANEL - Abnormal; Notable for the following:    Potassium 3.2 (*)    Chloride 99 (*)    BUN 23 (*)    Creatinine, Ser 1.77 (*)    Calcium 13.7 (*)    Albumin 3.1 (*)    GFR calc non Af Amer 27 (*)    GFR calc Af Amer 31 (*)    All other components within normal limits  URINALYSIS, ROUTINE W REFLEX MICROSCOPIC (NOT AT [facility]) - Abnormal; Notable for the following:    APPearance CLOUDY (*)    Hgb urine dipstick MODERATE (*)    Leukocytes, UA TRACE (*)  All other components within normal limits  URINE MICROSCOPIC-ADD ON - Abnormal; Notable for the following:    Squamous Epithelial / LPF 0-5 (*)    Bacteria, UA FEW (*)    All other components within normal limits  I-STAT CHEM 8, ED - Abnormal; Notable for the following:    Potassium 3.2 (*)    Chloride 96 (*)    BUN 26 (*)    Creatinine, Ser 1.90 (*)    Calcium, Ion 1.82 (*)    All other components within normal limits  I-STAT TROPOININ, ED - Abnormal; Notable for the following:    Troponin i, poc 0.09 (*)    All other components within normal limits  I-STAT TROPOININ, ED - Abnormal; Notable for the following:    Troponin i, poc 0.09 (*)    All other components within normal limits  APTT  PROTIME-INR  HEPARIN LEVEL (UNFRACTIONATED)  I-STAT TROPOININ, ED    Imaging Review Ct Abdomen Pelvis Wo Contrast  01/30/2016  CLINICAL DATA:  Lower abdominal pain for  1 week. EXAM: CT ABDOMEN AND PELVIS WITHOUT CONTRAST TECHNIQUE: Multidetector CT imaging of the abdomen and pelvis was performed following the standard protocol without IV contrast. COMPARISON:  CT of the abdomen and pelvis with contrast 06/05/2011 FINDINGS: Lower chest: The lung bases are clear without focal nodule, mass, or airspace disease. The heart size is normal. A pericardial effusion is present. No significant pleural effusions are present. Hepatobiliary: No focal hepatic lesions are present. The common bile duct and gallbladder are normal. Pancreas: The pancreas is within normal limits. Spleen: The spleen is unremarkable. Adrenals/Urinary Tract: The adrenal glands are normal bilaterally. Multiple exophytic lesions of the left kidney have increased in size the largest low-density lesion is a lower pole, measuring 2.3 cm. There are several hyperdense lesions now present. The largest is at the left upper pole measured 1.7 x 2.0 cm. This lesion was more hypodense in 2012. Ureters are within normal limits. The urinary bladder is unremarkable. Stomach/Bowel: The stomach and duodenum are within normal limits. The small bowel is unremarkable. The appendix is not discretely visualized and may be surgically absent. The ascending and transverse colon are within normal limits. The descending and rectosigmoid colon are unremarkable. A moderate amount stool is present throughout the colon. Vascular/Lymphatic: Dense atherosclerotic calcifications are present within the aorta and branch vessels. There is no significant adenopathy. Reproductive: Within normal limits for age. Other: No significant free fluid is present. Musculoskeletal: A vacuum disc at L5-S1 is stable. And calcification of the L3-4 disc is again noted. Vertebral body heights and alignment are maintained. Moderate central canal and bilateral foraminal stenosis is evident at L4-5. IMPRESSION: 1. Increasing size and number of multiple exophytic lesions  involving the left kidney. Recommend MRI of the kidney without with contrast for further evaluation. 2. Hyperdense lesion at the upper pole of the left kidney has increased in size, now measuring 1.7 x 2.0 cm. 3. Extensive atherosclerotic change without definite stenosis or aneurysm. 4. Mild degenerative changes in the lower lumbar spine are similar to the prior study. Electronically Signed   By: San Morelle M.D.   On: 01/30/2016 11:33   Dg Chest Port 1 View  01/30/2016  CLINICAL DATA:  Shortness of Breath. EXAM: PORTABLE CHEST 1 VIEW COMPARISON:  05/27/2012 FINDINGS: There is hyperinflation of the lungs compatible with COPD. Heart and mediastinal contours are within normal limits. No focal opacities or effusions. No acute bony abnormality. IMPRESSION: COPD.  No active disease.  Electronically Signed   By: Rolm Baptise M.D.   On: 01/30/2016 10:26   I have personally reviewed and evaluated these images and lab results as part of my medical decision-making.   EKG Interpretation None      MDM   Final diagnoses:  Weakness  Hypertensive emergency   Code STEMI called in the field by EMS for concerning EKG findings. On arrival EKG reveals worsening ST depressions in all leads, and mild ST elevation in aVR that does not meet criteria, so code STEMI was canceled. Patient noted to be hypertensive to 215/99. Troponin elevated to 0.09. Patient was given aspirin and started on heparin drip for possible NSTEMI, though it is possible that this troponin elevation and EKG changes are present in the setting of hypertensive emergency. Patient started on nitroglycerin drip with improvement in blood pressure. Bedside echocardiogram obtained according to documentation above, that showed grossly normal ejection fraction. Patient does not have history of heart failure but will require formal echo as an inpatient.  As the patient reports recent weight loss, lower abdominal pain, and history of colorectal cancer,  will obtain CT of the abdomen without contrast for further evaluation. Contrast will not be used as patient has an AKI with creatinine elevated 1.90. Ionized calcium level elevated to 1.82, which could be contributing to the pt's abdominal pain and confusion. Urine sent to rule out UTI as the cause of her symptoms.  CT abdomen and pelvis with no findings to explain the patient's acute lower abdominal pain. Patient is hypercalcemic, so will initiate gentle IV fluid rehydration. UA without evidence of urinary tract infection patient denies dysuria or frequency. Plan to admit to internal medicine for hypercalcemia, hypertensive emergency vs. NSTEMI. Patient stable for transfer.  Zipporah Plants, MD 01/30/16 1503  Elnora Morrison, MD 02/05/16 [phone]

## 2016-01-30 NOTE — Consult Note (Addendum)
Reason for Consult: abnormal  troponin   Referring Physician: Dr. Reather Converse, ER MD   PCP:  Shirline Frees, MD  Primary Cardiologist:New   [name] is an 75 y.o. female.    Chief Complaint:  Pt presented with increased weakness on intermittent confusion.   HPI: 75 year old female presented to ER by EMS as STEMI but with review of EKG changes have been present for a few years.  Cardiology declared no STEMI.   She had confusion and weakness this AM.  She has hx of COPD, hypertension, hyperlipidemia, and colorectal cancer.  Troponin is 0.09 and pt HTN with BP 215/99 on admit.  Now on IV heparin and NTG.  No hx of CAD in record and none to her recollection.  No chest pain.  EKG SR with ST depression II,III,AVF, v 1-4 but no acute changes.  Troponin 0.0  2, K+ 3.2 Cr 1.90   Currently pt somewhat confused but also hard of hearing.  No chest pain.  No SOB.  Started feeling bad 2-3 days ago. One son lives with her and daughter beside her and another son across the street.    Past Medical History  Diagnosis Date  . COPD (chronic obstructive pulmonary disease) ([place])   . Rectal adenocarcinoma ([place])   . Lower GI bleed   . Hypertension    Past Surgical History  Procedure Laterality Date  . Hip arthroplasty    . Colonoscopy    . Rectal surgery    . Orif periprosthetic fracture  05/28/2012    Procedure: OPEN REDUCTION INTERNAL FIXATION (ORIF) PERIPROSTHETIC FRACTURE;  Surgeon: Gearlean Alf, MD;  Location: [facility];  Service: Orthopedics;  Laterality: Left;  . Total hip revision  05/28/2012    Procedure: TOTAL HIP REVISION;  Surgeon: Gearlean Alf, MD;  Location: [facility];  Service: Orthopedics;  Laterality: Left;  TOTAL HIP ARTHROPLASTY/FEMORAL FRACTURE   No family history on file. Social History:  reports that she quit smoking about 5 years ago. Her smoking use included Cigarettes. She does not have any smokeless tobacco history on file. She reports that she does not drink alcohol or  use illicit drugs.  Allergies:  Allergies  Allergen Reactions  . Morphine And Related Other (See Comments)    "makes me crazy"  . Ciprofloxacin Hives and Rash   OUTPATIENT MEDICATIONS: No current facility-administered medications on file prior to encounter.   Current Outpatient Prescriptions on File Prior to Encounter  Medication Sig Dispense Refill  . acetaminophen (TYLENOL) 650 MG CR tablet Take 650 mg by mouth 2 (two) times daily as needed. For pain    . budesonide-formoterol (SYMBICORT) 160-4.5 MCG/ACT inhaler Inhale 2 puffs into the lungs 2 (two) times daily.    . feeding supplement (ENSURE COMPLETE) LIQD Take 237 mLs by mouth 2 (two) times daily between meals.    . hydrALAZINE (APRESOLINE) 10 MG tablet Take 10 mg by mouth 3 (three) times daily.    Marland Kitchen lisinopril (PRINIVIL,ZESTRIL) 10 MG tablet Take 10 mg by mouth daily.    . metoprolol tartrate (LOPRESSOR) 25 MG tablet Take 25 mg by mouth 2 (two) times daily.    . Multiple Vitamin (MULTIVITAMIN WITH MINERALS) TABS Take 1 tablet by mouth daily.    Marland Kitchen PROAIR HFA 108 (90 BASE) MCG/ACT inhaler Inhale 2 puffs into the lungs every 4 (four) hours as needed for wheezing or shortness of breath.   0  . tiotropium (SPIRIVA) 18 MCG inhalation capsule Place 18  mcg into inhaler and inhale daily.    . calcium-vitamin D (OSCAL WITH D) 500-200 MG-UNIT per tablet Take 1 tablet by mouth 2 (two) times daily.    . ferrous sulfate 325 (65 FE) MG tablet Take 1 tablet (325 mg total) by mouth 3 (three) times daily after meals.     . sodium chloride 75 mL/hr at 01/30/16 1350  . heparin 450 Units/hr (01/30/16 0953)   Results for orders placed or performed during the hospital encounter of 01/30/16 (from the past 48 hour(s))  APTT     Status: None   Collection Time: 01/30/16  9:08 AM  Result Value Ref Range   aPTT 31 24 - 37 seconds  CBC     Status: Abnormal   Collection Time: 01/30/16  9:08 AM  Result Value Ref Range   WBC 4.3 4.0 - 10.5 K/uL   RBC 3.98  3.87 - 5.11 MIL/uL   Hemoglobin 12.3 12.0 - 15.0 g/dL   HCT 36.8 36.0 - 46.0 %   MCV 92.5 78.0 - 100.0 fL   MCH 30.9 26.0 - 34.0 pg   MCHC 33.4 30.0 - 36.0 g/dL   RDW 14.2 11.5 - 15.5 %   Platelets 127 (L) 150 - 400 K/uL  Comprehensive metabolic panel     Status: Abnormal   Collection Time: 01/30/16  9:08 AM  Result Value Ref Range   Sodium 139 135 - 145 mmol/L   Potassium 3.2 (L) 3.5 - 5.1 mmol/L   Chloride 99 (L) 101 - 111 mmol/L   CO2 32 22 - 32 mmol/L   Glucose, Bld 96 65 - 99 mg/dL   BUN 23 (H) 6 - 20 mg/dL   Creatinine, Ser 1.77 (H) 0.44 - 1.00 mg/dL   Calcium 13.7 (HH) 8.9 - 10.3 mg/dL    Comment: REPEATED TO VERIFY CRITICAL RESULT CALLED TO, READ BACK BY AND VERIFIED WITH: H.MORRISON,RN 01/30/16 1018 BY BSLADE    Total Protein 6.5 6.5 - 8.1 g/dL   Albumin 3.1 (L) 3.5 - 5.0 g/dL   AST 38 15 - 41 U/L   ALT 14 14 - 54 U/L   Alkaline Phosphatase 66 38 - 126 U/L   Total Bilirubin 0.6 0.3 - 1.2 mg/dL   GFR calc non Af Amer 27 (L) >60 mL/min   GFR calc Af Amer 31 (L) >60 mL/min    Comment: (NOTE) The eGFR has been calculated using the CKD EPI equation. This calculation has not been validated in all clinical situations. eGFR's persistently <60 mL/min signify possible Chronic Kidney Disease.    Anion gap 8 5 - 15  Protime-INR     Status: None   Collection Time: 01/30/16  9:08 AM  Result Value Ref Range   Prothrombin Time 14.6 11.6 - 15.2 seconds   INR 1.12 0.00 - 1.49  I-stat troponin, ED     Status: Abnormal   Collection Time: 01/30/16  9:12 AM  Result Value Ref Range   Troponin i, poc 0.09 (HH) 0.00 - 0.08 ng/mL   Comment NOTIFIED PHYSICIAN    Comment 3            Comment: Due to the release kinetics of cTnI, a negative result within the first hours of the onset of symptoms does not rule out myocardial infarction with certainty. If myocardial infarction is still suspected, repeat the test at appropriate intervals.   I-stat chem 8, ed     Status: Abnormal    Collection Time: 01/30/16  9:27  AM  Result Value Ref Range   Sodium 139 135 - 145 mmol/L   Potassium 3.2 (L) 3.5 - 5.1 mmol/L   Chloride 96 (L) 101 - 111 mmol/L   BUN 26 (H) 6 - 20 mg/dL   Creatinine, Ser 3.72 (H) 0.44 - 1.00 mg/dL   Glucose, Bld 93 65 - 99 mg/dL   Calcium, Ion 9.02 (HH) 1.13 - 1.30 mmol/L   TCO2 33 0 - 100 mmol/L   Hemoglobin 15.0 12.0 - 15.0 g/dL   HCT 11.1 55.2 - 08.0 %   Comment NOTIFIED PHYSICIAN   Urinalysis, Routine w reflex microscopic (not at [facility])     Status: Abnormal   Collection Time: 01/30/16 10:25 AM  Result Value Ref Range   Color, Urine YELLOW YELLOW   APPearance CLOUDY (A) CLEAR   Specific Gravity, Urine 1.009 1.005 - 1.030   pH 7.5 5.0 - 8.0   Glucose, UA NEGATIVE NEGATIVE mg/dL   Hgb urine dipstick MODERATE (A) NEGATIVE   Bilirubin Urine NEGATIVE NEGATIVE   Ketones, ur NEGATIVE NEGATIVE mg/dL   Protein, ur NEGATIVE NEGATIVE mg/dL   Nitrite NEGATIVE NEGATIVE   Leukocytes, UA TRACE (A) NEGATIVE  Urine microscopic-add on     Status: Abnormal   Collection Time: 01/30/16 10:25 AM  Result Value Ref Range   Squamous Epithelial / LPF 0-5 (A) NONE SEEN   WBC, UA 6-30 0 - 5 WBC/hpf   RBC / HPF 6-30 0 - 5 RBC/hpf   Bacteria, UA FEW (A) NONE SEEN   Urine-Other AMORPHOUS URATES/PHOSPHATES   I-Stat Troponin, ED (not at [facility])     Status: Abnormal   Collection Time: 01/30/16  1:29 PM  Result Value Ref Range   Troponin i, poc 0.09 (HH) 0.00 - 0.08 ng/mL   Comment NOTIFIED PHYSICIAN    Comment 3            Comment: Due to the release kinetics of cTnI, a negative result within the first hours of the onset of symptoms does not rule out myocardial infarction with certainty. If myocardial infarction is still suspected, repeat the test at appropriate intervals.    Ct Abdomen Pelvis Wo Contrast  01/30/2016  CLINICAL DATA:  Lower abdominal pain for 1 week. EXAM: CT ABDOMEN AND PELVIS WITHOUT CONTRAST TECHNIQUE: Multidetector CT imaging of the abdomen  and pelvis was performed following the standard protocol without IV contrast. COMPARISON:  CT of the abdomen and pelvis with contrast 06/05/2011 FINDINGS: Lower chest: The lung bases are clear without focal nodule, mass, or airspace disease. The heart size is normal. A pericardial effusion is present. No significant pleural effusions are present. Hepatobiliary: No focal hepatic lesions are present. The common bile duct and gallbladder are normal. Pancreas: The pancreas is within normal limits. Spleen: The spleen is unremarkable. Adrenals/Urinary Tract: The adrenal glands are normal bilaterally. Multiple exophytic lesions of the left kidney have increased in size the largest low-density lesion is a lower pole, measuring 2.3 cm. There are several hyperdense lesions now present. The largest is at the left upper pole measured 1.7 x 2.0 cm. This lesion was more hypodense in 2012. Ureters are within normal limits. The urinary bladder is unremarkable. Stomach/Bowel: The stomach and duodenum are within normal limits. The small bowel is unremarkable. The appendix is not discretely visualized and may be surgically absent. The ascending and transverse colon are within normal limits. The descending and rectosigmoid colon are unremarkable. A moderate amount stool is present throughout the colon. Vascular/Lymphatic: Dense  atherosclerotic calcifications are present within the aorta and branch vessels. There is no significant adenopathy. Reproductive: Within normal limits for age. Other: No significant free fluid is present. Musculoskeletal: A vacuum disc at L5-S1 is stable. And calcification of the L3-4 disc is again noted. Vertebral body heights and alignment are maintained. Moderate central canal and bilateral foraminal stenosis is evident at L4-5. IMPRESSION: 1. Increasing size and number of multiple exophytic lesions involving the left kidney. Recommend MRI of the kidney without with contrast for further evaluation. 2.  Hyperdense lesion at the upper pole of the left kidney has increased in size, now measuring 1.7 x 2.0 cm. 3. Extensive atherosclerotic change without definite stenosis or aneurysm. 4. Mild degenerative changes in the lower lumbar spine are similar to the prior study. Electronically Signed   By: San Morelle M.D.   On: 01/30/2016 11:33   Dg Chest Port 1 View  01/30/2016  CLINICAL DATA:  Shortness of Breath. EXAM: PORTABLE CHEST 1 VIEW COMPARISON:  05/27/2012 FINDINGS: There is hyperinflation of the lungs compatible with COPD. Heart and mediastinal contours are within normal limits. No focal opacities or effusions. No acute bony abnormality. IMPRESSION: COPD.  No active disease. Electronically Signed   By: Rolm Baptise M.D.   On: 01/30/2016 10:26    ROS: General:no colds or fevers, no weight changes- but thin  Skin:no rashes or ulcers HEENT:no blurred vision, no congestion, hard of hearing CV:see HPI PUL:see HPI GI:no diarrhea constipation or melena, no indigestion GU:no hematuria, no dysuria MS:no joint pain, no claudication Neuro:no syncope, no lightheadedness Endo:no diabetes, no thyroid disease  Blood pressure 155/78, pulse 87, temperature 97.4 F (36.3 C), temperature source Axillary, resp. rate 23, height 5' (1.524 m), weight 80 lb (36.288 kg), SpO2 95 %.  Wt Readings from Last 3 Encounters:  01/30/16 80 lb (36.288 kg)  05/29/12 86 lb 13.8 oz (39.4 kg)    PE: General:Pleasant affect, NAD, looks older than her stated age. Skin:Warm and dry, brisk capillary refill HEENT:normocephalic, sclera clear, mucus membranes moist, hard of hearing Neck:supple, no JVD, no bruits  Heart:S1S2 RRR without murmur, gallup, rub or click Lungs:clear without rales, occ rhonchi, no wheezes ZOX:WRUE, non tender, + BS, do not palpate liver spleen or masses Ext:no lower ext edema, 2+ pedal pulses, 2+ radial pulses Neuro:alert and oriented to place and person, confused on day of week but correct on  year. , MAE, follows commands, + facial symmetry   Assessment/Plan  Active Problems:   Chronic diastolic CHF (congestive heart failure) (HCC)   COPD (chronic obstructive pulmonary disease) ([place])   Hypertensive emergency  Elevated troponin- monitor serial troponins.  may need lexiscan myoview.  Check Echo. HTN may have caused demand ischemia.  Dr. Meda Coffee to see.   HTN: continue IV NTG for control.    [place]  Nurse Practitioner Certified [place] Pager [phone] or after 5pm or weekends call [phone] 01/30/2016, 1:49 PM   The patient was seen, examined and discussed with Cecilie Kicks, NP and I agree with the above.   A very pleasant 75 year old female who recently very hard of her hearing and appears much older than stated age who presented with generalized weakness and shortness of breath. The patient has no prior medical history of heart disease. On her prior echocardiogram in 2008 her LVEF was 65-70% with moderate concentric LVH. She doesn't have a cardiologist. Per family patients leave alone and they have noticed that she has been becoming more weak with  decreased appetite to the point that they had to force her to eat anything including medications. Her family states that she has had difficulty walking because of leg weakness and in the last couple weeks they had to help her in and out of bed or get around her house. On presentation her blood pressure was 225, cardiology was consulted for possible STEMI that was canceled. Troponin is 0.09 with slight trend repeat is the same. Her EKG shows sinus rhythm with LVH and ST depression in inferolateral leads that are unchanged from 2013. The patient denies any chest pain or shortness of breath right now she is currently on IV heparin and IV nitroglycerin with blood pressure now in 140s.  Her physical exam shows a cachectic woman that appears much older than stated age, mildly elevated JVDs, no obvious murmur  heard, crackles in the lung bases, and no lower extremity edema.  On her labs there is hypokalemia, acute on chronic kidney failure.  At this point I will treat her for acute CHF most probably diastolic even prior history of significant concentric LVH, I would not send patient for cardiac Focus on treatment of heart failure starting with Lasix 40 mg IV twice a day and follow creatinine and I's and O's closely. I would increase metoprolol to 50 mg by mouth twice a day and add amlodipine 2.5 mg by mouth daily. Replace potassium. Order echocardiogram to evaluate for LVEF, degree of diastolic dysfunction, and filling pressures. Continue heparin iv until troponin is downtrending.  Dorothy Spark 01/30/2016

## 2016-01-30 NOTE — ED Notes (Signed)
Cards MD at bedside

## 2016-01-30 NOTE — Progress Notes (Signed)
ANTICOAGULATION CONSULT NOTE - Initial Consult  Pharmacy Consult for heparin Indication: chest pain/ACS  Allergies  Allergen Reactions  . Morphine And Related Other (See Comments)    "makes me crazy"  . Ciprofloxacin Hives and Rash    Patient Measurements:   Heparin Dosing Weight: 36 kg  Vital Signs: Temp: 97.4 F (36.3 C) (04/03 0912) Temp Source: Axillary (04/03 0912) BP: 215/99 mmHg (04/03 0908) Pulse Rate: 72 (04/03 0908)  Labs:  Recent Labs  01/30/16 0927  HGB 15.0  HCT 44.0  CREATININE 1.90*    CrCl cannot be calculated (Unknown ideal weight.).   Medical History: Past Medical History  Diagnosis Date  . COPD (chronic obstructive pulmonary disease) ([place])   . Rectal adenocarcinoma ([place])   . Lower GI bleed   . Hypertension     Medications:  See EMR  Assessment: 75 yo female to start heparin gtt for ACS. Does not require emergent cath. Body weight verified with pt and family. AKI with Scr wnl > 1.9, eCrCl < 20 ml/min, hgb 12.3, ptls run on low side at baseline. No blood thinners PTA.   Goal of Therapy:  Heparin level 0.3-0.7 units/ml Monitor platelets by anticoagulation protocol: Yes    Plan:  -Heparin bolus 2000 units then 450 units/hr -Daily  HL, CBC -Monitor s/sx bleeding -F/u cardiology plans -First heparin level this afternoon    Harvel Quale 01/30/2016,9:39 AM

## 2016-01-30 NOTE — ED Notes (Signed)
MD at bedside. 

## 2016-01-31 ENCOUNTER — Inpatient Hospital Stay (HOSPITAL_COMMUNITY): Payer: Medicare Other

## 2016-01-31 DIAGNOSIS — Z85048 Personal history of other malignant neoplasm of rectum, rectosigmoid junction, and anus: Secondary | ICD-10-CM

## 2016-01-31 DIAGNOSIS — Q6102 Congenital multiple renal cysts: Secondary | ICD-10-CM

## 2016-01-31 DIAGNOSIS — I5032 Chronic diastolic (congestive) heart failure: Secondary | ICD-10-CM

## 2016-01-31 DIAGNOSIS — I161 Hypertensive emergency: Principal | ICD-10-CM

## 2016-01-31 DIAGNOSIS — I119 Hypertensive heart disease without heart failure: Secondary | ICD-10-CM

## 2016-01-31 DIAGNOSIS — R079 Chest pain, unspecified: Secondary | ICD-10-CM

## 2016-01-31 DIAGNOSIS — I429 Cardiomyopathy, unspecified: Secondary | ICD-10-CM | POA: Diagnosis present

## 2016-01-31 DIAGNOSIS — L8990 Pressure ulcer of unspecified site, unspecified stage: Secondary | ICD-10-CM | POA: Diagnosis present

## 2016-01-31 LAB — VITAMIN D 25 HYDROXY (VIT D DEFICIENCY, FRACTURES): Vit D, 25-Hydroxy: 60.7 ng/mL (ref 30.0–100.0)

## 2016-01-31 LAB — ECHOCARDIOGRAM COMPLETE
Height: 60 in
WEIGHTICAEL: 1280 [oz_av]

## 2016-01-31 LAB — BASIC METABOLIC PANEL
Anion gap: 9 (ref 5–15)
BUN: 28 mg/dL — ABNORMAL HIGH (ref 6–20)
CALCIUM: 11 mg/dL — AB (ref 8.9–10.3)
CHLORIDE: 107 mmol/L (ref 101–111)
CO2: 24 mmol/L (ref 22–32)
CREATININE: 1.62 mg/dL — AB (ref 0.44–1.00)
GFR, EST AFRICAN AMERICAN: 35 mL/min — AB (ref >60)
GFR, EST NON AFRICAN AMERICAN: 30 mL/min — AB (ref >60)
Glucose, Bld: 93 mg/dL (ref 65–99)
Potassium: 3.6 mmol/L (ref 3.5–5.1)
SODIUM: 140 mmol/L (ref 135–145)

## 2016-01-31 LAB — CBC
HCT: 28 % — ABNORMAL LOW (ref 36.0–46.0)
Hemoglobin: 9.4 g/dL — ABNORMAL LOW (ref 12.0–15.0)
MCH: 31.1 pg (ref 26.0–34.0)
MCHC: 33.6 g/dL (ref 30.0–36.0)
MCV: 92.7 fL (ref 78.0–100.0)
PLATELETS: 105 10*3/uL — AB (ref 150–400)
RBC: 3.02 MIL/uL — AB (ref 3.87–5.11)
RDW: 14.4 % (ref 11.5–15.5)
WBC: 4.9 10*3/uL (ref 4.0–10.5)

## 2016-01-31 LAB — HEPARIN LEVEL (UNFRACTIONATED): Heparin Unfractionated: 0.25 IU/mL — ABNORMAL LOW (ref 0.30–0.70)

## 2016-01-31 LAB — MRSA PCR SCREENING: MRSA by PCR: NEGATIVE

## 2016-01-31 LAB — TROPONIN I: Troponin I: 0.12 ng/mL — ABNORMAL HIGH (ref <0.031)

## 2016-01-31 LAB — CALCIUM: Calcium: 11.4 mg/dL — ABNORMAL HIGH (ref 8.9–10.3)

## 2016-01-31 LAB — PARATHYROID HORMONE, INTACT (NO CA): PTH: 11 pg/mL — AB (ref 15–65)

## 2016-01-31 MED ORDER — AMLODIPINE BESYLATE 5 MG PO TABS: 5.0000 mg | ORAL_TABLET | Freq: Every day | ORAL | Status: DC

## 2016-01-31 MED ORDER — AMLODIPINE BESYLATE 10 MG PO TABS
10.0000 mg | ORAL_TABLET | Freq: Every day | ORAL | Status: DC
  Administered 2016-02-01 – 2016-02-04 (×4): 10 mg via ORAL
  Filled 2016-01-31 (×5): qty 1

## 2016-01-31 MED ORDER — HYDRALAZINE HCL 10 MG PO TABS
20.0000 mg | ORAL_TABLET | Freq: Three times a day (TID) | ORAL | Status: DC
  Administered 2016-01-31 – 2016-02-04 (×11): 20 mg via ORAL
  Filled 2016-01-31 (×11): qty 2

## 2016-01-31 MED ORDER — METOPROLOL TARTRATE 50 MG PO TABS
75.0000 mg | ORAL_TABLET | Freq: Two times a day (BID) | ORAL | Status: DC
  Administered 2016-01-31 – 2016-02-05 (×10): 75 mg via ORAL
  Filled 2016-01-31 (×10): qty 1

## 2016-01-31 MED ORDER — AMLODIPINE BESYLATE 2.5 MG PO TABS
2.5000 mg | ORAL_TABLET | Freq: Once | ORAL | Status: AC
  Administered 2016-01-31: 2.5 mg via ORAL
  Filled 2016-01-31: qty 1

## 2016-01-31 MED ORDER — NITROGLYCERIN IN D5W 200-5 MCG/ML-% IV SOLN
0.0000 ug/min | INTRAVENOUS | Status: DC
  Administered 2016-01-31: 5 ug/min via INTRAVENOUS
  Filled 2016-01-31: qty 250

## 2016-01-31 MED ORDER — CETYLPYRIDINIUM CHLORIDE 0.05 % MT LIQD
7.0000 mL | Freq: Two times a day (BID) | OROMUCOSAL | Status: DC
  Administered 2016-01-31 – 2016-02-05 (×10): 7 mL via OROMUCOSAL

## 2016-01-31 MED ORDER — HEPARIN SODIUM (PORCINE) 5000 UNIT/ML IJ SOLN
5000.0000 [IU] | Freq: Three times a day (TID) | INTRAMUSCULAR | Status: DC
  Administered 2016-01-31 – 2016-02-05 (×13): 5000 [IU] via SUBCUTANEOUS
  Filled 2016-01-31 (×10): qty 1

## 2016-01-31 MED ORDER — METOPROLOL TARTRATE 50 MG PO TABS: 75.0000 mg | ORAL_TABLET | Freq: Two times a day (BID) | ORAL | Status: DC

## 2016-01-31 NOTE — Progress Notes (Signed)
PT Cancellation Note  Patient Details Name: [name] MRN: [id number] DOB: [birth year]   Cancelled Treatment:    Reason Eval/Treat Not Completed: Medical issues which prohibited therapy (Troponin trending up and heparin subtherapeutic).  PT will continue to follow acutely.  Collie Siad PT, DPT  Pager: [phone] Phone: [phone] 01/31/2016, 11:53 AM

## 2016-01-31 NOTE — Progress Notes (Addendum)
Notified MD about elevated BP and 2.5mg  Norvasc x1  ordered a I will continue to monitor.

## 2016-01-31 NOTE — Progress Notes (Signed)
ANTICOAGULATION CONSULT NOTE - Follow up [place] for heparin Indication: chest pain/ACS  Allergies  Allergen Reactions  . Morphine And Related Other (See Comments)    "makes me crazy"  . Ciprofloxacin Hives and Rash    Patient Measurements: Height: 5' (152.4 cm) Weight: 80 lb (36.288 kg) IBW/kg (Calculated) : 45.5 Heparin Dosing Weight: 36 kg  Vital Signs: Temp: 99 F (37.2 C) (04/04 0802) Temp Source: Oral (04/04 0802) BP: 178/80 mmHg (04/04 0645) Pulse Rate: 75 (04/04 0645)  Labs:  Recent Labs  01/30/16 0908 01/30/16 0927 01/30/16 1648 01/30/16 1823 01/30/16 2153 01/31/16 0330  HGB 12.3 15.0  --   --   --  9.4*  HCT 36.8 44.0  --   --   --  28.0*  PLT 127*  --   --   --   --  105*  APTT 31  --   --   --   --   --   LABPROT 14.6  --   --   --   --   --   INR 1.12  --   --   --   --   --   HEPARINUNFRC  --   --   --  0.37  --  0.25*  CREATININE 1.77* 1.90*  --   --   --   --   TROPONINI  --   --  0.09*  --  0.10* 0.12*    Estimated Creatinine Clearance: 14.7 mL/min (by C-G formula based on Cr of 1.9).   Medical History: Past Medical History  Diagnosis Date  . COPD (chronic obstructive pulmonary disease) ([place])   . Rectal adenocarcinoma ([place])   . Lower GI bleed   . Hypertension     Medications:  See EMR  Assessment: 75 yo female to start heparin gtt for ACS. Does not require emergent cath. Body weight verified with pt and family. AKI with Scr wnl > 1.9, eCrCl < 20 ml/min. Heparin level subtherapeutic at 0.25  (goal 0.3-0.7) on 450 units/hr of heparin. No sxs of bleeding.   Goal of Therapy:  Heparin level 0.3-0.7 units/ml Monitor platelets by anticoagulation protocol: Yes    Plan:  -Increase heparin to 500 units/hr and check 8 hr heparin level -Daily  HL, CBC -Monitor s/sx bleeding -Noted plans to continue heparin until troponin down trending   Thank you for allowing [place] to participate in this patients care. Jens Som,  PharmD Pager: [phone] 01/31/2016, 8:08 AM

## 2016-01-31 NOTE — Care Management Note (Signed)
Case Management Note  Patient Details  Name: [name] MRN: [id number] Date of Birth: 01-21-1941  Subjective/Objective:     Patient lives at home with son, on nitro drip, bp has been very high,  Await pt eval, NCM will cont to follow for dc needs.               Action/Plan:   Expected Discharge Date:                  Expected Discharge Plan:  [place]  In-House Referral:     Discharge planning Services  CM Consult  Post Acute Care Choice:    Choice offered to:     DME Arranged:    DME Agency:     HH Arranged:    [place] Agency:     Status of Service:  In process, will continue to follow  Medicare Important Message Given:    Date Medicare IM Given:    Medicare IM give by:    Date Additional Medicare IM Given:    Additional Medicare Important Message give by:     If discussed at [place] of Stay Meetings, dates discussed:    Additional Comments:  Kon[place], [place], RN 01/31/2016, 4:09 PM

## 2016-01-31 NOTE — Progress Notes (Signed)
[facility] PROGRESS NOTE  [name] ZOX:[id number] DOB: 10-14-1941 DOA: 01/30/2016 PCP: Shirline Frees, MD Admit HPI / Brief Narrative: [name] is a 75 y.o. female PMHx  COPD, HTN, HLD, Colorectal Adenocarcinoma.  Patient brought to the ED this morning for weakness and confusion. Per family she's had excessive weight loss. EKG noted by EMS, code STEMI called. EDP spoke with cardiology who will see the patient. Patient with significantly elevated blood pressure on arrival. Initial troponin 0.09. Patient started on heparin drip and nitroglycerin drip, BP improved from initial reading of 215/ 99 to 125/70.  Patient denies chest pain. She mainly complains of excessive sleepiness. She uses inhalers at home, denies cough or shortness of breath. Her appetite is poor and patient reported excessive weight loss over last few months.    HPI/Subjective: 4/4 A/O 1 (does not know where, when, why), NAD    Assessment/Plan:  Elevated troponin - 0.09 x2 with ST abnormalities on EKG. Code STEMI called in field but didn't meet STEMI criteria. Given ASA and started on NTG / heparin gtt. Trop elevation and EKG changes possibly secondary to hypertensive emergency. Bedside echo in ED revealed grossly normal EF. -admit to stepdown -Cardiology has evaluated and declared no STEMI.  -cycle troponins; elevated, demand ischemia? Patient's BP still not well-controlled -echocardiogram; dilated cardiomyopathy see results below  Hypertensive emergency/dilated cardiomyopathy  -Nitroglycerin drip GOAL:  Chest pain resolution, SBP 145-160. -Amlodipine 10 mg -Hydralazine 20 mg TID -Metoprolol 75 mg BID  Chronic diastolic heart failure.  No evidence for heart failure on exam or CXR.  -Daily weights Weight change:   -Strict in and out  AKI. (Baseline Cr 1.07),  -. AKI in setting of hypertensive emergency. Should improve with treatment of HTN -Slowly improving -hold ACEI -Normal saline  23m/hr  Hypercalcemia,/multiple myeloma?  - Ca= 13.7. Ionized Ca+ 1.82. Albumin 3.1. Can't exclude underlying malignancy.  -obtain PTH, Vit D level PTH-rp.  -She is taking Calcium at home - will hold -IV hydration -repeat calcium in am -ESR, SPEP, UPEP pending  Weight loss.  -Etiology not yet clear. No malignancy seen on abd/pelvis CTscan though was without contrast.  -continue home Ensure BID  Lower abdominal pain.  -Noncontrast CT scan unrevealing. She does have moderate stool in colon on imaging so pain could be from constipation. There is a small mass in RLQ but seems superficial, could be stool. Nothing described on CTscan.  -Miralax BID if can tolerate.   Thrombocytopenia., chronic. Stable count at 127.   Enlarging renal cysts.  -Increase in size and number of left renal cysts on CTscan. Probably needs MRI for further evaluation (when acute issues resolve)  COPD.  -CXR negative. Normal sats. -continue home inhalers  Hx of coloectal cancer, s/p LAR / chemoradiation in 2008.  Goal of care -Consulted Palliative Care; patient with MSOF discussed with HCPOA CODE STATUS, short-term vs long-term goals of care    Code Status: Full code Family Communication: None available Disposition Plan: Will await recommendations from palliative care    Consultants: Palliative care pending  Procedures: 4/3 CT abdomen pelvis without contrast;Increasing size and number of multiple exophytic lesions involving the left kidney. Recommend MRI of the kidney without with contrast for further evaluation. 2. Hyperdense lesion at the upper pole of the left kidney has increased in size, now measuring 1.7 x 2.0 cm. 3. Extensive atherosclerotic change without definite stenosis or aneurysm. 4. Mild degenerative changes in the lower lumbar spine are similar to the prior study. 4/4  echocardiogram; Left ventricle: severe LVH.-LVEF=65%-70%. -(grade 1 diastolic dysfunction).  - Left atrium:  severely dilated. - Pericardium, extracardiac: A small pericardial effusion     Cultures 4/3 MRSA by PCR negative  Antibiotics: NA   DVT prophylaxis Subcutaneous heparin    Objective: Filed Vitals:   01/31/16 1700 01/31/16 1900 01/31/16 2000 01/31/16 2040  BP: 162/69 220/93 211/105   Pulse: 70 90 96   Temp:      TempSrc:      Resp: 27 34 38   Height:      Weight:      SpO2: 95% 97% 97% 98%    Intake/Output Summary (Last 24 hours) at 01/31/16 2203 Last data filed at 01/31/16 1800  Gross per 24 hour  Intake 1673.68 ml  Output    300 ml  Net 1373.68 ml   Filed Weights   01/30/16 0912  Weight: 36.288 kg (80 lb)     Exam: General: A/O 1 (does not know where, when, why), NAD, No acute respiratory distress Eyes: negative double vision,negative scleral hemorrhage ENT: Negative Runny nose, negative gingival bleeding, Neck:  Negative scars, masses, torticollis, lymphadenopathy, JVD Lungs: Clear to auscultation bilaterally without wheezes or crackles Cardiovascular: Regular rate and rhythm without murmur gallop or rub normal S1 and S2 Abdomen:negative abdominal pain, negative dysphagia, nondistended, positive soft, bowel sounds, no rebound, no ascites, no appreciable mass Extremities: No significant cyanosis, clubbing, or edema bilateral lower extremities Psychiatric:  Negative depression, negative anxiety, negative fatigue, negative mania  Neurologic:  Cranial nerves II through XII intact, tongue/uvula midline, all extremities muscle strength 5/5, sensation intact throughout, negative dysarthria, negative expressive aphasia, negative receptive aphasia.     Data Reviewed: Basic Metabolic Panel:  Recent Labs Lab 01/30/16 0908 01/30/16 0927 01/31/16 0330 01/31/16 0852  NA 139 139  --  140  K 3.2* 3.2*  --  3.6  CL 99* 96*  --  107  CO2 32  --   --  24  GLUCOSE 96 93  --  93  BUN 23* 26*  --  28*  CREATININE 1.77* 1.90*  --  1.62*  CALCIUM 13.7*  --  11.4*  11.0*   Liver Function Tests:  Recent Labs Lab 01/30/16 0908  AST 38  ALT 14  ALKPHOS 66  BILITOT 0.6  PROT 6.5  ALBUMIN 3.1*   No results for input(s): LIPASE, AMYLASE in the last 168 hours. No results for input(s): AMMONIA in the last 168 hours. CBC:  Recent Labs Lab 01/30/16 0908 01/30/16 0927 01/31/16 0330  WBC 4.3  --  4.9  HGB 12.3 15.0 9.4*  HCT 36.8 44.0 28.0*  MCV 92.5  --  92.7  PLT 127*  --  105*   Cardiac Enzymes:  Recent Labs Lab 01/30/16 1648 01/30/16 2153 01/31/16 0330  TROPONINI 0.09* 0.10* 0.12*   BNP (last 3 results) No results for input(s): BNP in the last 8760 hours.  ProBNP (last 3 results) No results for input(s): PROBNP in the last 8760 hours.  CBG: No results for input(s): GLUCAP in the last 168 hours.  Recent Results (from the past 240 hour(s))  MRSA PCR Screening     Status: None   Collection Time: 01/30/16  7:00 PM  Result Value Ref Range Status   MRSA by PCR NEGATIVE NEGATIVE Final    Comment:        The GeneXpert MRSA Assay (FDA approved for NASAL specimens only), is one component of a comprehensive MRSA colonization surveillance program. It  is not intended to diagnose MRSA infection nor to guide or monitor treatment for MRSA infections.      Studies: Ct Abdomen Pelvis Wo Contrast  01/30/2016  CLINICAL DATA:  Lower abdominal pain for 1 week. EXAM: CT ABDOMEN AND PELVIS WITHOUT CONTRAST TECHNIQUE: Multidetector CT imaging of the abdomen and pelvis was performed following the standard protocol without IV contrast. COMPARISON:  CT of the abdomen and pelvis with contrast 06/05/2011 FINDINGS: Lower chest: The lung bases are clear without focal nodule, mass, or airspace disease. The heart size is normal. A pericardial effusion is present. No significant pleural effusions are present. Hepatobiliary: No focal hepatic lesions are present. The common bile duct and gallbladder are normal. Pancreas: The pancreas is within normal  limits. Spleen: The spleen is unremarkable. Adrenals/Urinary Tract: The adrenal glands are normal bilaterally. Multiple exophytic lesions of the left kidney have increased in size the largest low-density lesion is a lower pole, measuring 2.3 cm. There are several hyperdense lesions now present. The largest is at the left upper pole measured 1.7 x 2.0 cm. This lesion was more hypodense in 2012. Ureters are within normal limits. The urinary bladder is unremarkable. Stomach/Bowel: The stomach and duodenum are within normal limits. The small bowel is unremarkable. The appendix is not discretely visualized and may be surgically absent. The ascending and transverse colon are within normal limits. The descending and rectosigmoid colon are unremarkable. A moderate amount stool is present throughout the colon. Vascular/Lymphatic: Dense atherosclerotic calcifications are present within the aorta and branch vessels. There is no significant adenopathy. Reproductive: Within normal limits for age. Other: No significant free fluid is present. Musculoskeletal: A vacuum disc at L5-S1 is stable. And calcification of the L3-4 disc is again noted. Vertebral body heights and alignment are maintained. Moderate central canal and bilateral foraminal stenosis is evident at L4-5. IMPRESSION: 1. Increasing size and number of multiple exophytic lesions involving the left kidney. Recommend MRI of the kidney without with contrast for further evaluation. 2. Hyperdense lesion at the upper pole of the left kidney has increased in size, now measuring 1.7 x 2.0 cm. 3. Extensive atherosclerotic change without definite stenosis or aneurysm. 4. Mild degenerative changes in the lower lumbar spine are similar to the prior study. Electronically Signed   By: San Morelle M.D.   On: 01/30/2016 11:33   Dg Chest Port 1 View  01/31/2016  CLINICAL DATA:  Tachypnea EXAM: PORTABLE CHEST - 1 VIEW COMPARISON:  Two-view chest x-ray 01/30/2016 FINDINGS: The  heart size is normal. There is no edema or effusion to suggest failure. Emphysema is again noted. Scarring is noted at the lung apices bilaterally. IMPRESSION: 1. Emphysema. 2. No acute cardiopulmonary disease. Electronically Signed   By: San Morelle M.D.   On: 01/31/2016 07:20   Dg Chest Port 1 View  01/30/2016  CLINICAL DATA:  Shortness of Breath. EXAM: PORTABLE CHEST 1 VIEW COMPARISON:  05/27/2012 FINDINGS: There is hyperinflation of the lungs compatible with COPD. Heart and mediastinal contours are within normal limits. No focal opacities or effusions. No acute bony abnormality. IMPRESSION: COPD.  No active disease. Electronically Signed   By: Rolm Baptise M.D.   On: 01/30/2016 10:26    Scheduled Meds: . [START ON 02/01/2016] amLODipine  10 mg Oral Daily  . antiseptic oral rinse  7 mL Mouth Rinse BID  . aspirin  324 mg Oral Once  . feeding supplement (ENSURE ENLIVE)  237 mL Oral BID BM  . heparin subcutaneous  5,000 Units  Subcutaneous 3 times per day  . hydrALAZINE  20 mg Oral TID  . metoprolol tartrate  75 mg Oral BID  . mometasone-formoterol  2 puff Inhalation BID  . nitroGLYCERIN  0-200 mcg/min Intravenous Once  . tiotropium  18 mcg Inhalation Daily   Continuous Infusions: . sodium chloride 50 mL/hr at 01/31/16 0604  . nitroGLYCERIN 5 mcg/min (01/31/16 2156)    Active Problems:   Chronic diastolic CHF (congestive heart failure) (HCC)   COPD (chronic obstructive pulmonary disease) (HCC)   History of rectal cancer   Malnutrition of moderate degree (HCC)   Hypertensive emergency   AKI (acute kidney injury) ([place])   Weight loss   Multiple renal cysts   Hypercalcemia   Pressure ulcer   Cardiomyopathy ([place])   Chronic diastolic congestive heart failure ([place])    Time spent: 40 minutes    WO[facility], [facility] Pager [phone]. If 7PM-7AM, please contact night-coverage at [email or web address], password [facility] 01/31/2016, 10:03 PM  LOS: 1 day    Care during the  described time interval was provided by me .  I have reviewed this patient's available data, including medical history, events of note, physical examination, and all test results as part of my evaluation. I have personally reviewed and interpreted all radiology studies.   Dia Crawford, MD [phone] Pager

## 2016-01-31 NOTE — Progress Notes (Signed)
Patient Name: [name] Date of Encounter: 01/31/2016  Active Problems:   Chronic diastolic CHF (congestive heart failure) (HCC)   COPD (chronic obstructive pulmonary disease) ([place])   History of rectal cancer   Malnutrition of moderate degree ([place])   Hypertensive emergency   AKI (acute kidney injury) ([place])   Weight loss   Multiple renal cysts   Hypercalcemia   Length of Stay: 1  SUBJECTIVE  She denies chest pain or SOB, appears somnolent.  CURRENT MEDS . amLODipine  2.5 mg Oral Daily  . antiseptic oral rinse  7 mL Mouth Rinse BID  . aspirin  324 mg Oral Once  . feeding supplement (ENSURE ENLIVE)  237 mL Oral BID BM  . hydrALAZINE  10 mg Oral TID  . metoprolol tartrate  50 mg Oral BID  . mometasone-formoterol  2 puff Inhalation BID  . nitroGLYCERIN  0-200 mcg/min Intravenous Once  . tiotropium  18 mcg Inhalation Daily   OBJECTIVE  Filed Vitals:   01/31/16 1000 01/31/16 1100 01/31/16 1115 01/31/16 1122  BP: 156/76 157/72 159/71   Pulse: 75 69 68   Temp:    97.5 F (36.4 C)  TempSrc:    Oral  Resp: 34 30 23   Height:      Weight:      SpO2: 94% 94% 95%     Intake/Output Summary (Last 24 hours) at 01/31/16 1400 Last data filed at 01/31/16 1100  Gross per 24 hour  Intake 2322.24 ml  Output     75 ml  Net 2247.24 ml   Filed Weights   01/30/16 0912  Weight: 80 lb (36.288 kg)    PHYSICAL EXAM  General: cachectic Neuro: Alert and oriented X 3. Moves all extremities spontaneously. Psych: Normal affect. HEENT:  Normal  Neck: Supple without bruits or JVD. Lungs:  Resp regular and unlabored, CTA. Heart: RRR no s3, s4, 2/6 systolic murmur Abdomen: Soft, non-tender, non-distended, BS + x 4.  Extremities: No clubbing, cyanosis or edema. DP/PT/Radials 2+ and equal bilaterally. Pressure ulcer on her sacral bone Accessory Clinical Findings  CBC  Recent Labs  01/30/16 0908 01/30/16 0927 01/31/16 0330  WBC 4.3  --  4.9  HGB 12.3 15.0 9.4*  HCT 36.8  44.0 28.0*  MCV 92.5  --  92.7  PLT 127*  --  123456*   Basic Metabolic Panel  Recent Labs  01/30/16 0908 01/30/16 0927 01/31/16 0330 01/31/16 0852  NA 139 139  --  140  K 3.2* 3.2*  --  3.6  CL 99* 96*  --  107  CO2 32  --   --  24  GLUCOSE 96 93  --  93  BUN 23* 26*  --  28*  CREATININE 1.77* 1.90*  --  1.62*  CALCIUM 13.7*  --  11.4* 11.0*   Liver Function Tests  Recent Labs  01/30/16 0908  AST 38  ALT 14  ALKPHOS 66  BILITOT 0.6  PROT 6.5  ALBUMIN 3.1*    Recent Labs  01/30/16 1648 01/30/16 2153 01/31/16 0330  TROPONINI 0.09* 0.10* 0.12*   Radiology/Studies  Ct Abdomen Pelvis Wo Contrast  01/30/2016  CLINICAL DATA:  Lower abdominal pain for 1 week.  IMPRESSION: 1. Increasing size and number of multiple exophytic lesions involving the left kidney. Recommend MRI of the kidney without with contrast for further evaluation. 2. Hyperdense lesion at the upper pole of the left kidney has increased in size, now measuring 1.7 x 2.0  cm. 3. Extensive atherosclerotic change without definite stenosis or aneurysm. 4. Mild degenerative changes in the lower lumbar spine are similar to the prior study. Electronically Signed   By: San Morelle M.D.   On: 01/30/2016 11:33   Dg Chest Port 1 View  01/31/2016  CLINICAL DATA:  Tachypnea EXAM: PORTABLE CHEST - 1 VIEW COMPARISON:  Two-view chest x-ray 01/30/2016 FINDINGS: The heart size is normal. There is no edema or effusion to suggest failure. Emphysema is again noted. Scarring is noted at the lung apices bilaterally. IMPRESSION: 1. Emphysema. 2. No acute cardiopulmonary disease. Electronically Signed   By: San Morelle M.D.   On: 01/31/2016 07:20   Dg Chest Port 1 View  01/30/2016  CLINICAL DATA:  Shortness of Breath. EXAM: PORTABLE CHEST 1 VIEW COMPARISON:  05/27/2012 FINDINGS: There is hyperinflation of the lungs compatible with COPD. Heart and mediastinal contours are within normal limits. No focal opacities or effusions.  No acute bony abnormality. IMPRESSION: COPD.  No active disease. Electronically Signed   By: Rolm Baptise M.D.   On: 01/30/2016 10:26   TELE: SR  TTE; 01/31/2016 - Left ventricle: The cavity size was normal. Wall thickness was  increased in a pattern of severe LVH. Systolic function was  vigorous. The estimated ejection fraction was in the range of 65%  to 70%. Wall motion was normal; there were no regional wall  motion abnormalities. Doppler parameters are consistent with  abnormal left ventricular relaxation (grade 1 diastolic  dysfunction). Doppler parameters are consistent with high  ventricular filling pressure. - Mitral valve: Severely calcified annulus. The findings are  consistent with mild stenosis. There was mild regurgitation.  Valve area by pressure half-time: 1.5 cm^2. - Left atrium: The atrium was severely dilated. - Pericardium, extracardiac: A small pericardial effusion was  identified.  Impressions: - Vigorous LV function with cavity obliteration in systole; severe  LVH; grade 1 diastolic dysfunction with elevated LV filling  pressuere; severe LAE; severe MAC with mild MS (mean gradient 7  mmHg; MVA 1.5 cm2); mild MR.    ASSESSMENT AND PLAN  Active Problems:   Hypertensive emergency      Elevated troponinChronic diastolic CHF (congestive heart failure) (HCC)     COPD (chronic obstructive pulmonary disease) (HCC)      Malnutrition  A very pleasant 75 year old female who is hard of hearing and appears much older than stated age who presented with generalized weakness and shortness of breath. The patient has no prior medical history of heart disease. On her prior echocardiogram in 2008 her LVEF was 65-70% with moderate concentric LVH. She doesn't have a cardiologist. Per family patients leave alone and they have noticed that she has been becoming more weak with decreased appetite to the point that they had to force her to eat anything including  medications. Her family states that she has had difficulty walking because of leg weakness and in the last couple weeks they had to help her in and out of bed or get around her house. On presentation her blood pressure was 225, cardiology was consulted for possible STEMI that was canceled.   Troponin is 0.09  --> 0.10 ---> 0.12, this is most probably sec to acute on chronic kidney failure. No cath indicated as the patient has no chest pain and her LVEF is hyperdynamic with no regional wall motion abnormalities.  She has severe concentric LVH - and hypertensive heart disease as a result without signs of heart failure. I will further increase amlodipine  to 5 mg po daily, giving additional 2.5 mg today.  She appears cachectic, workup for potential underlying disease ? malnutrition ? Cancer to be done by primary team.  Her heparin iv can be discontinued.  Signed, Dorothy Spark MD, [facility] 01/31/2016

## 2016-01-31 NOTE — Progress Notes (Signed)
Echocardiogram 2D Echocardiogram has been performed.  Tresa Res 01/31/2016, 12:08 PM

## 2016-02-01 ENCOUNTER — Inpatient Hospital Stay (HOSPITAL_COMMUNITY): Payer: Medicare Other

## 2016-02-01 DIAGNOSIS — E440 Moderate protein-calorie malnutrition: Secondary | ICD-10-CM

## 2016-02-01 DIAGNOSIS — I429 Cardiomyopathy, unspecified: Secondary | ICD-10-CM

## 2016-02-01 DIAGNOSIS — J438 Other emphysema: Secondary | ICD-10-CM

## 2016-02-01 LAB — CBC
HCT: 27.9 % — ABNORMAL LOW (ref 36.0–46.0)
HEMOGLOBIN: 8.9 g/dL — AB (ref 12.0–15.0)
MCH: 29.8 pg (ref 26.0–34.0)
MCHC: 31.9 g/dL (ref 30.0–36.0)
MCV: 93.3 fL (ref 78.0–100.0)
Platelets: 104 10*3/uL — ABNORMAL LOW (ref 150–400)
RBC: 2.99 MIL/uL — AB (ref 3.87–5.11)
RDW: 14.7 % (ref 11.5–15.5)
WBC: 4.3 10*3/uL (ref 4.0–10.5)

## 2016-02-01 LAB — CALCIUM, IONIZED: Calcium, Ionized, Serum: 6.8 mg/dL — ABNORMAL HIGH (ref 4.5–5.6)

## 2016-02-01 LAB — PREALBUMIN: Prealbumin: 13.7 mg/dL — ABNORMAL LOW (ref 18–38)

## 2016-02-01 MED ORDER — ISOSORBIDE MONONITRATE ER 30 MG PO TB24
30.0000 mg | ORAL_TABLET | Freq: Every day | ORAL | Status: DC
  Administered 2016-02-01 – 2016-02-05 (×5): 30 mg via ORAL
  Filled 2016-02-01 (×5): qty 1

## 2016-02-01 MED ORDER — SODIUM CHLORIDE 0.9 % IV SOLN
90.0000 mg | Freq: Once | INTRAVENOUS | Status: AC
  Administered 2016-02-01: 90 mg via INTRAVENOUS
  Filled 2016-02-01: qty 10

## 2016-02-01 NOTE — Progress Notes (Signed)
Updated daughter, [name], on patient's condition of shortness of breath and anxiety.

## 2016-02-01 NOTE — Clinical Documentation Improvement (Signed)
Internal Medicine  Can the diagnosis of pressure ulcer be further specified by site and stage ?  Thank you    Document Site with laterality - Elbow, Back (upper/lower), Sacral, Hip, Buttock, Ankle, Heel, Head, Other (Specify)  Pressure Ulcer Stage - Stage1, Stage 2, Stage 3, Stage 4, Unstageable, Unspecified, Unable to Clinically Determine  Other  Clinically Undetermined    Supporting Information: Nursing Assessment : " Stage II to coccyx"   Please exercise your independent, professional judgment when responding. A specific answer is not anticipated or expected.   Thank You,  [place] [phone]

## 2016-02-01 NOTE — Progress Notes (Addendum)
Initial Nutrition Assessment  DOCUMENTATION CODES:   Severe malnutrition in context of chronic illness, Underweight  INTERVENTION:    Continue Ensure Enlive PO BID, each supplement provides 350 kcal and 20 grams of protein  NUTRITION DIAGNOSIS:   Malnutrition related to chronic illness as evidenced by severe depletion of body fat, severe depletion of muscle mass.  GOAL:   Patient will meet greater than or equal to 90% of their needs  MONITOR:   PO intake, Supplement acceptance, Weight trends, Skin  REASON FOR ASSESSMENT:   Malnutrition Screening Tool    ASSESSMENT:   75 y.o. female with PMH of COPD, HTN, HLD, colorectal adenocarcinoma. She was brought to the ED on 4/3 AM for weakness and confusion, hypertensive emergency.Per family she's had a poor appetite and excessive weight loss. Code STEMI called.   Patient reports recent poor intake. She reports her usual weight is ~75 lbs, stating, "my weight and my age are about the same now." She drinks Ensure at home. Nutrition-Focused physical exam completed. Findings are severe fat depletion, severe muscle depletion, and no edema. Patient with severe PCM. Palliative Care team has been consulted with plans for a family meeting on 4/6.   Diet Order:  DIET SOFT Room service appropriate?: Yes; Fluid consistency:: Thin  Skin:  Wound (see comment) (stage II pressure injury to coccyx)  Last BM:  4/4  Height:   Ht Readings from Last 1 Encounters:  01/30/16 5' (1.524 m)    Weight:   Wt Readings from Last 1 Encounters:  02/01/16 69 lb 3.6 oz (31.4 kg)    Ideal Body Weight:  45.5 kg  BMI:  Body mass index is 13.52 kg/(m^2).  Estimated Nutritional Needs:   Kcal:  1000-1200  Protein:  50-60 gm  Fluid:  >/=1.2 L  EDUCATION NEEDS:   No education needs identified at this time  Molli Barrows, [place], [place], [place] Pager [phone] After Hours Pager [phone]

## 2016-02-01 NOTE — Progress Notes (Signed)
Patient stated she and her husband had talked about how much they would or would not want done toward the end of life.  She stated both of them had said they did not want to be put on machines.

## 2016-02-01 NOTE — Progress Notes (Signed)
Patient Name: [name] Date of Encounter: 02/01/2016  Active Problems:   Chronic diastolic CHF (congestive heart failure) (HCC)   COPD (chronic obstructive pulmonary disease) ([place])   History of rectal cancer   Malnutrition of moderate degree ([place])   Hypertensive emergency   AKI (acute kidney injury) ([place])   Weight loss   Multiple renal cysts   Hypercalcemia   Pressure ulcer   Cardiomyopathy ([place])   Chronic diastolic congestive heart failure ([place])   Length of Stay: 2  SUBJECTIVE  The patient states that she feels better today, still ha some SOB. No chest pain.  CURRENT MEDS . amLODipine  10 mg Oral Daily  . antiseptic oral rinse  7 mL Mouth Rinse BID  . aspirin  324 mg Oral Once  . feeding supplement (ENSURE ENLIVE)  237 mL Oral BID BM  . heparin subcutaneous  5,000 Units Subcutaneous 3 times per day  . hydrALAZINE  20 mg Oral TID  . metoprolol tartrate  75 mg Oral BID  . mometasone-formoterol  2 puff Inhalation BID  . nitroGLYCERIN  0-200 mcg/min Intravenous Once  . tiotropium  18 mcg Inhalation Daily   OBJECTIVE  Filed Vitals:   02/01/16 0600 02/01/16 0700 02/01/16 0800 02/01/16 0808  BP: 171/72 142/66 120/57   Pulse: 70 72 57   Temp:    97.5 F (36.4 C)  TempSrc:    Oral  Resp: 33 28 23   Height:      Weight:      SpO2: 97% 98% 99%     Intake/Output Summary (Last 24 hours) at 02/01/16 0835 Last data filed at 01/31/16 1800  Gross per 24 hour  Intake 311.92 ml  Output    300 ml  Net  11.92 ml   Filed Weights   01/30/16 0912 02/01/16 0528  Weight: 80 lb (36.288 kg) 69 lb 3.6 oz (31.4 kg)   PHYSICAL EXAM  General: cachectic Neuro: Alert and oriented X 3. Moves all extremities spontaneously. Psych: Normal affect. HEENT:  Normal  Neck: Supple without bruits or JVD. Lungs:  Resp regular and unlabored, CTA. Heart: RRR no s3, s4, 2/6 systolic murmur Abdomen: Soft, non-tender, non-distended, BS + x 4.  Extremities: No clubbing, cyanosis or  edema. DP/PT/Radials 2+ and equal bilaterally. Pressure ulcer on her sacral bone Accessory Clinical Findings  CBC  Recent Labs  01/31/16 0330 02/01/16 0350  WBC 4.9 4.3  HGB 9.4* 8.9*  HCT 28.0* 27.9*  MCV 92.7 93.3  PLT 105* 123456*   Basic Metabolic Panel  Recent Labs  01/30/16 0908 01/30/16 0927 01/31/16 0330 01/31/16 0852  NA 139 139  --  140  K 3.2* 3.2*  --  3.6  CL 99* 96*  --  107  CO2 32  --   --  24  GLUCOSE 96 93  --  93  BUN 23* 26*  --  28*  CREATININE 1.77* 1.90*  --  1.62*  CALCIUM 13.7*  --  11.4* 11.0*   Liver Function Tests  Recent Labs  01/30/16 0908  AST 38  ALT 14  ALKPHOS 66  BILITOT 0.6  PROT 6.5  ALBUMIN 3.1*    Recent Labs  01/30/16 1648 01/30/16 2153 01/31/16 0330  TROPONINI 0.09* 0.10* 0.12*   Radiology/Studies  Ct Abdomen Pelvis Wo Contrast  01/30/2016  CLINICAL DATA:  Lower abdominal pain for 1 week.  IMPRESSION: 1. Increasing size and number of multiple exophytic lesions involving the left kidney. Recommend MRI  of the kidney without with contrast for further evaluation. 2. Hyperdense lesion at the upper pole of the left kidney has increased in size, now measuring 1.7 x 2.0 cm. 3. Extensive atherosclerotic change without definite stenosis or aneurysm. 4. Mild degenerative changes in the lower lumbar spine are similar to the prior study. Electronically Signed   By: San Morelle M.D.   On: 01/30/2016 11:33   Dg Chest Port 1 View  01/31/2016  CLINICAL DATA:  Tachypnea EXAM: PORTABLE CHEST - 1 VIEW COMPARISON:  Two-view chest x-ray 01/30/2016 FINDINGS: The heart size is normal. There is no edema or effusion to suggest failure. Emphysema is again noted. Scarring is noted at the lung apices bilaterally. IMPRESSION: 1. Emphysema. 2. No acute cardiopulmonary disease. Electronically Signed   By: San Morelle M.D.   On: 01/31/2016 07:20   Dg Chest Port 1 View  01/30/2016  CLINICAL DATA:  Shortness of Breath. EXAM: PORTABLE  CHEST 1 VIEW COMPARISON:  05/27/2012 FINDINGS: There is hyperinflation of the lungs compatible with COPD. Heart and mediastinal contours are within normal limits. No focal opacities or effusions. No acute bony abnormality. IMPRESSION: COPD.  No active disease. Electronically Signed   By: Rolm Baptise M.D.   On: 01/30/2016 10:26   TELE: SR  TTE; 01/31/2016 - Left ventricle: The cavity size was normal. Wall thickness was  increased in a pattern of severe LVH. Systolic function was  vigorous. The estimated ejection fraction was in the range of 65%  to 70%. Wall motion was normal; there were no regional wall  motion abnormalities. Doppler parameters are consistent with  abnormal left ventricular relaxation (grade 1 diastolic  dysfunction). Doppler parameters are consistent with high  ventricular filling pressure. - Mitral valve: Severely calcified annulus. The findings are  consistent with mild stenosis. There was mild regurgitation.  Valve area by pressure half-time: 1.5 cm^2. - Left atrium: The atrium was severely dilated. - Pericardium, extracardiac: A small pericardial effusion was  identified.  Impressions: - Vigorous LV function with cavity obliteration in systole; severe  LVH; grade 1 diastolic dysfunction with elevated LV filling  pressuere; severe LAE; severe MAC with mild MS (mean gradient 7  mmHg; MVA 1.5 cm2); mild MR.    ASSESSMENT AND PLAN  Active Problems:   Hypertensive emergency      Elevated troponinChronic diastolic CHF (congestive heart failure) (HCC)     COPD (chronic obstructive pulmonary disease) (HCC)      Malnutrition  A very pleasant 75 year old female who is hard of hearing and appears much older than stated age who presented with generalized weakness and shortness of breath. The patient has no prior medical history of heart disease. On her prior echocardiogram in 2008 her LVEF was 65-70% with moderate concentric LVH. She doesn't have a  cardiologist. Per family patients leave alone and they have noticed that she has been becoming more weak with decreased appetite to the point that they had to force her to eat anything including medications. Her family states that she has had difficulty walking because of leg weakness and in the last couple weeks they had to help her in and out of bed or get around her house. On presentation her blood pressure was 225, cardiology was consulted for possible STEMI that was canceled.   Troponin is 0.09  --> 0.10 ---> 0.12, this is most probably sec to acute on chronic kidney failure. No cath indicated as the patient has no chest pain and her LVEF is hyperdynamic with  no regional wall motion abnormalities.  She has severe concentric LVH - and hypertensive heart disease as a result without signs of heart failure. Continue current antihypertensive regimen, BP much improved, still elevated, i would start imdur 30 mg po daily and wean off NTG drip. She appears cachectic, workup for potential underlying disease ? malnutrition ? Cancer to be done by primary team.  Her heparin was discontinued.  Signed, Dorothy Spark MD, [facility] 02/01/2016

## 2016-02-01 NOTE — Progress Notes (Signed)
Dr. Fanny Bien text paged to inform her patient is having air hunger and anxiety.  Awaiting call back.

## 2016-02-01 NOTE — Progress Notes (Signed)
[facility] PROGRESS NOTE  SHARAN SIRAGUSA [id number] DOB: 01-02-1941 DOA: 01/30/2016 PCP: Shirline Frees, MD Admit HPI / Brief Narrative: [name] is a 75 y.o. female PMHx  COPD, HTN, HLD, Colorectal Adenocarcinoma.  Patient brought to the ED this morning for weakness and confusion. Per family she's had excessive weight loss. EKG noted by EMS, code STEMI called. EDP spoke with cardiology who will see the patient. Patient with significantly elevated blood pressure on arrival. Initial troponin 0.09. Patient started on heparin drip and nitroglycerin drip, BP improved from initial reading of 215/ 99 to 125/70.  Patient denies chest pain. She mainly complains of excessive sleepiness. She uses inhalers at home, denies cough or shortness of breath. Her appetite is poor and patient reported excessive weight loss over last few months.   HPI/Subjective: Confusion better per daughter, no complaints  Assessment/Plan:  Metabolic encephalopathy -Suspect secondary to hypercalcemia and AKI -improving -Corrected calcium level greater than 12, today it is down to 11.4 -Mentation improving will give dose of IV pamidronate today -PTH level is low at 11, given otherwise clinical picture I suspect hypercalcemia of malignancy  Hypercalcemia -As above -We will check SPEP/UPEP, immunofixation -Also check MRI abdomen due to lesions noted on the left kidney on noncontrast CT  Elevated troponin -  -Cardiology consulting, no ST E MI, likely demand ischemia/secondary to accelerated hypertension -2-D echocardiogram with normal EF and wall motion, severe LVH and grade 1 diastolic dysfunction -Stop IV heparin  Hypertensive emergency/dilated cardiomyopathy  -improved -Stop Nitro gtt -Continue Amlodipine 10 mg, Hydralazine, Metoprolol  Chronic diastolic heart failure.  -No evidence for heart failure on exam or CXR.  -monitor  AKI. (Baseline Cr 1.07),  -. AKI in setting of hypertensive  emergency. Should improve with treatment of HTN -Slowly improving -hold ACE, Normal saline 72ml/hr  Severe protein calorie malnutrition -malignancy workup ongoing  Thrombocytopenia., chronic. Stable count at 127.   Enlarging renal lesions -? Concern for malignancy, will get MRI now   COPD.  -CXR negative. Normal sats. -continue home inhalers  Hx of coloectal cancer, s/p LAR / chemoradiation in 2008.  Ethics: -palliative meeting tomorrow  DVT proph: Hep SQ  Code Status: Full code Family Communication: None available Disposition Plan: Will await recommendations from palliative care    Consultants: Palliative care pending  Procedures: 4/3 CT abdomen pelvis without contrast;Increasing size and number of multiple exophytic lesions involving the left kidney. Recommend MRI of the kidney without with contrast for further evaluation. 2. Hyperdense lesion at the upper pole of the left kidney has increased in size, now measuring 1.7 x 2.0 cm. 3. Extensive atherosclerotic change without definite stenosis or aneurysm. 4. Mild degenerative changes in the lower lumbar spine are similar to the prior study. 4/4 echocardiogram; Left ventricle: severe LVH.-LVEF=65%-70%. -(grade 1 diastolic dysfunction).  - Left atrium: severely dilated. - Pericardium, extracardiac: A small pericardial effusion     Cultures 4/3 MRSA by PCR negative  Antibiotics: NA   DVT prophylaxis Subcutaneous heparin    Objective: Filed Vitals:   02/01/16 1000 02/01/16 1100 02/01/16 1125 02/01/16 1200  BP: 107/55 122/60  129/89  Pulse: 58 60  69  Temp:   98.5 F (36.9 C)   TempSrc:   Oral   Resp: 26 31  37  Height:      Weight:      SpO2: 99% 98%  96%    Intake/Output Summary (Last 24 hours) at 02/01/16 1354 Last data filed at 02/01/16 0915  Gross per  24 hour  Intake    535 ml  Output    300 ml  Net    235 ml   Filed Weights   01/30/16 0912 02/01/16 0528  Weight: 36.288 kg (80 lb)  31.4 kg (69 lb 3.6 oz)     Exam: General: AAOx3, No acute respiratory distress, extremely cachectic Lungs: Clear to auscultation bilaterally without wheezes or crackles Cardiovascular: Regular rate and rhythm without murmur gallop or rub normal S1 and S2 Abdomen:negative abdominal pain, negative dysphagia, nondistended, positive soft, bowel sounds, no rebound, no ascites, no appreciable mass Extremities: No significant cyanosis, clubbing, or edema bilateral lower extremities Psychiatric:  Negative depression, negative anxiety, negative fatigue, negative mania  Neurologic:  Cranial nerves II through XII intact, tongue/uvula midline, all extremities muscle strength 5/5, sensation intact throughout, negative dysarthria, negative expressive aphasia, negative receptive aphasia.   Data Reviewed: Basic Metabolic Panel:  Recent Labs Lab 01/30/16 0908 01/30/16 0927 01/31/16 0330 01/31/16 0852  NA 139 139  --  140  K 3.2* 3.2*  --  3.6  CL 99* 96*  --  107  CO2 32  --   --  24  GLUCOSE 96 93  --  93  BUN 23* 26*  --  28*  CREATININE 1.77* 1.90*  --  1.62*  CALCIUM 13.7*  --  11.4* 11.0*   Liver Function Tests:  Recent Labs Lab 01/30/16 0908  AST 38  ALT 14  ALKPHOS 66  BILITOT 0.6  PROT 6.5  ALBUMIN 3.1*   No results for input(s): LIPASE, AMYLASE in the last 168 hours. No results for input(s): AMMONIA in the last 168 hours. CBC:  Recent Labs Lab 01/30/16 0908 01/30/16 0927 01/31/16 0330 02/01/16 0350  WBC 4.3  --  4.9 4.3  HGB 12.3 15.0 9.4* 8.9*  HCT 36.8 44.0 28.0* 27.9*  MCV 92.5  --  92.7 93.3  PLT 127*  --  105* 104*   Cardiac Enzymes:  Recent Labs Lab 01/30/16 1648 01/30/16 2153 01/31/16 0330  TROPONINI 0.09* 0.10* 0.12*   BNP (last 3 results) No results for input(s): BNP in the last 8760 hours.  ProBNP (last 3 results) No results for input(s): PROBNP in the last 8760 hours.  CBG: No results for input(s): GLUCAP in the last 168 hours.  Recent  Results (from the past 240 hour(s))  MRSA PCR Screening     Status: None   Collection Time: 01/30/16  7:00 PM  Result Value Ref Range Status   MRSA by PCR NEGATIVE NEGATIVE Final    Comment:        The GeneXpert MRSA Assay (FDA approved for NASAL specimens only), is one component of a comprehensive MRSA colonization surveillance program. It is not intended to diagnose MRSA infection nor to guide or monitor treatment for MRSA infections.      Studies: Dg Chest Port 1 View  01/31/2016  CLINICAL DATA:  Tachypnea EXAM: PORTABLE CHEST - 1 VIEW COMPARISON:  Two-view chest x-ray 01/30/2016 FINDINGS: The heart size is normal. There is no edema or effusion to suggest failure. Emphysema is again noted. Scarring is noted at the lung apices bilaterally. IMPRESSION: 1. Emphysema. 2. No acute cardiopulmonary disease. Electronically Signed   By: San Morelle M.D.   On: 01/31/2016 07:20    Scheduled Meds: . amLODipine  10 mg Oral Daily  . antiseptic oral rinse  7 mL Mouth Rinse BID  . aspirin  324 mg Oral Once  . feeding supplement (ENSURE ENLIVE)  237 mL Oral BID BM  . heparin subcutaneous  5,000 Units Subcutaneous 3 times per day  . hydrALAZINE  20 mg Oral TID  . isosorbide mononitrate  30 mg Oral Daily  . metoprolol tartrate  75 mg Oral BID  . mometasone-formoterol  2 puff Inhalation BID  . pamidronate (AREDIA) 90 mg IVPB  90 mg Intravenous Once  . tiotropium  18 mcg Inhalation Daily   Continuous Infusions: . sodium chloride 50 mL/hr at 02/01/16 0534    Active Problems:   Chronic diastolic CHF (congestive heart failure) (HCC)   COPD (chronic obstructive pulmonary disease) (HCC)   History of rectal cancer   Malnutrition of moderate degree (HCC)   Hypertensive emergency   AKI (acute kidney injury) ([place])   Weight loss   Multiple renal cysts   Hypercalcemia   Pressure ulcer   Cardiomyopathy ([place])   Chronic diastolic congestive heart failure ([place])    Time spent: 40  minutes    [facility]  [facility] Pager [phone]. If 7PM-7AM, please contact night-coverage at [email or web address], password [facility] 02/01/2016, 1:54 PM  LOS: 2 days

## 2016-02-01 NOTE — Evaluation (Signed)
Physical Therapy Evaluation Patient Details Name: [name] MRN: [id number] DOB: 11-Dec-1940 Today's Date: 02/01/2016   History of Present Illness  Pt is a 75 y/o F admitted after progressively worsening weakness and weight loss.  She presents w/ malnutrition and w/ hypertensive emergency.  Pt has a large pressure injury on her coccyx.  Pt's PMH includes chronic diastolic CHF, COPD, rectal cancer, malnutrition, multiple renal cysts, Lt ORIF Lt LE, Lt THA.      Clinical Impression  Pt admitted with above diagnosis. Pt currently with functional limitations due to the deficits listed below (see PT Problem List). [name] presents w/ generalized weakness, fatiguing quickly, and requires +2 mod assist for bed mobility and sit<>stand transfer. She remained in bed for the past week and has a pressure injury on her coccyx.  Recommending SNF at d/c and daughter reports that the pt and family will discuss d/c planning.  Pt has excellent support at home from son and daughter.  Pt will benefit from skilled PT to increase their independence and safety with mobility to allow discharge to the venue listed below.      Follow Up Recommendations SNF;Supervision/Assistance - 24 hour    Equipment Recommendations  Other (comment) (TBD at next venue of care)    Recommendations for Other Services OT consult     Precautions / Restrictions Precautions Precautions: Fall Precaution Comments: pressure injury on coccyx Restrictions Weight Bearing Restrictions: No      Mobility  Bed Mobility Overal bed mobility: Needs Assistance;+2 for physical assistance Bed Mobility: Supine to Sit;Sit to Supine     Supine to sit: Mod assist;+2 for physical assistance;HOB elevated Sit to supine: Mod assist;+2 for physical assistance   General bed mobility comments: Assist for all aspects of bed mobility.  Pt pulling on therapist and requires assist to elevate trunk.  Bed pad used for positioning.   Transfers Overall  transfer level: Needs assistance Equipment used: 2 person hand held assist Transfers: Sit to/from Stand Sit to Stand: Mod assist;+2 physical assistance;+2 safety/equipment         General transfer comment: Pt requries boost and +2 HHA to stand w/ very flexed posture.  Pt stood at bedside x30 seconds but quickly fatigues and requires assist to control descent to bed.  Ambulation/Gait             General Gait Details: unable to attempt this session  Stairs            Wheelchair Mobility    Modified Rankin (Stroke Patients Only)       Balance Overall balance assessment: Needs assistance Sitting-balance support: Bilateral upper extremity supported;Feet supported Sitting balance-Leahy Scale: Fair Sitting balance - Comments: Pt sat EOB x5 minutes w/ close min guard assist w/ increased effort.   Standing balance support: Bilateral upper extremity supported;During functional activity Standing balance-Leahy Scale: Poor Standing balance comment: Relies heavily on Bil UE support                             Pertinent Vitals/Pain Pain Assessment: Faces Faces Pain Scale: Hurts little more Pain Location: pt does not specify, grimacing Pain Descriptors / Indicators: Grimacing Pain Intervention(s): Limited activity within patient's tolerance;Monitored during session;Repositioned    Home Living Family/patient expects to be discharged to:: Private residence Living Arrangements: Children (son) Available Help at Discharge: Family;Available 24 hours/day (son lives w/ pt, daughter lives next door) Type of Home: House Home Access: Stairs to enter  Entrance Stairs-Rails: None Entrance Stairs-Number of Steps: 2 Home Layout: One level Home Equipment: Walker - 2 wheels;Cane - single point;Bedside commode;Shower seat;Transport chair      Prior Function Level of Independence: Needs assistance   Gait / Transfers Assistance Needed: Has not been ambulatory for 5 months.   Limited to stand pivot w/ assist to [facility] until last week when she became too weak to get OOB.  ADL's / Homemaking Assistance Needed: Needs assist from daughter for dressing and sponge bath.  Pt able to bathe her chest, arms, face, but daughter does the rest.        Hand Dominance        Extremity/Trunk Assessment   Upper Extremity Assessment: Generalized weakness           Lower Extremity Assessment: Generalized weakness (w/ generalized muscle atrophy)      Cervical / Trunk Assessment: Kyphotic  Communication   Communication: HOH  Cognition Arousal/Alertness: Awake/alert Behavior During Therapy: Anxious;WFL for tasks assessed/performed Overall Cognitive Status: Difficult to assess                      General Comments      Exercises        Assessment/Plan    PT Assessment Patient needs continued PT services  PT Diagnosis Difficulty walking;Generalized weakness;Acute pain   PT Problem List Decreased strength;Decreased range of motion;Decreased activity tolerance;Decreased balance;Decreased mobility;Decreased cognition;Decreased knowledge of use of DME;Decreased safety awareness;Pain;Decreased skin integrity  PT Treatment Interventions DME instruction;Gait training;Functional mobility training;Therapeutic activities;Stair training;Therapeutic exercise;Balance training;Cognitive remediation;Patient/family education;Wheelchair mobility training   PT Goals (Current goals can be found in the Care Plan section) Acute Rehab PT Goals Patient Stated Goal: none stated PT Goal Formulation: With patient/family Time For Goal Achievement: 02/22/16 Potential to Achieve Goals: Fair    Frequency Min 3X/week   Barriers to discharge Inaccessible home environment steps to enter home    Co-evaluation               End of Session Equipment Utilized During Treatment: Gait belt;Oxygen Activity Tolerance: Patient limited by fatigue Patient left: in bed;with call  bell/phone within reach;with bed alarm set;with family/visitor present Nurse Communication: Mobility status         Time: [id number] PT Time Calculation (min) (ACUTE ONLY): 26 min   Charges:   PT Evaluation $PT Eval High Complexity: 1 Procedure PT Treatments $Therapeutic Activity: 8-22 mins   PT G Codes:       Collie Siad PT, DPT  Pager: [phone] Phone: [phone] 02/01/2016, 1:22 PM

## 2016-02-01 NOTE — Progress Notes (Signed)
No charge note.   PMT meeting scheduled with [name] 4/6 at 9:00.  Imogene Burn, [place] [facility] Pager: [phone]

## 2016-02-02 ENCOUNTER — Inpatient Hospital Stay (HOSPITAL_COMMUNITY): Payer: Medicare Other

## 2016-02-02 DIAGNOSIS — Z66 Do not resuscitate: Secondary | ICD-10-CM | POA: Insufficient documentation

## 2016-02-02 DIAGNOSIS — Z515 Encounter for palliative care: Secondary | ICD-10-CM | POA: Insufficient documentation

## 2016-02-02 DIAGNOSIS — Z7189 Other specified counseling: Secondary | ICD-10-CM | POA: Insufficient documentation

## 2016-02-02 LAB — COMPREHENSIVE METABOLIC PANEL
ALT: 13 U/L — AB (ref 14–54)
AST: 32 U/L (ref 15–41)
Albumin: 2.3 g/dL — ABNORMAL LOW (ref 3.5–5.0)
Alkaline Phosphatase: 54 U/L (ref 38–126)
Anion gap: 10 (ref 5–15)
BILIRUBIN TOTAL: 0.5 mg/dL (ref 0.3–1.2)
BUN: 31 mg/dL — ABNORMAL HIGH (ref 6–20)
CHLORIDE: 109 mmol/L (ref 101–111)
CO2: 23 mmol/L (ref 22–32)
CREATININE: 1.25 mg/dL — AB (ref 0.44–1.00)
Calcium: 9.5 mg/dL (ref 8.9–10.3)
GFR, EST AFRICAN AMERICAN: 48 mL/min — AB (ref >60)
GFR, EST NON AFRICAN AMERICAN: 41 mL/min — AB (ref >60)
Glucose, Bld: 88 mg/dL (ref 65–99)
Potassium: 3.2 mmol/L — ABNORMAL LOW (ref 3.5–5.1)
Sodium: 142 mmol/L (ref 135–145)
TOTAL PROTEIN: 5 g/dL — AB (ref 6.5–8.1)

## 2016-02-02 LAB — PROTEIN ELECTROPHORESIS, SERUM
A/G Ratio: 0.9 (ref 0.7–1.7)
A/G Ratio: 0.9 (ref 0.7–1.7)
ALPHA-1-GLOBULIN: 0.2 g/dL (ref 0.0–0.4)
ALPHA-1-GLOBULIN: 0.3 g/dL (ref 0.0–0.4)
ALPHA-2-GLOBULIN: 0.6 g/dL (ref 0.4–1.0)
ALPHA-2-GLOBULIN: 0.7 g/dL (ref 0.4–1.0)
Albumin ELP: 2.5 g/dL — ABNORMAL LOW (ref 2.9–4.4)
Albumin ELP: 2.8 g/dL — ABNORMAL LOW (ref 2.9–4.4)
BETA GLOBULIN: 0.9 g/dL (ref 0.7–1.3)
Beta Globulin: 0.7 g/dL (ref 0.7–1.3)
GAMMA GLOBULIN: 1.2 g/dL (ref 0.4–1.8)
GAMMA GLOBULIN: 1.3 g/dL (ref 0.4–1.8)
Globulin, Total: 2.8 g/dL (ref 2.2–3.9)
Globulin, Total: 3.1 g/dL (ref 2.2–3.9)
Total Protein ELP: 5.3 g/dL — ABNORMAL LOW (ref 6.0–8.5)
Total Protein ELP: 5.9 g/dL — ABNORMAL LOW (ref 6.0–8.5)

## 2016-02-02 LAB — CBC
HCT: 27.7 % — ABNORMAL LOW (ref 36.0–46.0)
Hemoglobin: 8.8 g/dL — ABNORMAL LOW (ref 12.0–15.0)
MCH: 29.8 pg (ref 26.0–34.0)
MCHC: 31.8 g/dL (ref 30.0–36.0)
MCV: 93.9 fL (ref 78.0–100.0)
PLATELETS: 104 10*3/uL — AB (ref 150–400)
RBC: 2.95 MIL/uL — ABNORMAL LOW (ref 3.87–5.11)
RDW: 15 % (ref 11.5–15.5)
WBC: 4.6 10*3/uL (ref 4.0–10.5)

## 2016-02-02 MED ORDER — GADOBENATE DIMEGLUMINE 529 MG/ML IV SOLN
4.0000 mL | Freq: Once | INTRAVENOUS | Status: AC | PRN
  Administered 2016-02-02: 4 mL via INTRAVENOUS

## 2016-02-02 MED ORDER — FUROSEMIDE 10 MG/ML IJ SOLN
20.0000 mg | Freq: Once | INTRAMUSCULAR | Status: AC
  Administered 2016-02-02: 20 mg via INTRAVENOUS
  Filled 2016-02-02: qty 2

## 2016-02-02 MED ORDER — POTASSIUM CHLORIDE CRYS ER 20 MEQ PO TBCR
40.0000 meq | EXTENDED_RELEASE_TABLET | ORAL | Status: AC
  Administered 2016-02-02 (×2): 40 meq via ORAL
  Filled 2016-02-02 (×2): qty 2

## 2016-02-02 NOTE — Clinical Documentation Improvement (Addendum)
Internal Medicine  Can the diagnosis of "Acute on Chronic Renal Failure" be further specified?  Thank you   CKD Stage I - GFR greater than or equal to 90  CKD Stage II - GFR 60-89  CKD Stage III - GFR 30-59  CKD Stage IV - GFR 15-29  CKD Stage V - GFR < 15  Other condition  Unable to clinically determine   Supporting Information:  Hypertension, Cardiomyopathy, Hypertensive Emergency   Eval:  CMET   GFR 41/30    Please exercise your independent, professional judgment when responding. A specific answer is not anticipated or expected.   Thank You, [place] [phone]

## 2016-02-02 NOTE — Progress Notes (Signed)
The patient stable from cardiac standpoint, we will sign off, call us with any questions. For full report please see my yesterday's note.  Dorothy Spark 02/02/2016

## 2016-02-02 NOTE — Care Management Note (Signed)
Case Management Note  Patient Details  Name: [name] MRN: [id number] Date of Birth: October 08, 1941  Subjective/Objective:   Patient presents with hypertensive emergency, hypercalcemia, altered mental status and acute renal failure. A CT abdomen pelvis shows lesions on her kidney and her echo cardiogram shows severe LVH with mitral stenosis. She is not normally on oxygen at home but has required it in the hospital.  Daughter, [name] is her care giver and would like to take patient home, will await MIR results ? Malignancy or not. Plan is home with home health vs home with hospice.  NCM will cont to follow for dc needs. ( May need home oxygen as well).                 Action/Plan:   Expected Discharge Date:                  Expected Discharge Plan:  [place]  In-House Referral:     Discharge planning Services  CM Consult  Post Acute Care Choice:    Choice offered to:     DME Arranged:    DME Agency:     HH Arranged:    [place] Agency:     Status of Service:  In process, will continue to follow  Medicare Important Message Given:  Yes Date Medicare IM Given:    Medicare IM give by:    Date Additional Medicare IM Given:    Additional Medicare Important Message give by:     If discussed at [place] of Stay Meetings, dates discussed:    Additional Comments:  Jones, [place], RN 02/02/2016, 2:56 PM

## 2016-02-02 NOTE — Care Management Important Message (Signed)
Important Message  Patient Details  Name: [name] MRN: [id number] Date of Birth: 1941-08-28   Medicare Important Message Given:  Yes    Saharsh Sterling [place] 02/02/2016, 11:01 AM

## 2016-02-02 NOTE — Consult Note (Signed)
Consultation Note Date: 02/02/2016   Patient Name: [name]  DOB: 1941/02/16  MRN: [id number]  Age / Sex: 75 y.o., female  PCP: Shirline Frees, MD Referring Physician: Domenic Polite, MD  Reason for Consultation: Establishing goals of care  75 yo petite female with a history of rectal carcinoma and COPD.   Ms. Whetsel was admitted on 4/3.  Her daughter brought her to the hospital because she became lethargic and confused. On admission she was found to have hypertensive emergency, hypercalcemia, altered mental status and acute renal failure.  A CT abdomen pelvis shows lesions on her kidney and her echo cardiogram shows severe LVH with mitral stenosis.  She is not normally on oxygen at home but has required it in the hospital.  Chest xray from 4/5 shows possible CHF.  Clinical Assessment/Narrative: I met with the patient and her daughter at bedside.  Ms. Guardiola lives at home with her son.  Her daughter, [name], lives next door and is her primary care taker.  Another son lives across the street.  Ms. Fleig has taken a significant decline in the last two months.  She has rarely been out of bed.  Her daughter assists with all ADLs.  She has lost a significant amount of weight.  Despite Tammy attempting to "force" her mother to eat, the patient states she is simply not hungry. She has lost approximately 20 lbs in the last month and currently weighs 68 lbs.  From the RN, I understand that the patient has a serious sacral ulcer and there is concern she will soon have exposed bone.  Tammy and I discussed that the hypercalcemia caused the confusion, and the it was likely caused by a malignancy.  The patient is to go for an MRI today to further evaluate for malignancy.  We discussed medical approaches that involve aggressive care vs palliative care and we discussed Code Status.  Tammy felt her mother should be a DNR rather than a full  code.  Tammy does want more information about the source of her mother's illness but she is more inclined to have her mother come home rather than go to SNF.  If there is something treatable and a good reason to send her mother to SNF, then she will consider it.  Otherwise she want her mother at home where she will be able to care for her.  Tammy understands that her mother's weight loss and poor nutritional status make her a poor candidate for interventions or therapy.   We discussed home health services vs hospice services.    I plan to meet with Tammy again after we have more results/information.  She is very cooperative and supportive of her mother's care.  The patient appears to have good family support at home.  Contacts/Participants in Discussion: Daughter, [name] and the patient. Primary Decision Maker: Tammy   SUMMARY OF RECOMMENDATIONS  DNR / DNI  Full scope treatment for now.  Will consider SNF is there is a benefit, but more inclined to take the patient home with home services (hospice vs home health)  Code Status/Advance Care Planning: DNR   Other Directives:None  Symptom Management:   Per primary team.  Palliative Prophylaxis:   Aspiration, Bowel Regimen, Delirium Protocol, Frequent Pain Assessment and Turn Reposition  Additional Recommendations (Limitations, Scope, Preferences):  Full Scope Treatment   Psycho-social/Spiritual:  Support System: Strong Desire for further Chaplaincy support: Yes Additional Recommendations: Caregiving  Support/Resources  Prognosis:  less than 6 months based on functional  status and weight loss.  If a malignancy is diagnosed that will further define her prognosis  Discharge Planning: Probably to home.  Will be able to determine over the next 24-48 hours.   Chief Complaint/ Primary Diagnoses: Present on Admission:  . Hypertensive emergency . COPD (chronic obstructive pulmonary disease) ([place]) . Chronic diastolic CHF (congestive  heart failure) ([place]) . AKI (acute kidney injury) ([place]) . Malnutrition of moderate degree ([place]) . History of rectal cancer . Weight loss . Hypercalcemia . Pressure ulcer . Cardiomyopathy ([place]) . Chronic diastolic congestive heart failure ([place])  I have reviewed the medical record, interviewed the patient and family, and examined the patient. The following aspects are pertinent.  Past Medical History  Diagnosis Date  . COPD (chronic obstructive pulmonary disease) ([place])   . Rectal adenocarcinoma ([place])   . Lower GI bleed   . Hypertension    Social History   Social History  . Marital Status: Widowed    Spouse Name: N/A  . Number of Children: N/A  . Years of Education: N/A   Social History Main Topics  . Smoking status: Former Smoker    Types: Cigarettes    Quit date: 03/27/2010  . Smokeless tobacco: None  . Alcohol Use: No  . Drug Use: No  . Sexual Activity: Not Asked   Other Topics Concern  . None   Social History Narrative   No family history on file. Scheduled Meds: . amLODipine  10 mg Oral Daily  . antiseptic oral rinse  7 mL Mouth Rinse BID  . aspirin  324 mg Oral Once  . feeding supplement (ENSURE ENLIVE)  237 mL Oral BID BM  . heparin subcutaneous  5,000 Units Subcutaneous 3 times per day  . hydrALAZINE  20 mg Oral TID  . isosorbide mononitrate  30 mg Oral Daily  . metoprolol tartrate  75 mg Oral BID  . mometasone-formoterol  2 puff Inhalation BID  . tiotropium  18 mcg Inhalation Daily   Continuous Infusions: . sodium chloride 50 mL/hr at 02/01/16 0534   PRN Meds:.acetaminophen, albuterol Medications Prior to Admission:  Prior to Admission medications   Medication Sig Start Date End Date Taking? Authorizing Provider  acetaminophen (TYLENOL) 650 MG CR tablet Take 650 mg by mouth 2 (two) times daily as needed. For pain   Yes Historical Provider, MD  budesonide-formoterol (SYMBICORT) 160-4.5 MCG/ACT inhaler Inhale 2 puffs into the lungs 2 (two) times daily.    Yes Historical Provider, MD  Calcium Carb-Cholecalciferol 600-800 MG-UNIT TABS Take 1 tablet by mouth daily.   Yes Historical Provider, MD  feeding supplement (ENSURE COMPLETE) LIQD Take 237 mLs by mouth 2 (two) times daily between meals. 06/02/12  Yes Barton Dubois, MD  hydrALAZINE (APRESOLINE) 10 MG tablet Take 10 mg by mouth 3 (three) times daily.   Yes Historical Provider, MD  lisinopril (PRINIVIL,ZESTRIL) 10 MG tablet Take 10 mg by mouth daily. 12/09/13  Yes Historical Provider, MD  metoprolol tartrate (LOPRESSOR) 25 MG tablet Take 25 mg by mouth 2 (two) times daily.   Yes Historical Provider, MD  Multiple Vitamin (MULTIVITAMIN WITH MINERALS) TABS Take 1 tablet by mouth daily.   Yes Historical Provider, MD  PROAIR HFA 108 (90 BASE) MCG/ACT inhaler Inhale 2 puffs into the lungs every 4 (four) hours as needed for wheezing or shortness of breath.  08/09/14  Yes Historical Provider, MD  tiotropium (SPIRIVA) 18 MCG inhalation capsule Place 18 mcg into inhaler and inhale daily.   Yes Historical Provider, MD  calcium-vitamin D (OSCAL WITH D) 500-200 MG-UNIT per tablet Take 1 tablet by mouth 2 (two) times daily. 06/02/12 10/03/14  Barton Dubois, MD  ferrous sulfate 325 (65 FE) MG tablet Take 1 tablet (325 mg total) by mouth 3 (three) times daily after meals. 06/02/12 10/03/14  Barton Dubois, MD   Allergies  Allergen Reactions  . Morphine And Related Other (See Comments)    "makes me crazy"  . Ciprofloxacin Hives and Rash    Review of Systems:  No constipation, dysuria, dysphagia.  Complains of SOB  Physical Exam  Elderly, cachectic, frail female, hard of hearing but A&O CV RRR Resp:  Shallow, mildly increased work of breathing on nasal canula Abdomen:  Nt, Nd Extremities:  Able to move all 4, no edema  Vital Signs: BP 128/63 mmHg  Pulse 80  Temp(Src) 98.3 F (36.8 C) (Oral)  Resp 25  Ht 5' (1.524 m)  Wt 31.2 kg (68 lb 12.5 oz)  BMI 13.43 kg/m2  SpO2 98%  SpO2: SpO2: 98 % O2 Device:SpO2:  98 % O2 Flow Rate: .O2 Flow Rate (L/min): 2 L/min  IO: Intake/output summary:  Intake/Output Summary (Last 24 hours) at 02/02/16 1015 Last data filed at 02/02/16 0141  Gross per 24 hour  Intake    480 ml  Output      0 ml  Net    480 ml    LBM: Last BM Date: 01/31/16 Baseline Weight: Weight: 36.288 kg (80 lb) Most recent weight: Weight: 31.2 kg (68 lb 12.5 oz)      Palliative Assessment/Data:  Flowsheet Rows        Most Recent Value   Intake Tab    Referral Department  Hospitalist   Unit at Time of Referral  Intermediate Care Unit   Palliative Care Primary Diagnosis  Cardiac   Date Notified  01/31/16   Palliative Care Type  New Palliative care   Reason for referral  Clarify Goals of Care   Date of Admission  01/30/16   Date first seen by Palliative Care  02/01/16   # of days Palliative referral response time  1 Day(s)   # of days IP prior to Palliative referral  1   Clinical Assessment    Psychosocial & Spiritual Assessment    Palliative Care Outcomes       Additional Data Reviewed:  CBC:    Component Value Date/Time   WBC 4.6 02/02/2016 0409   WBC 3.5* 01/29/2008 1116   HGB 8.8* 02/02/2016 0409   HGB 13.9 01/29/2008 1116   HCT 27.7* 02/02/2016 0409   HCT 40.0 01/29/2008 1116   PLT 104* 02/02/2016 0409   PLT 134* 01/29/2008 1116   MCV 93.9 02/02/2016 0409   MCV 91.6 01/29/2008 1116   NEUTROABS 8.4* 12/15/2013 1240   NEUTROABS 2.2 01/29/2008 1116   LYMPHSABS 0.5* 12/15/2013 1240   LYMPHSABS 0.7* 01/29/2008 1116   MONOABS 0.6 12/15/2013 1240   MONOABS 0.4 01/29/2008 1116   EOSABS 0.0 12/15/2013 1240   EOSABS 0.1 01/29/2008 1116   BASOSABS 0.0 12/15/2013 1240   BASOSABS 0.0 01/29/2008 1116   Comprehensive Metabolic Panel:    Component Value Date/Time   NA 142 02/02/2016 0409   K 3.2* 02/02/2016 0409   CL 109 02/02/2016 0409   CO2 23 02/02/2016 0409   BUN 31* 02/02/2016 0409   CREATININE 1.25* 02/02/2016 0409   GLUCOSE 88 02/02/2016 0409   CALCIUM  9.5 02/02/2016 0409   AST 32 02/02/2016 0409  ALT 13* 02/02/2016 0409   ALKPHOS 54 02/02/2016 0409   BILITOT 0.5 02/02/2016 0409   PROT 5.0* 02/02/2016 0409   ALBUMIN 2.3* 02/02/2016 0409     Time In: 8:45 Time Out: 10:00 Time Total: 70 min Greater than 50%  of this time was spent counseling and coordinating care related to the above assessment and plan.  Signed by:  Melton Alar, PA-C  02/02/2016, 10:15 AM  Please contact Palliative Medicine Team phone at [phone] for questions and concerns.

## 2016-02-02 NOTE — Progress Notes (Signed)
[facility] PROGRESS NOTE  JADAN ANTIS [id number] DOB: [birth year] DOA: 01/30/2016 PCP: Shirline Frees, MD Admit HPI / Brief Narrative: [name] is a 75 y.o. female PMHx  COPD, HTN, HLD, Colorectal Adenocarcinoma.  Patient brought to the ED this morning for weakness and confusion. Per family she's had excessive weight loss. EKG noted by EMS, code STEMI called. EDP spoke with cardiology who will see the patient. Patient with significantly elevated blood pressure on arrival. Initial troponin 0.09. Patient started on heparin drip and nitroglycerin drip, BP improved from initial reading of 215/ 99 to 125/70.  Patient denies chest pain. She mainly complains of excessive sleepiness. She uses inhalers at home, denies cough or shortness of breath. Her appetite is poor and patient reported excessive weight loss over last few months.   HPI/Subjective: Confusion better per daughter, intermittent dyspnea  Assessment/Plan:  Metabolic encephalopathy -Suspect secondary to hypercalcemia and AKI -improving -Corrected calcium level was greater than 12, down to 9.5 today -given a dose of IV pamidronate 4/5 -PTH level is low at 11, given otherwise clinical picture I suspect hypercalcemia of malignancy -PTHrp and 1.25Vitamin D level pending  Hypercalcemia -As above - SPEP/UPEP, immunofixation still pending -MRI abdomen due to lesions noted on the left kidney on noncontrast CT pending, hopefully will be done today  Elevated troponin -  -Cardiology consulting, no STEMI, likely demand ischemia/secondary to accelerated hypertension -2-D echocardiogram with normal EF and wall motion, severe LVH and grade 1 diastolic dysfunction -no further cardiac workup planned  Hypertensive emergency/dilated cardiomyopathy  -improved -Stop Nitro gtt -Continue Amlodipine 10 mg, Hydralazine, Metoprolol  Acute on Chronic diastolic heart failure.  -stop Ivf, lasix x1 today  AKI. (Baseline Cr 1.07),   -. AKI in setting of hypertensive emergency. Should improve with treatment of HTN -improving -hold ACE, stop IVF, lasix today  Severe protein calorie malnutrition -malignancy workup ongoing -weight down to 68lbs  Thrombocytopenia., chronic. Stable count at 127.   Enlarging renal lesions -? Concern for malignancy, FU MRI  COPD.  -CXR negative. Normal sats. -continue home inhalers  Hx of coloectal cancer, s/p LAR / chemoradiation in 2008.  Ethics: -palliative meeting today  DVT proph: Hep SQ  Code Status: Full code Family Communication: daughter at bedside Disposition Plan: Will likely need SNF vs Hospice    Consultants: Palliative care pending  Procedures: 4/3 CT abdomen pelvis without contrast;Increasing size and number of multiple exophytic lesions involving the left kidney. Recommend MRI of the kidney without with contrast for further evaluation. 2. Hyperdense lesion at the upper pole of the left kidney has increased in size, now measuring 1.7 x 2.0 cm. 3. Extensive atherosclerotic change without definite stenosis or aneurysm. 4. Mild degenerative changes in the lower lumbar spine are similar to the prior study. 4/4 echocardiogram; Left ventricle: severe LVH.-LVEF=65%-70%. -(grade 1 diastolic dysfunction).  - Left atrium: severely dilated. - Pericardium, extracardiac: A small pericardial effusion     Cultures 4/3 MRSA by PCR negative  Antibiotics: NA   DVT prophylaxis Subcutaneous heparin    Objective: Filed Vitals:   02/02/16 0700 02/02/16 0739 02/02/16 0756 02/02/16 0932  BP: 128/63     Pulse: 60   80  Temp:  98.3 F (36.8 C)    TempSrc:  Oral    Resp:      Height:      Weight:      SpO2: 100%  98%     Intake/Output Summary (Last 24 hours) at 02/02/16 1119 Last data filed at  02/02/16 [id number]  Gross per 24 hour  Intake    480 ml  Output      0 ml  Net    480 ml   Filed Weights   01/30/16 0912 02/01/16 0528 02/02/16 0400  Weight:  36.288 kg (80 lb) 31.4 kg (69 lb 3.6 oz) 31.2 kg (68 lb 12.5 oz)     Exam: General: AAOx3, No acute respiratory distress, extremely cachectic Lungs: Clear to auscultation bilaterally without wheezes or crackles Cardiovascular: Regular rate and rhythm without murmur gallop or rub normal S1 and S2 Abdomen:negative abdominal pain, negative dysphagia, nondistended, positive soft, bowel sounds, no rebound, no ascites, no appreciable mass Extremities: No significant cyanosis, clubbing, or edema bilateral lower extremities Psychiatric:  Negative depression, negative anxiety, negative fatigue, negative mania  Neurologic:  Cranial nerves II through XII intact, tongue/uvula midline, all extremities muscle strength 5/5, sensation intact throughout, negative dysarthria, negative expressive aphasia, negative receptive aphasia.   Data Reviewed: Basic Metabolic Panel:  Recent Labs Lab 01/30/16 0908 01/30/16 0927 01/31/16 0330 01/31/16 0852 02/02/16 0409  NA 139 139  --  140 142  K 3.2* 3.2*  --  3.6 3.2*  CL 99* 96*  --  107 109  CO2 32  --   --  24 23  GLUCOSE 96 93  --  93 88  BUN 23* 26*  --  28* 31*  CREATININE 1.77* 1.90*  --  1.62* 1.25*  CALCIUM 13.7*  --  11.4* 11.0* 9.5   Liver Function Tests:  Recent Labs Lab 01/30/16 0908 02/02/16 0409  AST 38 32  ALT 14 13*  ALKPHOS 66 54  BILITOT 0.6 0.5  PROT 6.5 5.0*  ALBUMIN 3.1* 2.3*   No results for input(s): LIPASE, AMYLASE in the last 168 hours. No results for input(s): AMMONIA in the last 168 hours. CBC:  Recent Labs Lab 01/30/16 0908 01/30/16 0927 01/31/16 0330 02/01/16 0350 02/02/16 0409  WBC 4.3  --  4.9 4.3 4.6  HGB 12.3 15.0 9.4* 8.9* 8.8*  HCT 36.8 44.0 28.0* 27.9* 27.7*  MCV 92.5  --  92.7 93.3 93.9  PLT 127*  --  105* 104* 104*   Cardiac Enzymes:  Recent Labs Lab 01/30/16 1648 01/30/16 2153 01/31/16 0330  TROPONINI 0.09* 0.10* 0.12*   BNP (last 3 results) No results for input(s): BNP in the last  8760 hours.  ProBNP (last 3 results) No results for input(s): PROBNP in the last 8760 hours.  CBG: No results for input(s): GLUCAP in the last 168 hours.  Recent Results (from the past 240 hour(s))  MRSA PCR Screening     Status: None   Collection Time: 01/30/16  7:00 PM  Result Value Ref Range Status   MRSA by PCR NEGATIVE NEGATIVE Final    Comment:        The GeneXpert MRSA Assay (FDA approved for NASAL specimens only), is one component of a comprehensive MRSA colonization surveillance program. It is not intended to diagnose MRSA infection nor to guide or monitor treatment for MRSA infections.      Studies: Dg Chest Port 1 View  02/01/2016  CLINICAL DATA:  Dyspnea EXAM: PORTABLE CHEST 1 VIEW COMPARISON:  01/31/2016 FINDINGS: There is marked hyperinflation. There are new basilar ground-glass opacities. There is new interstitial fluid or thickening. The rapid worsening of the interstitial prominence suggests fluid. No large effusions. Upper normal heart size, unchanged. Unremarkable hilar and mediastinal contours, unchanged. IMPRESSION: New interstitial thickening and mild basilar ground-glass opacities. The  findings are most likely due to congestive heart failure superimposed on COPD. Electronically Signed   By: Andreas Newport M.D.   On: 02/01/2016 19:13    Scheduled Meds: . amLODipine  10 mg Oral Daily  . antiseptic oral rinse  7 mL Mouth Rinse BID  . aspirin  324 mg Oral Once  . feeding supplement (ENSURE ENLIVE)  237 mL Oral BID BM  . furosemide  20 mg Intravenous Once  . heparin subcutaneous  5,000 Units Subcutaneous 3 times per day  . hydrALAZINE  20 mg Oral TID  . isosorbide mononitrate  30 mg Oral Daily  . metoprolol tartrate  75 mg Oral BID  . mometasone-formoterol  2 puff Inhalation BID  . tiotropium  18 mcg Inhalation Daily   Continuous Infusions:    Active Problems:   Chronic diastolic CHF (congestive heart failure) ([facility])   COPD (chronic obstructive  pulmonary disease) ([facility])   History of rectal cancer   Malnutrition of moderate degree ([facility])   Hypertensive emergency   AKI (acute kidney injury) ([place])   Weight loss   Multiple renal cysts   Hypercalcemia   Pressure ulcer   Cardiomyopathy ([place])   Chronic diastolic congestive heart failure ([place])   Goals of care, counseling/discussion   Encounter for hospice care discussion   DNR (do not resuscitate)   Palliative care encounter    Time spent: 14 minutes    [facility] Pager [phone]. If 7PM-7AM, please contact night-coverage at [email or web address], password [facility] 02/02/2016, 11:19 AM  LOS: 3 days

## 2016-02-03 DIAGNOSIS — E43 Unspecified severe protein-calorie malnutrition: Secondary | ICD-10-CM

## 2016-02-03 DIAGNOSIS — S31000A Unspecified open wound of lower back and pelvis without penetration into retroperitoneum, initial encounter: Secondary | ICD-10-CM

## 2016-02-03 LAB — CBC
HEMATOCRIT: 30.4 % — AB (ref 36.0–46.0)
Hemoglobin: 9.5 g/dL — ABNORMAL LOW (ref 12.0–15.0)
MCH: 29.6 pg (ref 26.0–34.0)
MCHC: 31.3 g/dL (ref 30.0–36.0)
MCV: 94.7 fL (ref 78.0–100.0)
Platelets: 135 10*3/uL — ABNORMAL LOW (ref 150–400)
RBC: 3.21 MIL/uL — AB (ref 3.87–5.11)
RDW: 15.2 % (ref 11.5–15.5)
WBC: 4.4 10*3/uL (ref 4.0–10.5)

## 2016-02-03 LAB — IMMUNOFIXATION ELECTROPHORESIS
IgA: 223 mg/dL (ref 64–422)
IgG (Immunoglobin G), Serum: 1094 mg/dL (ref 700–1600)
IgM, Serum: 211 mg/dL (ref 26–217)
TOTAL PROTEIN ELP: 5.8 g/dL — AB (ref 6.0–8.5)

## 2016-02-03 LAB — CALCIUM, IONIZED: CALCIUM, IONIZED, SERUM: 5.9 mg/dL — AB (ref 4.5–5.6)

## 2016-02-03 LAB — TSH: TSH: 1.595 u[IU]/mL (ref 0.350–4.500)

## 2016-02-03 LAB — CALCITRIOL (1,25 DI-OH VIT D): VIT D 1 25 DIHYDROXY: 15.7 pg/mL — AB (ref 19.9–79.3)

## 2016-02-03 NOTE — Progress Notes (Signed)
Physical Therapy Treatment & Discharge Patient Details Name: [name] MRN: [id number] DOB: 09-04-1941 Today's Date: 02/03/2016    History of Present Illness Pt is a 75 y/o F admitted after progressively worsening weakness and weight loss.  She presents w/ malnutrition and w/ hypertensive emergency.  Pt has a large pressure injury on her coccyx.  Pt's PMH includes chronic diastolic CHF, COPD, rectal cancer, malnutrition, multiple renal cysts, Lt ORIF Lt LE, Lt THA.      PT Comments    Pt did well with therapy today and seems to be at baseline level of functioning. Spoke with pt's daughter on phone and she wishes to take the pt home with hospice care and feels like she has all the equipment she needs to do so, she will just need a hospital bed. The pt was able to transfer to the chair of Mod A of one person and the daughter states she feels she could handle that at home because she has been doing it for some time. Pt has met all acute PT goals and there are no other PT needs. Acute PT signing off at this time.    Follow Up Recommendations  Other (comment) (Daughter requested home with hospice)     Equipment Recommendations  Hospital bed    Recommendations for Other Services       Precautions / Restrictions Precautions Precautions: Fall Precaution Comments: pressure injury on coccyx Restrictions Weight Bearing Restrictions: No    Mobility  Bed Mobility Overal bed mobility: Needs Assistance Bed Mobility: Supine to Sit     Supine to sit: Min assist     General bed mobility comments: Min A to bring trunk upright.   Transfers Overall transfer level: Needs assistance Equipment used: 2 person hand held assist Transfers: Sit to/from [facility] Sit to Stand: Mod assist Stand pivot transfers: Mod assist       General transfer comment: Mod A to stand and to transfer to chair. Pt states this is her baseline when at home.   Ambulation/Gait              General Gait Details: pt does not ambulate at baseline.    Stairs            Wheelchair Mobility    Modified Rankin (Stroke Patients Only)       Balance Overall balance assessment: Needs assistance Sitting-balance support: No upper extremity supported;Feet supported Sitting balance-Leahy Scale: Good Sitting balance - Comments: Pt able to sit EOB with her hands in her lap.    Standing balance support: Bilateral upper extremity supported Standing balance-Leahy Scale: Poor Standing balance comment: Reliant on UE support.                     Cognition Arousal/Alertness: Awake/alert Behavior During Therapy: WFL for tasks assessed/performed Overall Cognitive Status: Difficult to assess                      Exercises      General Comments General comments (skin integrity, edema, etc.): Pt very pleasant and cooperative with therapy.       Pertinent Vitals/Pain Pain Assessment: No/denies pain  SpO2 97% on 3L [place].     Home Living                      Prior Function            PT Goals (current goals can now be  found in the care plan section) Acute Rehab PT Goals Patient Stated Goal: none stated PT Goal Formulation: With patient/family Time For Goal Achievement: 02/22/16 Potential to Achieve Goals: Good Progress towards PT goals: Goals met/education completed, patient discharged from PT    Frequency       PT Plan Discharge plan needs to be updated    Co-evaluation             End of Session Equipment Utilized During Treatment: Gait belt;Oxygen Activity Tolerance: Patient tolerated treatment well Patient left: in chair;with call bell/phone within reach;with chair alarm set     Time: 1440-1507 PT Time Calculation (min) (ACUTE ONLY): 27 min  Charges:  $Therapeutic Activity: 8-22 mins $Self Care/Home Management: 8-22                    G Codes:      [place], SPT [place] 02/03/2016, 3:46 PM

## 2016-02-03 NOTE — Progress Notes (Signed)
[facility] PROGRESS NOTE  MAKAYELA HARNER [id number] DOB: 1941-01-15 DOA: 01/30/2016 PCP: Shirline Frees, MD Admit HPI / Brief Narrative: [name] is a 75 y.o. female PMHx  COPD, HTN, HLD, Colorectal Adenocarcinoma.  Patient brought to the ED this morning for weakness and confusion. Per family she's had excessive weight loss. EKG noted by EMS, code STEMI called. EDP spoke with cardiology who will see the patient. Patient with significantly elevated blood pressure on arrival. Initial troponin 0.09. Patient started on heparin drip and nitroglycerin drip, BP improved from initial reading of 215/ 99 to 125/70.  Patient denies chest pain. She mainly complains of excessive sleepiness. She uses inhalers at home, denies cough or shortness of breath. Her appetite is poor and patient reported excessive weight loss over last few months.   HPI/Subjective: Confusion better per daughter, feels ok, PO intake up and dpwn  Assessment/Plan:  Metabolic encephalopathy -Suspect secondary to hypercalcemia and AKI -improving -Corrected calcium level was greater than 12, down to 9.5 today -given a dose of IV pamidronate 4/5  Hypercalcemia -improved -As above -PTH low, PTHrp and 1,25Vitamin D pending -SPEP/UPEP, immunofixation not suggestive of Myeloma -MRI abdomen does not show concrete renal masses  Elevated troponin -  -Cardiology consulting, no STEMI, likely demand ischemia/secondary to accelerated hypertension -2-D echocardiogram with normal EF and wall motion, severe LVH and grade 1 diastolic dysfunction -no further cardiac workup planned  Hypertensive emergency/dilated cardiomyopathy  -improved -Stop Nitro gtt -Continue Amlodipine 10 mg, Hydralazine, Metoprolol  Acute on Chronic diastolic heart failure.  - lasix x1 4/6 -appears euvolemic today, monitor  AKI. (Baseline Cr 1.07),  -. AKI in setting of hypertensive emergency. Should improve with treatment of  HTN -improving -held ACE,  lasix 4/6 -no labs today, monitor  Severe protein calorie malnutrition -malignancy workup negative thus far -weight down to 68lbs  Thrombocytopenia., chronic. Stable count at 127.   Enlarging renal lesions -? Concern for malignancy, FU MRI  COPD.  -CXR negative. Normal sats. -continue home inhalers  Hx of coloectal cancer, s/p LAR / chemoradiation in 2008.  Ethics: -palliative following, plan for Hospice at discharge  DVT proph: Hep SQ  Code Status:DNR Family Communication: daughter at bedside Disposition Plan: likley home with Hospice soon   Consultants: Palliative care pending  Procedures: 4/3 CT abdomen pelvis without contrast;Increasing size and number of multiple exophytic lesions involving the left kidney. Recommend MRI of the kidney without with contrast for further evaluation. 2. Hyperdense lesion at the upper pole of the left kidney has increased in size, now measuring 1.7 x 2.0 cm. 3. Extensive atherosclerotic change without definite stenosis or aneurysm. 4. Mild degenerative changes in the lower lumbar spine are similar to the prior study. 4/4 echocardiogram; Left ventricle: severe LVH.-LVEF=65%-70%. -(grade 1 diastolic dysfunction).  - Left atrium: severely dilated. - Pericardium, extracardiac: A small pericardial effusion     Cultures 4/3 MRSA by PCR negative  Antibiotics: NA   DVT prophylaxis Subcutaneous heparin    Objective: Filed Vitals:   02/03/16 0343 02/03/16 0354 02/03/16 0741 02/03/16 0752  BP: 128/69  156/71   Pulse: 63  66   Temp:   98.7 F (37.1 C)   TempSrc:   Oral   Resp: 22  29   Height:      Weight:  31 kg (68 lb 5.5 oz)    SpO2: 99%  99% 99%    Intake/Output Summary (Last 24 hours) at 02/03/16 1150 Last data filed at 02/02/16 1704  Gross per  24 hour  Intake     60 ml  Output      0 ml  Net     60 ml   Filed Weights   02/01/16 0528 02/02/16 0400 02/03/16 0354  Weight: 31.4 kg  (69 lb 3.6 oz) 31.2 kg (68 lb 12.5 oz) 31 kg (68 lb 5.5 oz)     Exam: General: AAOx3, No acute respiratory distress, extremely cachectic Lungs: Clear to auscultation bilaterally without wheezes or crackles Cardiovascular: Regular rate and rhythm without murmur gallop or rub normal S1 and S2 Abdomen:negative abdominal pain, negative dysphagia, nondistended, positive soft, bowel sounds, no rebound, no ascites, no appreciable mass Extremities: No significant cyanosis, clubbing, or edema bilateral lower extremities Psychiatric:  Negative depression, negative anxiety, negative fatigue, negative mania  Neurologic:  Cranial nerves II through XII intact, tongue/uvula midline, all extremities muscle strength 5/5, sensation intact throughout, negative dysarthria, negative expressive aphasia, negative receptive aphasia.   Data Reviewed: Basic Metabolic Panel:  Recent Labs Lab 01/30/16 0908 01/30/16 0927 01/31/16 0330 01/31/16 0852 02/02/16 0409  NA 139 139  --  140 142  K 3.2* 3.2*  --  3.6 3.2*  CL 99* 96*  --  107 109  CO2 32  --   --  24 23  GLUCOSE 96 93  --  93 88  BUN 23* 26*  --  28* 31*  CREATININE 1.77* 1.90*  --  1.62* 1.25*  CALCIUM 13.7*  --  11.4* 11.0* 9.5   Liver Function Tests:  Recent Labs Lab 01/30/16 0908 02/02/16 0409  AST 38 32  ALT 14 13*  ALKPHOS 66 54  BILITOT 0.6 0.5  PROT 6.5 5.0*  ALBUMIN 3.1* 2.3*   No results for input(s): LIPASE, AMYLASE in the last 168 hours. No results for input(s): AMMONIA in the last 168 hours. CBC:  Recent Labs Lab 01/30/16 0908 01/30/16 0927 01/31/16 0330 02/01/16 0350 02/02/16 0409 02/03/16 0439  WBC 4.3  --  4.9 4.3 4.6 4.4  HGB 12.3 15.0 9.4* 8.9* 8.8* 9.5*  HCT 36.8 44.0 28.0* 27.9* 27.7* 30.4*  MCV 92.5  --  92.7 93.3 93.9 94.7  PLT 127*  --  105* 104* 104* 135*   Cardiac Enzymes:  Recent Labs Lab 01/30/16 1648 01/30/16 2153 01/31/16 0330  TROPONINI 0.09* 0.10* 0.12*   BNP (last 3 results) No  results for input(s): BNP in the last 8760 hours.  ProBNP (last 3 results) No results for input(s): PROBNP in the last 8760 hours.  CBG: No results for input(s): GLUCAP in the last 168 hours.  Recent Results (from the past 240 hour(s))  MRSA PCR Screening     Status: None   Collection Time: 01/30/16  7:00 PM  Result Value Ref Range Status   MRSA by PCR NEGATIVE NEGATIVE Final    Comment:        The GeneXpert MRSA Assay (FDA approved for NASAL specimens only), is one component of a comprehensive MRSA colonization surveillance program. It is not intended to diagnose MRSA infection nor to guide or monitor treatment for MRSA infections.      Studies: Mr Abdomen Moise Boring Contrast  02/03/2016  ADDENDUM REPORT: 02/03/2016 11:26 CLINICAL DATA:  Indeterminate left renal lesions seen on recent CT. EXAM: MRI ABDOMEN WITHOUT AND WITH CONTRAST TECHNIQUE: Multiplanar multisequence MR imaging of the abdomen was performed both before and after the administration of intravenous contrast. CONTRAST:  2mL MULTIHANCE GADOBENATE DIMEGLUMINE 529 MG/ML IV SOLN COMPARISON:  CT on 01/30/2016 and  06/05/2011 FINDINGS: Exam is significantly degraded by motion artifact. Lower chest: New small bilateral pleural effusions and bibasilar atelectasis since prior exam. Hepatobiliary: No mass or other parenchymal abnormality identified. Pancreas: No mass, inflammatory changes, or other parenchymal abnormality identified. Spleen:  Within normal limits in size and appearance. Adrenals/Urinary Tract: Multiple tiny sub-cm simple appearing cysts are noted in the right kidney. The left kidney contains several simple appearing cysts. In addition, there are several lesions in the left kidney which show T1 hyperintensity, T2 hypo intensity, and no definite evidence of contrast enhancement on subtraction imaging. The largest of these measures 2.2 cm in the upper pole of the left kidney. These are consistent with hemorrhagic Bosniak  category 2 cysts. Although the study is degraded by motion artifact, no definite enhancing renal masses are identified. Stomach/Bowel: Visualized portions within the abdomen are unremarkable. Vascular/Lymphatic: No pathologically enlarged lymph nodes identified. No abdominal aortic aneurysm demonstrated. Other:  None. Musculoskeletal:  No suspicious bone lesions identified. IMPRESSION: Significant exam degradation due to motion artifact. Bilateral renal cysts noted, several in the left kidney most consistent with hemorrhagic Bosniak category 2 cysts. No definite renal neoplasm visualized. Due to the motion artifact on this exam, consider continued followup by abdomen CT without and with contrast in 6 months. New small bilateral pleural effusions and bibasilar atelectasis. Electronically Signed   By: Earle Gell M.D.   On: 02/03/2016 11:26  02/03/2016  EXAM: MRI ABDOMEN WITHOUT AND WITH CONTRAST TECHNIQUE: Multiplanar multisequence MR imaging of the abdomen was performed both before and after the administration of intravenous contrast. CONTRAST:  51mL MULTIHANCE GADOBENATE DIMEGLUMINE 529 MG/ML IV SOLN COMPARISON:  None. Electronically Signed: By: Earle Gell M.D. On: 02/03/2016 08:03   Dg Chest Port 1 View  02/01/2016  CLINICAL DATA:  Dyspnea EXAM: PORTABLE CHEST 1 VIEW COMPARISON:  01/31/2016 FINDINGS: There is marked hyperinflation. There are new basilar ground-glass opacities. There is new interstitial fluid or thickening. The rapid worsening of the interstitial prominence suggests fluid. No large effusions. Upper normal heart size, unchanged. Unremarkable hilar and mediastinal contours, unchanged. IMPRESSION: New interstitial thickening and mild basilar ground-glass opacities. The findings are most likely due to congestive heart failure superimposed on COPD. Electronically Signed   By: Andreas Newport M.D.   On: 02/01/2016 19:13    Scheduled Meds: . amLODipine  10 mg Oral Daily  . antiseptic oral rinse   7 mL Mouth Rinse BID  . aspirin  324 mg Oral Once  . feeding supplement (ENSURE ENLIVE)  237 mL Oral BID BM  . heparin subcutaneous  5,000 Units Subcutaneous 3 times per day  . hydrALAZINE  20 mg Oral TID  . isosorbide mononitrate  30 mg Oral Daily  . metoprolol tartrate  75 mg Oral BID  . mometasone-formoterol  2 puff Inhalation BID  . tiotropium  18 mcg Inhalation Daily   Continuous Infusions:    Active Problems:   Chronic diastolic CHF (congestive heart failure) (HCC)   COPD (chronic obstructive pulmonary disease) (HCC)   History of rectal cancer   Malnutrition of moderate degree (HCC)   Hypertensive emergency   AKI (acute kidney injury) ([place])   Weight loss   Multiple renal cysts   Hypercalcemia   Pressure ulcer   Cardiomyopathy ([place])   Chronic diastolic congestive heart failure (HCC)   Goals of care, counseling/discussion   Encounter for hospice care discussion   DNR (do not resuscitate)   Palliative care encounter   Sacral wound   Severe protein-calorie  malnutrition ([place])    Time spent: 21 minutes    [facility] Pager [phone]. If 7PM-7AM, please contact night-coverage at [email or web address], password [email or web address] 02/03/2016, 11:50 AM  LOS: 4 days

## 2016-02-03 NOTE — Consult Note (Addendum)
WOC wound consult note Reason for Consult: Consult requested for sacrum.  Pt had a wound to this site prior to admission, according to daughter, but it has declined.  Pt has had frequent incontinent stools and daughter admits she has had a poor appetite. She is very emaciated with a protruding sacral bone over the affected area. Wound type: Stage 2 pressure injury Pressure Ulcer POA: Yes Measurement: 1.5X2.5X.1cm Wound bed: Red and moist Drainage (amount, consistency, odor) No odor or drainage Periwound: Intact skin surrounding Dressing procedure/placement/frequency: Pt previously had a foam dressing to this location which was trapping stool against the skin.  Leave open to air and apply barrier cream to protect and repel moisture. Nutrition consult  has been ordered to optimize nutrition and  air mattress for pressure reduction.  Discussed with daughter that patient has multiple systemic factors which can impair healing and wound may decline despite optimal plan of care.  She verbalizes understanding. Please re-consult if further assistance is needed.  Thank-you,  Julien Girt MSN, [place], [place], [place], [place]

## 2016-02-03 NOTE — Progress Notes (Signed)
Nutrition Follow-up / Consult  DOCUMENTATION CODES:   Severe malnutrition in context of chronic illness, Underweight  INTERVENTION:    Continue Ensure Enlive po BID, each supplement provides 350 kcal and 20 grams of protein  Add chocolate Mighty Shakes TID with meals, each supplement provides 500 kcal and 23 gm protein  NUTRITION DIAGNOSIS:   Malnutrition related to chronic illness as evidenced by severe depletion of body fat, severe depletion of muscle mass.  Ongoing  GOAL:   Patient will meet greater than or equal to 90% of their needs  Unmet  MONITOR:   PO intake, Supplement acceptance, Weight trends, Skin  ASSESSMENT:   75 y.o. female with PMH of COPD, HTN, HLD, colorectal adenocarcinoma. She was brought to the ED on 4/3 AM for weakness and confusion, hypertensive emergency.Per family she's had a poor appetite and excessive weight loss. Code STEMI called.   Patient reports that she has been eating better. She drank some Ensure with her medications this morning. Patient with severe PCM related to chronic illness. Per RN, plans are to find out the results of MRI before deciding on plan of care, home with [facility] vs Hospice.   Diet Order:  DIET SOFT Room service appropriate?: Yes; Fluid consistency:: Thin  Skin:  Wound (see comment) (stage II pressure injury to coccyx)  Last BM:  4/7  Height:   Ht Readings from Last 1 Encounters:  01/30/16 5' (1.524 m)    Weight:   Wt Readings from Last 1 Encounters:  02/03/16 68 lb 5.5 oz (31 kg)    Ideal Body Weight:  45.5 kg  BMI:  Body mass index is 13.35 kg/(m^2).  Estimated Nutritional Needs:   Kcal:  1000-1200  Protein:  50-60 gm  Fluid:  >/=1.2 L  EDUCATION NEEDS:   No education needs identified at this time  Molli Barrows, [place], [place], [place] Pager [phone] After Hours Pager [phone]

## 2016-02-03 NOTE — Care Management Note (Addendum)
Case Management Note  Patient Details  Name: [name] MRN: [id number] Date of Birth: 12-22-1940  Subjective/Objective:  NCM spoke with patient's daughter , [name] [id number] , who is the caregiver in the room.  She would like to take her mother home with [facility],  She states patient has a shower bench, a bedside commode, and a transport chair at home.  She will need a hospital bed and oxygen when she goes home.  She will also need ambulance transport to [address], [place] [address] at discharge. NCM made referral to [facility], faxed over h/p, demographics, progress notes, meds, woc notes,  And nutrition notes.  Received call from [facility] with [facility] and she will be here to talk with Julia today at 3 pm.  Charisse Klinefelter spoke with patient's daughter, [name], and everything is set for patient to go home tomorrow with Home Hospice, she will need ambulance transport set up.                 Action/Plan:   Expected Discharge Date:                  Expected Discharge Plan:  Home w Hospice Care  In-House Referral:  Clinical Social Work  Discharge planning Services  CM Consult  Post Acute Care Choice:  Hospice Choice offered to:  Adult Children  DME Arranged:    DME Agency:     HH Arranged:    HH Agency:     Status of Service:  Completed, signed off  Medicare Important Message Given:  Yes Date Medicare IM Given:    Medicare IM give by:    Date Additional Medicare IM Given:    Additional Medicare Important Message give by:     If discussed at [place] of Stay Meetings, dates discussed:    Additional Comments:  [name], [name], RN 02/03/2016, 12:39 PM

## 2016-02-03 NOTE — Progress Notes (Signed)
Daily Progress Note   Patient Name: [name]       Date: 02/03/2016 DOB: [birth year]  Age: 75 y.o. MRN#: [id number] Attending Physician: Domenic Polite, MD Primary Care Physician: Shirline Frees, MD Admit Date: 01/30/2016  Reason for Consultation/Follow-up: Establishing goals of care  Subjective: Asks for a cookie.  No complaints  Interval Events: MRI appears negative for malignancy.  Patient Hospice eligible due to cachexia, weight loss, being bed bound, sacral wound, very poor PO intake.  Daughter will need hospital bed and wound care at home.   [name] prefers that her mother not be sent to SNF.  She also prefers to avoid hospitalization. She would refuse invasive procedures, artificial feeding, or chemo therapy.  [name] states that her mother will refuse PT/OT at home.  Both the patient and her daughter, [name], are focused on quality of life for [name] over quantity of days.   Length of Stay: 4 days  Current Medications: Scheduled Meds:  . amLODipine  10 mg Oral Daily  . antiseptic oral rinse  7 mL Mouth Rinse BID  . aspirin  324 mg Oral Once  . feeding supplement (ENSURE ENLIVE)  237 mL Oral BID BM  . heparin subcutaneous  5,000 Units Subcutaneous 3 times per day  . hydrALAZINE  20 mg Oral TID  . isosorbide mononitrate  30 mg Oral Daily  . metoprolol tartrate  75 mg Oral BID  . mometasone-formoterol  2 puff Inhalation BID  . tiotropium  18 mcg Inhalation Daily    Continuous Infusions:    PRN Meds: acetaminophen, albuterol   Physical Exam  Constitutional: She appears cachectic.  Neurological: No cranial nerve deficit.  Skin: Bruising and lesion noted.     Psychiatric:  Talkative, pleasantly confused.                Vital Signs: BP 156/71 mmHg  Pulse 66   Temp(Src) 98.7 F (37.1 C) (Oral)  Resp 29  Ht 5' (1.524 m)  Wt 31 kg (68 lb 5.5 oz)  BMI 13.35 kg/m2  SpO2 99% SpO2: SpO2: 99 % O2 Device: O2 Device: Nasal Cannula O2 Flow Rate: O2 Flow Rate (L/min): 2 L/min  Intake/output summary:  Intake/Output Summary (Last 24 hours) at 02/03/16 1101 Last data filed at 02/02/16 1704  Gross per 24 hour  Intake     60 ml  Output      0 ml  Net     60 ml   LBM: Last BM Date: 02/03/16 Baseline Weight: Weight: 36.288 kg (80 lb) Most recent weight: Weight: 31 kg (68 lb 5.5 oz)       Palliative Assessment/Data: Flowsheet Rows        Most Recent Value   Intake Tab    Referral Department  Hospitalist   Unit at Time of Referral  Intermediate Care Unit   Palliative Care Primary Diagnosis  Other (Comment)   Date Notified  02/01/16   Palliative Care Type  New Palliative care   Reason for referral  Clarify Goals of Care   Date of Admission  01/31/16   Date first seen by Palliative Care  02/02/16   # of days Palliative referral response time  1 Day(s)   # of days IP prior to Palliative referral  1   Clinical Assessment    Palliative Performance Scale Score  20%   Dyspnea Max Last 24 Hours  8   Dyspnea Min Last 24 hours  2   Psychosocial & Spiritual Assessment    Palliative Care Outcomes    Patient/Family meeting held?  Yes   Who was at the meeting?  daughter [name] and patient   Palliative Care Outcomes  Counseled regarding hospice, Clarified goals of care, Changed CPR status   Patient/Family wishes: Interventions discontinued/not started   Mechanical Ventilation      Additional Data Reviewed: CBC    Component Value Date/Time   WBC 4.4 02/03/2016 0439   WBC 3.5* 01/29/2008 1116   RBC 3.21* 02/03/2016 0439   RBC 4.36 01/29/2008 1116   RBC 3.06* 05/23/2007 0405   HGB 9.5* 02/03/2016 0439   HGB 13.9 01/29/2008 1116   HCT 30.4* 02/03/2016 0439   HCT 40.0 01/29/2008 1116   PLT 135* 02/03/2016 0439   PLT 134* 01/29/2008 1116   MCV 94.7  02/03/2016 0439   MCV 91.6 01/29/2008 1116   MCH 29.6 02/03/2016 0439   MCH 31.7 01/29/2008 1116   MCHC 31.3 02/03/2016 0439   MCHC 34.7 01/29/2008 1116   RDW 15.2 02/03/2016 0439   RDW 15.5* 01/29/2008 1116   LYMPHSABS 0.5* 12/15/2013 1240   LYMPHSABS 0.7* 01/29/2008 1116   MONOABS 0.6 12/15/2013 1240   MONOABS 0.4 01/29/2008 1116   EOSABS 0.0 12/15/2013 1240   EOSABS 0.1 01/29/2008 1116   BASOSABS 0.0 12/15/2013 1240   BASOSABS 0.0 01/29/2008 1116    CMP     Component Value Date/Time   NA 142 02/02/2016 0409   K 3.2* 02/02/2016 0409   CL 109 02/02/2016 0409   CO2 23 02/02/2016 0409   GLUCOSE 88 02/02/2016 0409   BUN 31* 02/02/2016 0409   CREATININE 1.25* 02/02/2016 0409   CALCIUM 9.5 02/02/2016 0409   PROT 5.0* 02/02/2016 0409   ALBUMIN 2.3* 02/02/2016 0409   AST 32 02/02/2016 0409   ALT 13* 02/02/2016 0409   ALKPHOS 54 02/02/2016 0409   BILITOT 0.5 02/02/2016 0409   GFRNONAA 41* 02/02/2016 0409   GFRAA 48* 02/02/2016 0409       Problem List:  Patient Active Problem List   Diagnosis Date Noted  . Sacral wound   . Severe protein-calorie malnutrition ([place])   . Goals of care, counseling/discussion   . Encounter for hospice care discussion   . DNR (do not resuscitate)   . Palliative care encounter   .  Pressure ulcer 01/31/2016  . Cardiomyopathy ([place])   . Chronic diastolic congestive heart failure ([place])   . Hypertensive emergency 01/30/2016  . AKI (acute kidney injury) ([place]) 01/30/2016  . Weight loss 01/30/2016  . Multiple renal cysts 01/30/2016  . Hypercalcemia 01/30/2016  . Postop Hyponatremia 05/29/2012  . Postop Acute blood loss anemia 05/29/2012  . Hip fracture, left ([place]) 05/27/2012  . Chronic diastolic CHF (congestive heart failure) ([place]) 05/27/2012  . HTN (hypertension) 05/27/2012  . Iron deficiency anemia 05/27/2012  . COPD (chronic obstructive pulmonary disease) ([place]) 05/27/2012  . History of rectal cancer 05/27/2012  . Lower GI bleed  05/27/2012  . Malnutrition of moderate degree ([place]) 05/27/2012     Palliative Care Assessment & Plan    1.Code Status:  DNR  2. Goals of Care/Additional Recommendations:  DNR/DNI  Home with Hospice Services when medically ready for D/C  (still uncertain about etiology of hypercalcemia)  Will need Wound care at home.  Daughter [name] wound like a diagnosis in order to understand/confirm how best to care for her mother,  But she does not want invasive treatments/surgery/chemotherapy.   3. Symptom Management:      1. Avoid benzodiazepines.  They make patient confused and loopy       2.  Continue supportive therapy for end stage COPD       3.  Will need a plan to follow up on hypercalcemia - if we expect it to recur.  4. Palliative Prophylaxis:   Delirium Protocol, Palliative Wound Care and Turn Reposition  5. Prognosis:  Less than 6 months given cachexia (68 lbs), end stage COPD, diastolic dysfunction with LVH and valvular stenosis, bed bound, poor PO intake.  6. Discharge Planning:  Home with Hospice when appropriate.   Care plan was discussed with [name] and [facility] MD.  Thank you for allowing the Palliative Medicine Team to assist in the care of this patient.   Time In: 10:10 Time Out: 11:00 Total Time 50 Prolonged Time Billed no        Melton Alar, PA-C  02/03/2016, 11:01 AM  Please contact Palliative Medicine Team phone at [phone] for questions and concerns.

## 2016-02-03 NOTE — Progress Notes (Signed)
New Admission Note:  Transfer from 3S  Arrival Method: bed Mental Orientation:  A/o x4 Telemetry: placed Assessment: Completed Skin: stage II on sacrum cover with barrier cream. Pink foams on both elbows for prophylaxsis IV: LFA SL, RFA SL Pain: none Tubes: none Safety Measures: Safety Fall Prevention Plan has been given, discussed and signed Admission: Completed Unit Orientation: Patient has been orientated to the room, unit and staff.  Family: none  Orders have been reviewed and implemented. Will continue to monitor the patient. Call light has been placed within reach and bed alarm has been activated.   Retta Mac BSN, RN

## 2016-02-04 ENCOUNTER — Inpatient Hospital Stay (HOSPITAL_COMMUNITY): Payer: Medicare Other

## 2016-02-04 LAB — CBC
HEMATOCRIT: 26.3 % — AB (ref 36.0–46.0)
Hemoglobin: 8.4 g/dL — ABNORMAL LOW (ref 12.0–15.0)
MCH: 30.3 pg (ref 26.0–34.0)
MCHC: 31.9 g/dL (ref 30.0–36.0)
MCV: 94.9 fL (ref 78.0–100.0)
PLATELETS: 120 10*3/uL — AB (ref 150–400)
RBC: 2.77 MIL/uL — ABNORMAL LOW (ref 3.87–5.11)
RDW: 15.3 % (ref 11.5–15.5)
WBC: 4.4 10*3/uL (ref 4.0–10.5)

## 2016-02-04 LAB — COMPREHENSIVE METABOLIC PANEL
ALT: 14 U/L (ref 14–54)
ANION GAP: 6 (ref 5–15)
AST: 29 U/L (ref 15–41)
Albumin: 2.2 g/dL — ABNORMAL LOW (ref 3.5–5.0)
Alkaline Phosphatase: 63 U/L (ref 38–126)
BILIRUBIN TOTAL: 0.4 mg/dL (ref 0.3–1.2)
BUN: 25 mg/dL — ABNORMAL HIGH (ref 6–20)
CO2: 26 mmol/L (ref 22–32)
Calcium: 8.6 mg/dL — ABNORMAL LOW (ref 8.9–10.3)
Chloride: 107 mmol/L (ref 101–111)
Creatinine, Ser: 1.02 mg/dL — ABNORMAL HIGH (ref 0.44–1.00)
GFR calc Af Amer: >60 mL/min (ref >60)
GFR, EST NON AFRICAN AMERICAN: 52 mL/min — AB (ref >60)
Glucose, Bld: 121 mg/dL — ABNORMAL HIGH (ref 65–99)
POTASSIUM: 3.7 mmol/L (ref 3.5–5.1)
Sodium: 139 mmol/L (ref 135–145)
TOTAL PROTEIN: 5 g/dL — AB (ref 6.5–8.1)

## 2016-02-04 MED ORDER — POTASSIUM CHLORIDE CRYS ER 20 MEQ PO TBCR
40.0000 meq | EXTENDED_RELEASE_TABLET | Freq: Two times a day (BID) | ORAL | Status: DC
  Administered 2016-02-04 (×2): 40 meq via ORAL
  Filled 2016-02-04 (×3): qty 2

## 2016-02-04 MED ORDER — HYDRALAZINE HCL 10 MG PO TABS
10.0000 mg | ORAL_TABLET | Freq: Three times a day (TID) | ORAL | Status: DC
  Administered 2016-02-04 – 2016-02-05 (×4): 10 mg via ORAL
  Filled 2016-02-04 (×4): qty 1

## 2016-02-04 MED ORDER — FUROSEMIDE 10 MG/ML IJ SOLN
20.0000 mg | Freq: Once | INTRAMUSCULAR | Status: AC
  Administered 2016-02-04: 20 mg via INTRAVENOUS
  Filled 2016-02-04: qty 2

## 2016-02-04 NOTE — Progress Notes (Addendum)
[facility] PROGRESS NOTE  YOUSTINA SUPPLEE [id number] DOB: 06-30-1941 DOA: 01/30/2016 PCP: Shirline Frees, MD Admit HPI / Brief Narrative: Julia KOHNEN is a 75 y.o. female PMHx  COPD, HTN, HLD, Colorectal Adenocarcinoma.  Patient brought to the ED this morning for weakness and confusion. Per family she's had excessive weight loss. EKG noted by EMS, code STEMI called. EDP spoke with cardiology who will see the patient. Patient with significantly elevated blood pressure on arrival. Initial troponin 0.09. Patient started on heparin drip and nitroglycerin drip, BP improved from initial reading of 215/ 99 to 125/70.  Patient denies chest pain. She mainly complains of excessive sleepiness. She uses inhalers at home, denies cough or shortness of breath. Her appetite is poor and patient reported excessive weight loss over last few months.   HPI/Subjective: Some shortness of breath off and on per Pt this am  Assessment/Plan:  Metabolic encephalopathy -Suspect secondary to hypercalcemia and AKI -improving -Corrected calcium level was greater than 13, down to normal -given a dose of IV pamidronate 4/5  Acute on Chronic diastolic heart failure.  - lasix x1 4/6 -EF is normal -appears slightly volume overloaded again today, lasix x1 now with KCL -due to fragility and poor Po intake will DC home on lasix 20mg  PRN only  Hypercalcemia -improved, clinically suspect Hypercalcemia of malignancy -As above -PTH low, PTHrp still pending -1,25Vitamin D slightly low -SPEP/UPEP, immunofixation not suggestive of Myeloma -MRI abdomen does not show concrete renal masses -since former smoker will get CT chest unable to give contrast due to CKD -if unable to find etiology will need to be monitored periodically  Elevated troponin -  -Cardiology consulting, no STEMI, likely demand ischemia/secondary to accelerated hypertension -2-D echocardiogram with normal EF and wall motion, severe LVH and grade  1 diastolic dysfunction -no further cardiac workup planned  Hypertensive emergency/dilated cardiomyopathy  -improved -Stop Nitro gtt -Continue Amlodipine 10 mg, Hydralazine, Metoprolol  AKI. (Baseline Cr 1.07),  -. AKI in setting of hypertensive emergency. Should improve with treatment of HTN -improving -held ACE,  lasix 4/6  Severe protein calorie malnutrition -malignancy workup negative thus far -weight down to 68lbs  Thrombocytopenia., chronic. Stable count at 127.   Enlarging renal lesions -MRI not consistent with malignancy likely simple and complex cysts, will need FU imaging   COPD.  -stable -nebs PRN  Hx of coloectal cancer, s/p LAR / chemoradiation in 2008.  Ethics: -palliative following, plan for Hospice at discharge  DVT proph: Hep SQ  Code Status:DNR Family Communication: daughter at bedside Disposition Plan: Home with Hospice tomorrow   Consultants: Palliative care pending  Procedures: 4/3 CT abdomen pelvis without contrast;Increasing size and number of multiple exophytic lesions involving the left kidney. Recommend MRI of the kidney without with contrast for further evaluation. 2. Hyperdense lesion at the upper pole of the left kidney has increased in size, now measuring 1.7 x 2.0 cm. 3. Extensive atherosclerotic change without definite stenosis or aneurysm. 4. Mild degenerative changes in the lower lumbar spine are similar to the prior study. 4/4 echocardiogram; Left ventricle: severe LVH.-LVEF=65%-70%. -(grade 1 diastolic dysfunction).  - Left atrium: severely dilated. - Pericardium, extracardiac: A small pericardial effusion     Cultures 4/3 MRSA by PCR negative  Antibiotics: NA   DVT prophylaxis Subcutaneous heparin    Objective: Filed Vitals:   02/03/16 2008 02/04/16 0600 02/04/16 0858 02/04/16 0911  BP: 128/54 126/52  125/58  Pulse: 63 62  63  Temp: 97.8 F (36.6 C) 97.8 F (  36.6 C)  97.5 F (36.4 C)  TempSrc: Oral  Oral  Oral  Resp: 18 20  18   Height:      Weight: 37.785 kg (83 lb 4.8 oz)     SpO2: 100% 99% 96% 100%    Intake/Output Summary (Last 24 hours) at 02/04/16 1027 Last data filed at 02/04/16 0932  Gross per 24 hour  Intake    680 ml  Output      0 ml  Net    680 ml   Filed Weights   02/03/16 0354 02/03/16 1346 02/03/16 2008  Weight: 31 kg (68 lb 5.5 oz) 37.059 kg (81 lb 11.2 oz) 37.785 kg (83 lb 4.8 oz)     Exam: General: AAOx3, No acute respiratory distress, extremely cachectic Lungs: Clear to auscultation bilaterally without wheezes or crackles Cardiovascular: Regular rate and rhythm without murmur gallop or rub normal S1 and S2 Abdomen:negative abdominal pain, negative dysphagia, nondistended, positive soft, bowel sounds, no rebound, no ascites, no appreciable mass Extremities: No significant cyanosis, clubbing, or edema bilateral lower extremities Psychiatric:  Negative depression, negative anxiety, negative fatigue, negative mania  Neurologic:  Cranial nerves II through XII intact, tongue/uvula midline, all extremities muscle strength 5/5, sensation intact throughout, negative dysarthria, negative expressive aphasia, negative receptive aphasia.   Data Reviewed: Basic Metabolic Panel:  Recent Labs Lab 01/30/16 0908 01/30/16 0927 01/31/16 0330 01/31/16 0852 02/02/16 0409  NA 139 139  --  140 142  K 3.2* 3.2*  --  3.6 3.2*  CL 99* 96*  --  107 109  CO2 32  --   --  24 23  GLUCOSE 96 93  --  93 88  BUN 23* 26*  --  28* 31*  CREATININE 1.77* 1.90*  --  1.62* 1.25*  CALCIUM 13.7*  --  11.4* 11.0* 9.5   Liver Function Tests:  Recent Labs Lab 01/30/16 0908 02/02/16 0409  AST 38 32  ALT 14 13*  ALKPHOS 66 54  BILITOT 0.6 0.5  PROT 6.5 5.0*  ALBUMIN 3.1* 2.3*   No results for input(s): LIPASE, AMYLASE in the last 168 hours. No results for input(s): AMMONIA in the last 168 hours. CBC:  Recent Labs Lab 01/31/16 0330 02/01/16 0350 02/02/16 0409  02/03/16 0439 02/04/16 0611  WBC 4.9 4.3 4.6 4.4 4.4  HGB 9.4* 8.9* 8.8* 9.5* 8.4*  HCT 28.0* 27.9* 27.7* 30.4* 26.3*  MCV 92.7 93.3 93.9 94.7 94.9  PLT 105* 104* 104* 135* 120*   Cardiac Enzymes:  Recent Labs Lab 01/30/16 1648 01/30/16 2153 01/31/16 0330  TROPONINI 0.09* 0.10* 0.12*   BNP (last 3 results) No results for input(s): BNP in the last 8760 hours.  ProBNP (last 3 results) No results for input(s): PROBNP in the last 8760 hours.  CBG: No results for input(s): GLUCAP in the last 168 hours.  Recent Results (from the past 240 hour(s))  MRSA PCR Screening     Status: None   Collection Time: 01/30/16  7:00 PM  Result Value Ref Range Status   MRSA by PCR NEGATIVE NEGATIVE Final    Comment:        The GeneXpert MRSA Assay (FDA approved for NASAL specimens only), is one component of a comprehensive MRSA colonization surveillance program. It is not intended to diagnose MRSA infection nor to guide or monitor treatment for MRSA infections.      Studies: Mr Abdomen Moise Boring Contrast  02/03/2016  ADDENDUM REPORT: 02/03/2016 11:26 CLINICAL DATA:  Indeterminate left renal  lesions seen on recent CT. EXAM: MRI ABDOMEN WITHOUT AND WITH CONTRAST TECHNIQUE: Multiplanar multisequence MR imaging of the abdomen was performed both before and after the administration of intravenous contrast. CONTRAST:  73mL MULTIHANCE GADOBENATE DIMEGLUMINE 529 MG/ML IV SOLN COMPARISON:  CT on 01/30/2016 and 06/05/2011 FINDINGS: Exam is significantly degraded by motion artifact. Lower chest: New small bilateral pleural effusions and bibasilar atelectasis since prior exam. Hepatobiliary: No mass or other parenchymal abnormality identified. Pancreas: No mass, inflammatory changes, or other parenchymal abnormality identified. Spleen:  Within normal limits in size and appearance. Adrenals/Urinary Tract: Multiple tiny sub-cm simple appearing cysts are noted in the right kidney. The left kidney contains several  simple appearing cysts. In addition, there are several lesions in the left kidney which show T1 hyperintensity, T2 hypo intensity, and no definite evidence of contrast enhancement on subtraction imaging. The largest of these measures 2.2 cm in the upper pole of the left kidney. These are consistent with hemorrhagic Bosniak category 2 cysts. Although the study is degraded by motion artifact, no definite enhancing renal masses are identified. Stomach/Bowel: Visualized portions within the abdomen are unremarkable. Vascular/Lymphatic: No pathologically enlarged lymph nodes identified. No abdominal aortic aneurysm demonstrated. Other:  None. Musculoskeletal:  No suspicious bone lesions identified. IMPRESSION: Significant exam degradation due to motion artifact. Bilateral renal cysts noted, several in the left kidney most consistent with hemorrhagic Bosniak category 2 cysts. No definite renal neoplasm visualized. Due to the motion artifact on this exam, consider continued followup by abdomen CT without and with contrast in 6 months. New small bilateral pleural effusions and bibasilar atelectasis. Electronically Signed   By: Earle Gell M.D.   On: 02/03/2016 11:26  02/03/2016  EXAM: MRI ABDOMEN WITHOUT AND WITH CONTRAST TECHNIQUE: Multiplanar multisequence MR imaging of the abdomen was performed both before and after the administration of intravenous contrast. CONTRAST:  10mL MULTIHANCE GADOBENATE DIMEGLUMINE 529 MG/ML IV SOLN COMPARISON:  None. Electronically Signed: By: Earle Gell M.D. On: 02/03/2016 08:03    Scheduled Meds: . amLODipine  10 mg Oral Daily  . antiseptic oral rinse  7 mL Mouth Rinse BID  . aspirin  324 mg Oral Once  . feeding supplement (ENSURE ENLIVE)  237 mL Oral BID BM  . furosemide  20 mg Intravenous Once  . heparin subcutaneous  5,000 Units Subcutaneous 3 times per day  . hydrALAZINE  20 mg Oral TID  . isosorbide mononitrate  30 mg Oral Daily  . metoprolol tartrate  75 mg Oral BID  .  mometasone-formoterol  2 puff Inhalation BID  . potassium chloride  40 mEq Oral BID  . tiotropium  18 mcg Inhalation Daily   Continuous Infusions:    Active Problems:   Chronic diastolic CHF (congestive heart failure) (HCC)   COPD (chronic obstructive pulmonary disease) (HCC)   History of rectal cancer   Malnutrition of moderate degree (HCC)   Hypertensive emergency   AKI (acute kidney injury) ([place])   Weight loss   Multiple renal cysts   Hypercalcemia   Pressure ulcer   Cardiomyopathy ([place])   Chronic diastolic congestive heart failure ([place])   Goals of care, counseling/discussion   Encounter for hospice care discussion   DNR (do not resuscitate)   Palliative care encounter   Sacral wound   Severe protein-calorie malnutrition ([place])    Time spent: 21 minutes    [facility] Pager [phone]. If 7PM-7AM, please contact night-coverage at [email or web address], password [facility] 02/04/2016, 10:27 AM  LOS: 5 days

## 2016-02-04 NOTE — Care Management (Signed)
CM discussed with Dr. Broadus John anticipate discharge tomorrow 4/9 with [facility]. Daughter updated as Dr. Broadus John. E quipment is being delivered today, start of care will be arranged for tomorrow 4/9, per Bambi Liaison with [facility]. Patient will be transported via [facility]. CM will contact Bambi [phone] prior to discharge.

## 2016-02-05 LAB — CBC
HEMATOCRIT: 27.4 % — AB (ref 36.0–46.0)
HEMOGLOBIN: 8.5 g/dL — AB (ref 12.0–15.0)
MCH: 29.6 pg (ref 26.0–34.0)
MCHC: 31 g/dL (ref 30.0–36.0)
MCV: 95.5 fL (ref 78.0–100.0)
Platelets: 149 10*3/uL — ABNORMAL LOW (ref 150–400)
RBC: 2.87 MIL/uL — ABNORMAL LOW (ref 3.87–5.11)
RDW: 15.3 % (ref 11.5–15.5)
WBC: 4.8 10*3/uL (ref 4.0–10.5)

## 2016-02-05 LAB — COMPREHENSIVE METABOLIC PANEL
ALBUMIN: 2.4 g/dL — AB (ref 3.5–5.0)
ALK PHOS: 69 U/L (ref 38–126)
ALT: 17 U/L (ref 14–54)
AST: 34 U/L (ref 15–41)
Anion gap: 9 (ref 5–15)
BILIRUBIN TOTAL: 0.5 mg/dL (ref 0.3–1.2)
BUN: 27 mg/dL — AB (ref 6–20)
CALCIUM: 8.5 mg/dL — AB (ref 8.9–10.3)
CO2: 23 mmol/L (ref 22–32)
CREATININE: 1.05 mg/dL — AB (ref 0.44–1.00)
Chloride: 109 mmol/L (ref 101–111)
GFR calc Af Amer: 59 mL/min — ABNORMAL LOW (ref >60)
GFR, EST NON AFRICAN AMERICAN: 51 mL/min — AB (ref >60)
GLUCOSE: 77 mg/dL (ref 65–99)
Potassium: 5.3 mmol/L — ABNORMAL HIGH (ref 3.5–5.1)
Sodium: 141 mmol/L (ref 135–145)
TOTAL PROTEIN: 5.5 g/dL — AB (ref 6.5–8.1)

## 2016-02-05 LAB — BASIC METABOLIC PANEL
Anion gap: 8 (ref 5–15)
BUN: 23 mg/dL — ABNORMAL HIGH (ref 6–20)
CALCIUM: 8.5 mg/dL — AB (ref 8.9–10.3)
CO2: 27 mmol/L (ref 22–32)
Chloride: 108 mmol/L (ref 101–111)
Creatinine, Ser: 1.06 mg/dL — ABNORMAL HIGH (ref 0.44–1.00)
GFR calc Af Amer: 58 mL/min — ABNORMAL LOW (ref >60)
GFR, EST NON AFRICAN AMERICAN: 50 mL/min — AB (ref >60)
Glucose, Bld: 120 mg/dL — ABNORMAL HIGH (ref 65–99)
POTASSIUM: 4.6 mmol/L (ref 3.5–5.1)
Sodium: 143 mmol/L (ref 135–145)

## 2016-02-05 LAB — CALCIUM, IONIZED: CALCIUM, IONIZED, SERUM: 5.3 mg/dL (ref 4.5–5.6)

## 2016-02-05 LAB — VITAMIN A: Vitamin A (Retinoic Acid): 49 ug/dL (ref 26–82)

## 2016-02-05 MED ORDER — METOPROLOL TARTRATE 50 MG PO TABS: 50.0000 mg | ORAL_TABLET | Freq: Two times a day (BID) | ORAL | Status: AC

## 2016-02-05 MED ORDER — SODIUM POLYSTYRENE SULFONATE 15 GM/60ML PO SUSP
15.0000 g | Freq: Once | ORAL | Status: AC
  Administered 2016-02-05: 15 g via ORAL
  Filled 2016-02-05: qty 60

## 2016-02-05 MED ORDER — FUROSEMIDE 20 MG PO TABS: 20.0000 mg | ORAL_TABLET | Freq: Every day | ORAL | Status: AC

## 2016-02-05 MED ORDER — FUROSEMIDE 40 MG PO TABS: 40.0000 mg | ORAL_TABLET | ORAL | Status: DC

## 2016-02-05 MED ORDER — FUROSEMIDE 10 MG/ML IJ SOLN
20.0000 mg | Freq: Once | INTRAMUSCULAR | Status: AC
  Administered 2016-02-05: 20 mg via INTRAVENOUS
  Filled 2016-02-05: qty 2

## 2016-02-05 NOTE — Progress Notes (Signed)
CM contacted [name] regarding confirmation of start of care today. CM confirmed with patient and daughter of equipment delivery and that patient will be transported home by [facility]. Updated [name] RN on [place], nurse will contact [facility] and daughter when patient is ready for discharge. No further CM needs identified.

## 2016-02-05 NOTE — Progress Notes (Signed)
Pt discharged home per MD. Discharge instructions reviewed with patient's daughter, [name], via telephone. Prescriptions and copy of discharge instructions sent with patient and [facility]. Patient signed discharge instructions. Bartholomew Crews, RN

## 2016-02-05 NOTE — Discharge Summary (Addendum)
Physician Discharge Summary  [name] PPJ:[id number] DOB: December 01, 1940 DOA: 01/30/2016  PCP: Shirline Frees, MD  Admit date: 01/30/2016 Discharge date: 02/05/2016  Time spent: 45 minutes  Recommendations for Outpatient Follow-up:  1. Home Hospice 2. PTHrp still pending at discharge, to determine if this truly was Hypercalcemia of Malignancy 3. PCP Shirline Frees in 1 week, please follow Calcium level periodically, discharging home on lasix 47m daily, and can change to PRN if PO intake is poor  Discharge Diagnoses:    Hypercalcemia   Metabolic encephalopathy   Acute on Chronic diastolic CHF (congestive heart failure) (HCC)   COPD (chronic obstructive pulmonary disease) (HCC)   Severe Protein calorie malnutrition   History of rectal cancer   Malnutrition of moderate degree (HCC)   Hypertensive emergency   AKI (acute kidney injury) ([facility]   CKD 3   Weight loss   Multiple renal cysts   Hypercalcemia   Pressure ulcer   Cardiomyopathy ([facility]   Chronic diastolic congestive heart failure ([facility]   Goals of care, counseling/discussion   Encounter for hospice care discussion   DNR (do not resuscitate)   Palliative care encounter   Sacral wound   Severe protein-calorie malnutrition ([facility]   Discharge Condition: guarded  Diet recommendation: low sodium  Filed Weights   02/03/16 0354 02/03/16 1346 02/03/16 2008  Weight: 31 kg (68 lb 5.5 oz) 37.059 kg (81 lb 11.2 oz) 37.785 kg (83 lb 4.8 oz)    History of present illness:  Patient presents to the ED with AMS, lethargy, which has gotten worse over the last few days. Family reported that over the weekend, she has been increasingly lethargic. She has also had decreased po intake for quite some time now and has had atleast 20lbs unintentional weight loss over the last month.   Hospital Course:  Metabolic encephalopathy -Suspect secondary to hypercalcemia and AKI -improving -Corrected calcium level was greater than 13, down to normal  now -was given a dose of IV pamidronate 4/5  Hypercalcemia -improved, clinically suspect Hypercalcemia of malignancy, but unable to find any evidence of malignancy at this time -As above, -s/p 1 dose of IV pamidronate 4/5, this effect can last few weeks -PTH low, PTHrp still pending -1,25Vitamin D slightly low -SPEP/UPEP, immunofixation not suggestive of Myeloma -MRI abdomen does not show concrete renal masses -since former smoker, got CT chest: no lung masses noted -since unable to find etiology, this will need to be monitored periodically  Acute on Chronic diastolic heart failure.  -required IV lasix on 4/6 and 4/8 -EF is normal, ECHO with diastolic dysfunction -volume status improved -DC home on lasix 224mdaily, and can change to PRN if PO intake is poor  Elevated troponin -  -Cardiology consulted, no STEMI, suspected to have demand ischemia/secondary to accelerated hypertension -2-D echocardiogram with normal EF and wall motion, severe LVH and grade 1 diastolic dysfunction -no further cardiac workup planned  Hypertensive emergency/dilated cardiomyopathy  -improved -required Nitro gtt on admission -now on Hydralazine, Metoprolol and lasix, bp stable  AKI. (Baseline Cr 1.07) on CKD3 -. AKI in setting of hypertensive emergency. Should improve with treatment of HTN -improving -stopped ACE,given lasix 4/6  Severe protein calorie malnutrition -malignancy workup negative thus far -weight down to 80 lbs  Thrombocytopenia -chronic. Stable count at 127.   Enlarging renal lesions -MRI not consistent with malignancy likely simple and complex cysts, radiologist recommended FU imaging  COPD.  -stable -nebs PRN  Hx of coloectal cancer -s/p LAR / chemoradiation in  2008.  Ethics: -palliative consulted by admitting MD due to ongoing failure to thrive, weight loss, severe malnutrition, Palliative medicine met with patient and daughter and they elected to go home with  Hospice, no definitive malignancy detected on workup here  Code Status:DNR Disposition Plan: Home with Hospice today  Consultations:  Cardiology  Palliative medicine  Discharge Exam: Filed Vitals:   02/05/16 0600 02/05/16 0852  BP: 119/52 127/64  Pulse: 54 63  Temp: 97.8 F (36.6 C) 98 F (36.7 C)  Resp: 18 18    General: AAOx3 Cardiovascular: S1S2/RRR Respiratory: diminished BS at bases  Discharge Instructions   Discharge Instructions    Diet - low sodium heart healthy    Complete by:  As directed      Increase activity slowly    Complete by:  As directed           Current Discharge Medication List    START taking these medications   Details  furosemide (LASIX) 20 MG tablet Take 1 tablet (20 mg total) by mouth daily. Please take daily for 3days the as needed for swelling/fluid gain Qty: 30 tablet, Refills: 0      CONTINUE these medications which have CHANGED   Details  metoprolol tartrate (LOPRESSOR) 50 MG tablet Take 1 tablet (50 mg total) by mouth 2 (two) times daily. Qty: 60 tablet, Refills: 0      CONTINUE these medications which have NOT CHANGED   Details  acetaminophen (TYLENOL) 650 MG CR tablet Take 650 mg by mouth 2 (two) times daily as needed. For pain    budesonide-formoterol (SYMBICORT) 160-4.5 MCG/ACT inhaler Inhale 2 puffs into the lungs 2 (two) times daily.    feeding supplement (ENSURE COMPLETE) LIQD Take 237 mLs by mouth 2 (two) times daily between meals.    hydrALAZINE (APRESOLINE) 10 MG tablet Take 10 mg by mouth 3 (three) times daily.    Multiple Vitamin (MULTIVITAMIN WITH MINERALS) TABS Take 1 tablet by mouth daily.    PROAIR HFA 108 (90 BASE) MCG/ACT inhaler Inhale 2 puffs into the lungs every 4 (four) hours as needed for wheezing or shortness of breath.  Refills: 0    tiotropium (SPIRIVA) 18 MCG inhalation capsule Place 18 mcg into inhaler and inhale daily.    calcium-vitamin D (OSCAL WITH D) 500-200 MG-UNIT per tablet Take 1  tablet by mouth 2 (two) times daily.      STOP taking these medications     Calcium Carb-Cholecalciferol 600-800 MG-UNIT TABS      lisinopril (PRINIVIL,ZESTRIL) 10 MG tablet      ferrous sulfate 325 (65 FE) MG tablet        Allergies  Allergen Reactions  . Morphine And Related Other (See Comments)    "makes me crazy"  . Ciprofloxacin Hives and Rash   Follow-up Information    Follow up with [facility].   Specialty:  Hospice Services   Why:  will need ambulance transport at discharge   Contact information:   [place] [address] [phone]       Follow up with Shirline Frees, MD In 2 weeks.   Specialty:  Family Medicine   Contact information:   [address] [place] [address] [phone]        The results of significant diagnostics from this hospitalization (including imaging, microbiology, ancillary and laboratory) are listed below for reference.    Significant Diagnostic Studies: Ct Abdomen Pelvis Wo Contrast  01/30/2016  CLINICAL DATA:  Lower abdominal pain for 1 week. EXAM: CT ABDOMEN AND PELVIS WITHOUT CONTRAST TECHNIQUE: Multidetector CT imaging of the abdomen and pelvis was performed following the standard protocol without IV contrast. COMPARISON:  CT of the abdomen and pelvis with contrast 06/05/2011 FINDINGS: Lower chest: The lung bases are clear without focal nodule, mass, or airspace disease. The heart size is normal. A pericardial effusion is present. No significant pleural effusions are present. Hepatobiliary: No focal hepatic lesions are present. The common bile duct and gallbladder are normal. Pancreas: The pancreas is within normal limits. Spleen: The spleen is unremarkable. Adrenals/Urinary Tract: The adrenal glands are normal bilaterally. Multiple exophytic lesions of the left kidney have increased in size the largest low-density lesion is a lower pole, measuring 2.3 cm. There are several hyperdense  lesions now present. The largest is at the left upper pole measured 1.7 x 2.0 cm. This lesion was more hypodense in 2012. Ureters are within normal limits. The urinary bladder is unremarkable. Stomach/Bowel: The stomach and duodenum are within normal limits. The small bowel is unremarkable. The appendix is not discretely visualized and may be surgically absent. The ascending and transverse colon are within normal limits. The descending and rectosigmoid colon are unremarkable. A moderate amount stool is present throughout the colon. Vascular/Lymphatic: Dense atherosclerotic calcifications are present within the aorta and branch vessels. There is no significant adenopathy. Reproductive: Within normal limits for age. Other: No significant free fluid is present. Musculoskeletal: A vacuum disc at L5-S1 is stable. And calcification of the L3-4 disc is again noted. Vertebral body heights and alignment are maintained. Moderate central canal and bilateral foraminal stenosis is evident at L4-5. IMPRESSION: 1. Increasing size and number of multiple exophytic lesions involving the left kidney. Recommend MRI of the kidney without with contrast for further evaluation. 2. Hyperdense lesion at the upper pole of the left kidney has increased in size, now measuring 1.7 x 2.0 cm. 3. Extensive atherosclerotic change without definite stenosis or aneurysm. 4. Mild degenerative changes in the lower lumbar spine are similar to the prior study. Electronically Signed   By: San Morelle M.D.   On: 01/30/2016 11:33   Mr Abdomen W Wo Contrast  02/03/2016  ADDENDUM REPORT: 02/03/2016 11:26 CLINICAL DATA:  Indeterminate left renal lesions seen on recent CT. EXAM: MRI ABDOMEN WITHOUT AND WITH CONTRAST TECHNIQUE: Multiplanar multisequence MR imaging of the abdomen was performed both before and after the administration of intravenous contrast. CONTRAST:  17m MULTIHANCE GADOBENATE DIMEGLUMINE 529 MG/ML IV SOLN COMPARISON:  CT on 01/30/2016  and 06/05/2011 FINDINGS: Exam is significantly degraded by motion artifact. Lower chest: New small bilateral pleural effusions and bibasilar atelectasis since prior exam. Hepatobiliary: No mass or other parenchymal abnormality identified. Pancreas: No mass, inflammatory changes, or other parenchymal abnormality identified. Spleen:  Within normal limits in size and appearance. Adrenals/Urinary Tract: Multiple tiny sub-cm simple appearing cysts are noted in the right kidney. The left kidney contains several simple appearing cysts. In addition, there are several lesions in the left kidney which show T1 hyperintensity, T2 hypo intensity, and no definite evidence of contrast enhancement on subtraction imaging. The largest of these measures 2.2 cm in the upper pole of the left kidney. These are consistent with hemorrhagic Bosniak category 2 cysts. Although the study is degraded by motion artifact, no definite enhancing renal masses are identified. Stomach/Bowel: Visualized portions within the abdomen are unremarkable. Vascular/Lymphatic: No pathologically enlarged lymph nodes identified. No abdominal aortic aneurysm demonstrated. Other:  None. Musculoskeletal:  No suspicious bone lesions identified. IMPRESSION: Significant exam degradation due to motion artifact. Bilateral renal cysts noted, several in the left kidney most consistent with hemorrhagic Bosniak category 2 cysts. No definite renal neoplasm visualized. Due to the motion artifact on this exam, consider continued followup by abdomen CT without and with contrast in 6 months. New small bilateral pleural effusions and bibasilar atelectasis. Electronically Signed   By: Earle Gell M.D.   On: 02/03/2016 11:26  02/03/2016  EXAM: MRI ABDOMEN WITHOUT AND WITH CONTRAST TECHNIQUE: Multiplanar multisequence MR imaging of the abdomen was performed both before and after the administration of intravenous contrast. CONTRAST:  62m MULTIHANCE GADOBENATE DIMEGLUMINE 529 MG/ML IV  SOLN COMPARISON:  None. Electronically Signed: By: JEarle GellM.D. On: 02/03/2016 08:03   Ct Chest High Resolution  02/04/2016  CLINICAL DATA:  COPD.  Hypercalcemia. EXAM: CT CHEST WITHOUT CONTRAST TECHNIQUE: Multidetector CT imaging of the chest was performed following the standard protocol without intravenous contrast. High resolution imaging of the lungs, as well as inspiratory and expiratory imaging, was performed. COMPARISON:  CT of the chest January 29, 2008. Chest x-rays from the last few days. FINDINGS: The central airways are within normal limits. No pneumothorax. Severe emphysematous changes are seen in the lungs. Mild peripheral reticulations/interlobular septal thickening are identified, likely due to superimposed edema as suggested on the February 01, 2016 chest x-ray. No interstitial abnormalities were seen on the January 31, 2016 chest x-ray. There are bilateral effusions, right greater than left which are small to moderate in size. There is atelectasis associated with the right effusion. There is certainly atelectasis associated with the left effusion as well. However, there is also some opacity anterior to left effusion which could represent further atelectasis or infiltrate. There is also mild patchy nodularity in the right middle lobe on series 5, images 41 through 47. There is some respiratory motion in this region limiting evaluation. No suspicious pulmonary mass. No evidence of pulmonary fibrosis on high-resolution images. No bronchiectasis. No focal air trapping on expiratory images despite known emphysema. The thoracic aorta is non aneurysmal. The pulmonary arteries are within normal limits in caliber. Coronary artery calcifications identified. The heart size is normal. No adenopathy. No pericardial effusion. Evaluation of the upper abdomen is limited. Left renal cysts are identified. Atherosclerotic changes are noted. No acute bony abnormalities. IMPRESSION: 1. Bilateral pleural effusions. 2.  Peripheral reticulations/interlobular septal thickening are consistent with pulmonary edema. The interstitial findings developed between the January 31, 2016 chest x-ray and the February 01, 2016 chest x-ray. No evidence of chronic interstitial lung disease or fibrosis. 3. Severe emphysematous changes in the lungs. 4. Patchy nodular opacity in the right middle lobe is probably infectious or inflammatory. Opacity anterior to the left effusion may be atelectasis or infiltrate. Recommend follow-up to resolution. Electronically Signed   By: DDorise BullionIII M.D   On: 02/04/2016 13:16   Dg Chest Port 1 View  02/01/2016  CLINICAL DATA:  Dyspnea EXAM: PORTABLE CHEST 1 VIEW COMPARISON:  01/31/2016 FINDINGS: There is marked hyperinflation. There are new basilar ground-glass opacities. There is new interstitial fluid or thickening. The rapid worsening of the interstitial prominence suggests fluid. No large effusions. Upper normal heart size, unchanged. Unremarkable hilar and mediastinal contours, unchanged. IMPRESSION: New interstitial thickening and mild basilar ground-glass opacities. The findings are most likely due to congestive heart failure superimposed on COPD. Electronically Signed   By: DAndreas NewportM.D.   On: 02/01/2016 19:13   Dg Chest Port 1  View  01/31/2016  CLINICAL DATA:  Tachypnea EXAM: PORTABLE CHEST - 1 VIEW COMPARISON:  Two-view chest x-ray 01/30/2016 FINDINGS: The heart size is normal. There is no edema or effusion to suggest failure. Emphysema is again noted. Scarring is noted at the lung apices bilaterally. IMPRESSION: 1. Emphysema. 2. No acute cardiopulmonary disease. Electronically Signed   By: San Morelle M.D.   On: 01/31/2016 07:20   Dg Chest Port 1 View  01/30/2016  CLINICAL DATA:  Shortness of Breath. EXAM: PORTABLE CHEST 1 VIEW COMPARISON:  05/27/2012 FINDINGS: There is hyperinflation of the lungs compatible with COPD. Heart and mediastinal contours are within normal limits. No  focal opacities or effusions. No acute bony abnormality. IMPRESSION: COPD.  No active disease. Electronically Signed   By: Rolm Baptise M.D.   On: 01/30/2016 10:26    Microbiology: Recent Results (from the past 240 hour(s))  MRSA PCR Screening     Status: None   Collection Time: 01/30/16  7:00 PM  Result Value Ref Range Status   MRSA by PCR NEGATIVE NEGATIVE Final    Comment:        The GeneXpert MRSA Assay (FDA approved for NASAL specimens only), is one component of a comprehensive MRSA colonization surveillance program. It is not intended to diagnose MRSA infection nor to guide or monitor treatment for MRSA infections.      Labs: Basic Metabolic Panel:  Recent Labs Lab 01/30/16 0908 01/30/16 0927 01/31/16 0330 01/31/16 0852 02/02/16 0409 02/04/16 1113 02/05/16 0436  NA 139 139  --  140 142 139 141  K 3.2* 3.2*  --  3.6 3.2* 3.7 5.3*  CL 99* 96*  --  107 109 107 109  CO2 32  --   --  _0 GLUCOSE 96 93  --  93 88 121* 77  BUN 23* 26*  --  28* 31* 25* 27*  CREATININE 1.77* 1.90*  --  1.62* 1.25* 1.02* 1.05*  CALCIUM 13.7*  --  11.4* 11.0* 9.5 8.6* 8.5*   Liver Function Tests:  Recent Labs Lab 01/30/16 0908 02/02/16 0409 02/04/16 1113 02/05/16 0436  AST 38 32 29 34  ALT 14 13* 14 17  ALKPHOS 66 54 63 69  BILITOT 0.6 0.5 0.4 0.5  PROT 6.5 5.0* 5.0* 5.5*  ALBUMIN 3.1* 2.3* 2.2* 2.4*   No results for input(s): LIPASE, AMYLASE in the last 168 hours. No results for input(s): AMMONIA in the last 168 hours. CBC:  Recent Labs Lab 02/01/16 0350 02/02/16 0409 02/03/16 0439 02/04/16 0611 02/05/16 0436  WBC 4.3 4.6 4.4 4.4 4.8  HGB 8.9* 8.8* 9.5* 8.4* 8.5*  HCT 27.9* 27.7* 30.4* 26.3* 27.4*  MCV 93.3 93.9 94.7 94.9 95.5  PLT 104* 104* 135* 120* 149*   Cardiac Enzymes:  Recent Labs Lab 01/30/16 1648 01/30/16 2153 01/31/16 0330  TROPONINI 0.09* 0.10* 0.12*   BNP: BNP (last 3 results) No results for input(s): BNP in the last 8760  hours.  ProBNP (last 3 results) No results for input(s): PROBNP in the last 8760 hours.  CBG: No results for input(s): GLUCAP in the last 168 hours.     SignedDomenic Polite MD.  [facility] 02/05/2016, 1:25 PM

## 2016-02-06 LAB — PTH-RELATED PEPTIDE: PTH-related peptide: <1.1 pmol/L

## 2016-05-29 DEATH — deceased
